# Patient Record
Sex: Male | Born: 1985 | Race: White | Hispanic: No | Marital: Married | Smoking: Never smoker
Health system: Southern US, Community
[De-identification: ages and names within clinical notes are randomized; demographics above are authoritative.]

## PROBLEM LIST (undated history)

## (undated) DIAGNOSIS — Z970 Presence of artificial eye: Secondary | ICD-10-CM

## (undated) DIAGNOSIS — F3181 Bipolar II disorder: Secondary | ICD-10-CM

## (undated) DIAGNOSIS — F32A Depression, unspecified: Secondary | ICD-10-CM

## (undated) DIAGNOSIS — R519 Headache, unspecified: Secondary | ICD-10-CM

## (undated) DIAGNOSIS — F419 Anxiety disorder, unspecified: Secondary | ICD-10-CM

## (undated) DIAGNOSIS — Z98811 Dental restoration status: Secondary | ICD-10-CM

## (undated) DIAGNOSIS — Z8711 Personal history of peptic ulcer disease: Secondary | ICD-10-CM

## (undated) DIAGNOSIS — Z8719 Personal history of other diseases of the digestive system: Secondary | ICD-10-CM

## (undated) DIAGNOSIS — T8859XA Other complications of anesthesia, initial encounter: Secondary | ICD-10-CM

## (undated) DIAGNOSIS — M654 Radial styloid tenosynovitis [de Quervain]: Secondary | ICD-10-CM

## (undated) DIAGNOSIS — F909 Attention-deficit hyperactivity disorder, unspecified type: Secondary | ICD-10-CM

## (undated) HISTORY — DX: Anxiety disorder, unspecified: F41.9

## (undated) HISTORY — PX: VASECTOMY: SHX75

## (undated) HISTORY — DX: Presence of artificial eye: Z97.0

## (undated) HISTORY — PX: BLEPHAROPLASTY: SUR158

## (undated) HISTORY — DX: Depression, unspecified: F32.A

---

## 2006-04-30 DIAGNOSIS — Z9889 Other specified postprocedural states: Secondary | ICD-10-CM | POA: Insufficient documentation

## 2006-04-30 HISTORY — PX: BUNIONECTOMY: SHX129

## 2013-10-23 DIAGNOSIS — F909 Attention-deficit hyperactivity disorder, unspecified type: Secondary | ICD-10-CM | POA: Insufficient documentation

## 2013-10-27 DIAGNOSIS — Z4420 Encounter for fitting and adjustment of artificial eye, unspecified: Secondary | ICD-10-CM | POA: Insufficient documentation

## 2015-08-02 DIAGNOSIS — Z0000 Encounter for general adult medical examination without abnormal findings: Secondary | ICD-10-CM | POA: Diagnosis not present

## 2015-08-02 DIAGNOSIS — Z13220 Encounter for screening for lipoid disorders: Secondary | ICD-10-CM | POA: Diagnosis not present

## 2015-08-02 DIAGNOSIS — M5412 Radiculopathy, cervical region: Secondary | ICD-10-CM | POA: Diagnosis not present

## 2015-08-02 DIAGNOSIS — K648 Other hemorrhoids: Secondary | ICD-10-CM | POA: Diagnosis not present

## 2015-08-02 DIAGNOSIS — N419 Inflammatory disease of prostate, unspecified: Secondary | ICD-10-CM | POA: Diagnosis not present

## 2015-08-11 DIAGNOSIS — D225 Melanocytic nevi of trunk: Secondary | ICD-10-CM | POA: Diagnosis not present

## 2015-08-19 DIAGNOSIS — M9901 Segmental and somatic dysfunction of cervical region: Secondary | ICD-10-CM | POA: Diagnosis not present

## 2015-08-19 DIAGNOSIS — M546 Pain in thoracic spine: Secondary | ICD-10-CM | POA: Diagnosis not present

## 2015-08-19 DIAGNOSIS — M542 Cervicalgia: Secondary | ICD-10-CM | POA: Diagnosis not present

## 2015-08-19 DIAGNOSIS — G44219 Episodic tension-type headache, not intractable: Secondary | ICD-10-CM | POA: Diagnosis not present

## 2015-08-20 DIAGNOSIS — G44219 Episodic tension-type headache, not intractable: Secondary | ICD-10-CM | POA: Diagnosis not present

## 2015-08-20 DIAGNOSIS — M9901 Segmental and somatic dysfunction of cervical region: Secondary | ICD-10-CM | POA: Diagnosis not present

## 2015-08-20 DIAGNOSIS — M542 Cervicalgia: Secondary | ICD-10-CM | POA: Diagnosis not present

## 2015-08-20 DIAGNOSIS — M546 Pain in thoracic spine: Secondary | ICD-10-CM | POA: Diagnosis not present

## 2015-08-22 DIAGNOSIS — M9901 Segmental and somatic dysfunction of cervical region: Secondary | ICD-10-CM | POA: Diagnosis not present

## 2015-08-22 DIAGNOSIS — M542 Cervicalgia: Secondary | ICD-10-CM | POA: Diagnosis not present

## 2015-08-22 DIAGNOSIS — M546 Pain in thoracic spine: Secondary | ICD-10-CM | POA: Diagnosis not present

## 2015-08-22 DIAGNOSIS — G44219 Episodic tension-type headache, not intractable: Secondary | ICD-10-CM | POA: Diagnosis not present

## 2015-08-23 DIAGNOSIS — G44219 Episodic tension-type headache, not intractable: Secondary | ICD-10-CM | POA: Diagnosis not present

## 2015-08-23 DIAGNOSIS — M546 Pain in thoracic spine: Secondary | ICD-10-CM | POA: Diagnosis not present

## 2015-08-23 DIAGNOSIS — M9901 Segmental and somatic dysfunction of cervical region: Secondary | ICD-10-CM | POA: Diagnosis not present

## 2015-08-23 DIAGNOSIS — M542 Cervicalgia: Secondary | ICD-10-CM | POA: Diagnosis not present

## 2015-08-29 DIAGNOSIS — M9901 Segmental and somatic dysfunction of cervical region: Secondary | ICD-10-CM | POA: Diagnosis not present

## 2015-08-29 DIAGNOSIS — M542 Cervicalgia: Secondary | ICD-10-CM | POA: Diagnosis not present

## 2015-08-29 DIAGNOSIS — M546 Pain in thoracic spine: Secondary | ICD-10-CM | POA: Diagnosis not present

## 2015-08-29 DIAGNOSIS — G44219 Episodic tension-type headache, not intractable: Secondary | ICD-10-CM | POA: Diagnosis not present

## 2015-08-30 DIAGNOSIS — M9901 Segmental and somatic dysfunction of cervical region: Secondary | ICD-10-CM | POA: Diagnosis not present

## 2015-08-30 DIAGNOSIS — M542 Cervicalgia: Secondary | ICD-10-CM | POA: Diagnosis not present

## 2015-08-30 DIAGNOSIS — M546 Pain in thoracic spine: Secondary | ICD-10-CM | POA: Diagnosis not present

## 2015-08-30 DIAGNOSIS — G44219 Episodic tension-type headache, not intractable: Secondary | ICD-10-CM | POA: Diagnosis not present

## 2015-08-31 DIAGNOSIS — G44219 Episodic tension-type headache, not intractable: Secondary | ICD-10-CM | POA: Diagnosis not present

## 2015-08-31 DIAGNOSIS — M542 Cervicalgia: Secondary | ICD-10-CM | POA: Diagnosis not present

## 2015-08-31 DIAGNOSIS — M546 Pain in thoracic spine: Secondary | ICD-10-CM | POA: Diagnosis not present

## 2015-08-31 DIAGNOSIS — M9901 Segmental and somatic dysfunction of cervical region: Secondary | ICD-10-CM | POA: Diagnosis not present

## 2015-09-07 DIAGNOSIS — M438X2 Other specified deforming dorsopathies, cervical region: Secondary | ICD-10-CM | POA: Diagnosis not present

## 2015-09-07 DIAGNOSIS — M9901 Segmental and somatic dysfunction of cervical region: Secondary | ICD-10-CM | POA: Diagnosis not present

## 2015-09-07 DIAGNOSIS — M542 Cervicalgia: Secondary | ICD-10-CM | POA: Diagnosis not present

## 2015-09-07 DIAGNOSIS — M530 Cervicocranial syndrome: Secondary | ICD-10-CM | POA: Diagnosis not present

## 2015-09-08 DIAGNOSIS — M438X2 Other specified deforming dorsopathies, cervical region: Secondary | ICD-10-CM | POA: Diagnosis not present

## 2015-09-08 DIAGNOSIS — M542 Cervicalgia: Secondary | ICD-10-CM | POA: Diagnosis not present

## 2015-09-08 DIAGNOSIS — M9901 Segmental and somatic dysfunction of cervical region: Secondary | ICD-10-CM | POA: Diagnosis not present

## 2015-09-08 DIAGNOSIS — M530 Cervicocranial syndrome: Secondary | ICD-10-CM | POA: Diagnosis not present

## 2016-06-28 DIAGNOSIS — R41840 Attention and concentration deficit: Secondary | ICD-10-CM | POA: Diagnosis not present

## 2016-06-28 DIAGNOSIS — F902 Attention-deficit hyperactivity disorder, combined type: Secondary | ICD-10-CM | POA: Diagnosis not present

## 2016-06-28 DIAGNOSIS — Z79899 Other long term (current) drug therapy: Secondary | ICD-10-CM | POA: Diagnosis not present

## 2016-06-28 DIAGNOSIS — F419 Anxiety disorder, unspecified: Secondary | ICD-10-CM | POA: Diagnosis not present

## 2016-08-23 DIAGNOSIS — Z79899 Other long term (current) drug therapy: Secondary | ICD-10-CM | POA: Diagnosis not present

## 2016-08-23 DIAGNOSIS — R41840 Attention and concentration deficit: Secondary | ICD-10-CM | POA: Diagnosis not present

## 2016-10-21 DIAGNOSIS — L259 Unspecified contact dermatitis, unspecified cause: Secondary | ICD-10-CM | POA: Diagnosis not present

## 2016-11-21 DIAGNOSIS — F902 Attention-deficit hyperactivity disorder, combined type: Secondary | ICD-10-CM | POA: Diagnosis not present

## 2016-11-21 DIAGNOSIS — Z79899 Other long term (current) drug therapy: Secondary | ICD-10-CM | POA: Diagnosis not present

## 2016-12-07 DIAGNOSIS — H02122 Mechanical ectropion of right lower eyelid: Secondary | ICD-10-CM | POA: Diagnosis not present

## 2016-12-17 DIAGNOSIS — F321 Major depressive disorder, single episode, moderate: Secondary | ICD-10-CM | POA: Diagnosis not present

## 2017-01-03 DIAGNOSIS — Z4421 Encounter for fitting and adjustment of artificial right eye: Secondary | ICD-10-CM | POA: Diagnosis not present

## 2017-01-18 DIAGNOSIS — H02132 Senile ectropion of right lower eyelid: Secondary | ICD-10-CM | POA: Diagnosis not present

## 2017-01-18 DIAGNOSIS — H02122 Mechanical ectropion of right lower eyelid: Secondary | ICD-10-CM | POA: Diagnosis not present

## 2017-02-07 ENCOUNTER — Encounter: Payer: Self-pay | Admitting: Physician Assistant

## 2017-02-07 ENCOUNTER — Ambulatory Visit (INDEPENDENT_AMBULATORY_CARE_PROVIDER_SITE_OTHER): Payer: BLUE CROSS/BLUE SHIELD | Admitting: Physician Assistant

## 2017-02-07 VITALS — BP 120/80 | HR 80 | Temp 98.3°F | Ht 68.0 in | Wt 198.2 lb

## 2017-02-07 DIAGNOSIS — R1032 Left lower quadrant pain: Secondary | ICD-10-CM | POA: Diagnosis not present

## 2017-02-07 DIAGNOSIS — Z114 Encounter for screening for human immunodeficiency virus [HIV]: Secondary | ICD-10-CM

## 2017-02-07 DIAGNOSIS — R1030 Lower abdominal pain, unspecified: Secondary | ICD-10-CM | POA: Diagnosis not present

## 2017-02-07 LAB — CBC WITH DIFFERENTIAL/PLATELET
BASOS ABS: 0 10*3/uL (ref 0.0–0.1)
BASOS PCT: 0.4 % (ref 0.0–3.0)
EOS ABS: 0.2 10*3/uL (ref 0.0–0.7)
Eosinophils Relative: 2.8 % (ref 0.0–5.0)
HCT: 45 % (ref 39.0–52.0)
Hemoglobin: 15.4 g/dL (ref 13.0–17.0)
Lymphocytes Relative: 36.5 % (ref 12.0–46.0)
Lymphs Abs: 2.2 10*3/uL (ref 0.7–4.0)
MCHC: 34.2 g/dL (ref 30.0–36.0)
MCV: 84.1 fl (ref 78.0–100.0)
Monocytes Absolute: 0.5 10*3/uL (ref 0.1–1.0)
Monocytes Relative: 7.8 % (ref 3.0–12.0)
NEUTROS ABS: 3.2 10*3/uL (ref 1.4–7.7)
NEUTROS PCT: 52.5 % (ref 43.0–77.0)
PLATELETS: 256 10*3/uL (ref 150.0–400.0)
RBC: 5.35 Mil/uL (ref 4.22–5.81)
RDW: 13.2 % (ref 11.5–15.5)
WBC: 6.1 10*3/uL (ref 4.0–10.5)

## 2017-02-07 LAB — URINALYSIS, ROUTINE W REFLEX MICROSCOPIC
BILIRUBIN URINE: NEGATIVE
Hgb urine dipstick: NEGATIVE
KETONES UR: NEGATIVE
LEUKOCYTES UA: NEGATIVE
Nitrite: NEGATIVE
PH: 7 (ref 5.0–8.0)
RBC / HPF: NONE SEEN (ref 0–?)
TOTAL PROTEIN, URINE-UPE24: NEGATIVE
UROBILINOGEN UA: 0.2 (ref 0.0–1.0)
Urine Glucose: NEGATIVE
WBC, UA: NONE SEEN (ref 0–?)

## 2017-02-07 LAB — COMPREHENSIVE METABOLIC PANEL
ALT: 30 U/L (ref 0–53)
AST: 22 U/L (ref 0–37)
Albumin: 4.9 g/dL (ref 3.5–5.2)
Alkaline Phosphatase: 46 U/L (ref 39–117)
BILIRUBIN TOTAL: 0.7 mg/dL (ref 0.2–1.2)
BUN: 12 mg/dL (ref 6–23)
CALCIUM: 10 mg/dL (ref 8.4–10.5)
CHLORIDE: 103 meq/L (ref 96–112)
CO2: 29 meq/L (ref 19–32)
CREATININE: 1.15 mg/dL (ref 0.40–1.50)
GFR: 77.7 mL/min (ref >60.00)
GLUCOSE: 98 mg/dL (ref 70–99)
Potassium: 4.2 mEq/L (ref 3.5–5.1)
Sodium: 138 mEq/L (ref 135–145)
Total Protein: 7.6 g/dL (ref 6.0–8.3)

## 2017-02-07 MED ORDER — CIPROFLOXACIN HCL 500 MG PO TABS: 500.0000 mg | ORAL_TABLET | Freq: Two times a day (BID) | ORAL | 0 refills | Status: AC

## 2017-02-07 MED ORDER — METRONIDAZOLE 500 MG PO TABS
500.0000 mg | ORAL_TABLET | Freq: Three times a day (TID) | ORAL | 0 refills | Status: AC
Start: 2017-02-07 — End: 2017-02-17
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
## 2017-02-07 NOTE — Patient Instructions (Addendum)
It was great to meet you!  We will call you with your lab results.  Start the antibiotics. Avoid alcohol while on these antibiotics.  If you change your mind and would like imaging (CT scan), please let us know.   Diverticulitis Diverticulitis is inflammation or infection of small pouches in your colon that form when you have a condition called diverticulosis. The pouches in your colon are called diverticula. Your colon, or large intestine, is where water is absorbed and stool is formed. Complications of diverticulitis can include:  Bleeding.  Severe infection.  Severe pain.  Perforation of your colon.  Obstruction of your colon.  What are the causes? Diverticulitis is caused by bacteria. Diverticulitis happens when stool becomes trapped in diverticula. This allows bacteria to grow in the diverticula, which can lead to inflammation and infection. What increases the risk? People with diverticulosis are at risk for diverticulitis. Eating a diet that does not include enough fiber from fruits and vegetables may make diverticulitis more likely to develop. What are the signs or symptoms? Symptoms of diverticulitis may include:  Abdominal pain and tenderness. The pain is normally located on the left side of the abdomen, but may occur in other areas.  Fever and chills.  Bloating.  Cramping.  Nausea.  Vomiting.  Constipation.  Diarrhea.  Blood in your stool.  How is this diagnosed? Your health care provider will ask you about your medical history and do a physical exam. You may need to have tests done because many medical conditions can cause the same symptoms as diverticulitis. Tests may include:  Blood tests.  Urine tests.  Imaging tests of the abdomen, including X-rays and CT scans.  When your condition is under control, your health care provider may recommend that you have a colonoscopy. A colonoscopy can show how severe your diverticula are and whether something  else is causing your symptoms. How is this treated? Most cases of diverticulitis are mild and can be treated at home. Treatment may include:  Taking over-the-counter pain medicines.  Following a clear liquid diet.  Taking antibiotic medicines by mouth for 7-10 days.  More severe cases may be treated at a hospital. Treatment may include:  Not eating or drinking.  Taking prescription pain medicine.  Receiving antibiotic medicines through an IV tube.  Receiving fluids and nutrition through an IV tube.  Surgery.  Follow these instructions at home:  Follow your health care provider's instructions carefully.  Follow a full liquid diet or other diet as directed by your health care provider. After your symptoms improve, your health care provider may tell you to change your diet. He or she may recommend you eat a high-fiber diet. Fruits and vegetables are good sources of fiber. Fiber makes it easier to pass stool.  Take fiber supplements or probiotics as directed by your health care provider.  Only take medicines as directed by your health care provider.  Keep all your follow-up appointments. Contact a health care provider if:  Your pain does not improve.  You have a hard time eating food.  Your bowel movements do not return to normal. Get help right away if:  Your pain becomes worse.  Your symptoms do not get better.  Your symptoms suddenly get worse.  You have a fever.  You have repeated vomiting.  You have bloody or black, tarry stools. This information is not intended to replace advice given to you by your health care provider. Make sure you discuss any questions you have with  your health care provider. Document Released: 01/24/2005 Document Revised: 09/22/2015 Document Reviewed: 03/11/2013 Elsevier Interactive Patient Education  2017 [employer].

## 2017-02-09 LAB — HIV ANTIBODY (ROUTINE TESTING W REFLEX): HIV 1&2 Ab, 4th Generation: NONREACTIVE

## 2017-02-11 ENCOUNTER — Telehealth: Payer: Self-pay | Admitting: *Deleted

## 2017-02-11 ENCOUNTER — Telehealth: Payer: Self-pay | Admitting: Physician Assistant

## 2017-02-11 NOTE — Telephone Encounter (Signed)
Patient called in reference to ciprofloxacin (CIPRO) 500 MG tablet and metroNIDAZOLE (FLAGYL) 500 MG tablet. Patient stated he is having side affects of not being able to sleep and also having diarrhea. Patient would like to know if he can take probiotics and sleeping meds with these as well or if they will interact. Please call patient and advise. Not ok to leave message on contact number.

## 2017-02-11 NOTE — Telephone Encounter (Signed)
Patient called and stated that Lelon Mast mention at his last appt about imaging. Patient states he is now interesting in imaging. He states he can be called when that appt is set up for radiology.  Patient is requesting a call back. His call back number is [phone]. Please advise. Thank you

## 2017-02-11 NOTE — Telephone Encounter (Signed)
Please see message and advise 

## 2017-02-11 NOTE — Telephone Encounter (Signed)
Please call patient and let him know that I have put in the CT scan order.  Also, please let him know I discussed with Dr. Earlene Plater and he can use immodium sparingly if needed. I do not recommend exceeding 16 mg/d. If any severe pain develops with use of immodium, please go to ER.  Jarold Motto PA-C 02/11/17

## 2017-02-11 NOTE — Telephone Encounter (Signed)
Probiotics are okay to take.  What sleeping meds is he wanting to take? (OTC sleep aid such as melatonin or benadryl is okay.)  Jarold Motto PA-C

## 2017-02-11 NOTE — Telephone Encounter (Signed)
Spoke to pt, told him Probiotics are okay but in regards to sleep aid OTC Melatonin and Benadryl is okay per [place]. Pt verbalized understanding and wanted to know if Unisom was okay and can he take Imodium? Told pt discussed with Lelon Mast and she said Unisom is okay but do not take Imodium she does not want you to get constipated need to push fluids. Pt verbalized understanding.

## 2017-02-11 NOTE — Telephone Encounter (Signed)
Left detailed message on personal voicemail, Mark Fritz ordered CT scan and someone will be contacting to schedule appt. Also, Mark Fritz discussed with Dr. Earlene Plater medication side effects and you can use immodium sparingly if needed. She does not recommend exceeding 16 mg/d. If any severe pain develops with use of immodium, please go to ER. Any questions please call the office.

## 2017-02-11 NOTE — Addendum Note (Signed)
Addended by: Haynes Bast on: 02/11/2017 11:45 AM   Modules accepted: Orders

## 2017-02-22 DIAGNOSIS — Z79899 Other long term (current) drug therapy: Secondary | ICD-10-CM | POA: Diagnosis not present

## 2017-02-22 DIAGNOSIS — F902 Attention-deficit hyperactivity disorder, combined type: Secondary | ICD-10-CM | POA: Diagnosis not present

## 2017-03-04 ENCOUNTER — Telehealth: Payer: Self-pay | Admitting: Physician Assistant

## 2017-03-04 NOTE — Telephone Encounter (Signed)
[facility] called in reference to authorization needed for the patient before their appointment on 03/06/17. I transferred the call to [facility]ic[facility] to advise. Mellody DanceKeith please call to give the PA.

## 2017-03-05 NOTE — Telephone Encounter (Signed)
Prior auth obtained and noted in referral. No further action needed.

## 2017-03-06 ENCOUNTER — Ambulatory Visit
Admission: RE | Admit: 2017-03-06 | Discharge: 2017-03-06 | Disposition: A | Discharge status: Home / Self Care | Payer: BLUE CROSS/BLUE SHIELD | Source: Ambulatory Visit | Attending: Physician Assistant | Admitting: Physician Assistant

## 2017-03-06 DIAGNOSIS — R590 Localized enlarged lymph nodes: Secondary | ICD-10-CM | POA: Diagnosis not present

## 2017-03-06 DIAGNOSIS — R1032 Left lower quadrant pain: Secondary | ICD-10-CM

## 2017-03-06 MED ORDER — IOPAMIDOL (ISOVUE-300) INJECTION 61%
100.0000 mL | Freq: Once | INTRAVENOUS | Status: AC | PRN
  Administered 2017-03-06: 100 mL via INTRAVENOUS

## 2017-03-07 ENCOUNTER — Encounter: Payer: Self-pay | Admitting: Physician Assistant

## 2017-03-07 DIAGNOSIS — I880 Nonspecific mesenteric lymphadenitis: Secondary | ICD-10-CM | POA: Insufficient documentation

## 2017-03-07 DIAGNOSIS — K5904 Chronic idiopathic constipation: Secondary | ICD-10-CM | POA: Insufficient documentation

## 2017-03-07 DIAGNOSIS — K5900 Constipation, unspecified: Secondary | ICD-10-CM | POA: Insufficient documentation

## 2017-03-19 ENCOUNTER — Encounter: Payer: Self-pay | Admitting: Sports Medicine

## 2017-03-19 ENCOUNTER — Ambulatory Visit (INDEPENDENT_AMBULATORY_CARE_PROVIDER_SITE_OTHER): Payer: BLUE CROSS/BLUE SHIELD | Admitting: Sports Medicine

## 2017-03-19 ENCOUNTER — Ambulatory Visit: Payer: Self-pay

## 2017-03-19 VITALS — BP 116/74 | HR 80 | Ht 68.0 in | Wt 198.4 lb

## 2017-03-19 DIAGNOSIS — M25531 Pain in right wrist: Secondary | ICD-10-CM

## 2017-03-19 DIAGNOSIS — M654 Radial styloid tenosynovitis [de Quervain]: Secondary | ICD-10-CM | POA: Insufficient documentation

## 2017-03-19 NOTE — Patient Instructions (Signed)

## 2017-03-19 NOTE — Assessment & Plan Note (Signed)
Injection today.  Continue with thumb spica splinting he did have a slight linear tear that likely contributed to this but no significant subluxation.  Avoid exacerbating activities and will plan for repeat ultrasound at follow-up to ensure clinical improvement.

## 2017-03-19 NOTE — Procedures (Signed)
PROCEDURE NOTE -  ULTRASOUND GUIDEDINJECTION: Right first dorsal compartment injection Images were obtained and interpreted by myself, Gaspar BiddingMichael Johncarlo Maalouf, DO  Images have been saved and stored to PACS system. Images obtained on: GE S7 Ultrasound machine  ULTRASOUND FINDINGS: Marked halo sign of the first dorsal compartment.  Tendon does have glide remaining however it is slightly dysfunctional.  DESCRIPTION OF PROCEDURE:  The patient's clinical condition is marked by substantial pain and/or significant functional disability. Other conservative therapy has not provided relief, is contraindicated, or not appropriate. There is a reasonable likelihood that injection will significantly improve the patient's pain and/or functional impairment. After discussing the risks, benefits and expected outcomes of the injection and all questions were reviewed and answered, the patient wished to undergo the above named procedure. Verbal consent was obtained. The ultrasound was used to identify the target structure and adjacent neurovascular structures. The skin was then prepped in sterile fashion and the target structure was injected under direct visualization using sterile technique as below: PREP: Alcohol, Ethel Chloride APPROACH: Direct inplane, single injection, 25g 1.5" needle INJECTATE: 0.5cc 1% lidocaine, 0.5cc 0.5% marcaine, 0.5cc 40mg  DepoMedrol ASPIRATE: N/A DRESSING: Band-Aid and the patient's own thumb spica brace  Post procedural instructions including recommending icing and warning signs for infection were reviewed. This procedure was well tolerated and there were no complications.   IMPRESSION: Succesful US Guided Injection

## 2017-03-19 NOTE — Progress Notes (Signed)
OFFICE VISIT NOTE Mark FellsMichael D. Delorise Shinerigby, DO  [facility] [place] [phone]  Mark Fritz - 31 y.o. male MRN [id number]  Date of birth: [birth year]  Visit Date: 03/19/2017  PCP: Mark MottoWorley, Samantha, PA   Referred by: Mark Fritz, [place]  Mark PierceMolly A Fritz PT, LAT, ATC acting as scribe for Dr. Berline Choughigby.  SUBJECTIVE:   Chief Complaint  Patient presents with  . New Patient (Initial Visit)    R wrist pain   HPI: As below and per problem based documentation when appropriate.  Mr. Mark Fritz is a new pt presenting today w/ c/o R wrist pain.  Pt states that his R wrist has been bothering him for a while, approximately one month.  He states that the pain began on the ulnar side of his wrist but now is bothering him more on the radial side.  Pt is currently wearing a wrist brace and he has been wearing that intermittently.  He notes that the wrist pain was exacerbated after [place]/ [place] a bunch of wood at his house.  Pt states that in addition to the brace he has tried ice and Advil which have also helped w/ the pain but not completely eliminated it.  Pt states that he currently has no pain in his R wrist but notes that, at it's worst, it is a sharp 8/10.  He also reports some popping and grinding in his R wrist.  Pt states that he is concerned about the possibility of deQuervain's tenosynovitis due to his dad having this and his symptoms sounding similar.      Review of Systems  Constitutional: Negative for chills and fever.  HENT: Negative.   Eyes: Negative.   Respiratory: Positive for cough. Negative for shortness of breath and wheezing.   Cardiovascular: Negative for chest pain and palpitations.  Gastrointestinal: Positive for heartburn. Negative for nausea.  Musculoskeletal: Positive for falls and joint pain.  Neurological: Negative for dizziness, tingling and headaches.  Endo/Heme/Allergies: Does not  bruise/bleed easily.  Psychiatric/Behavioral: Negative for depression. The patient is not nervous/anxious and does not have insomnia.     Otherwise per HPI.  HISTORY & PERTINENT PRIOR DATA:  No specialty comments available. He reports that  has never smoked. he has never used smokeless tobacco. No results for input(s): HGBA1C, LABURIC, CREATINE in the last 8760 hours.  Invalid input(s): CR Patient Active Problem List   Diagnosis Date Noted  . De Quervain's tenosynovitis, right 03/19/2017  . Mesenteric adenitis 03/07/2017  . Constipation 03/07/2017  . Fitting or adjustment of artificial eye 10/27/2013  . ADHD (attention deficit hyperactivity disorder) 10/23/2013   Past Medical History:  Diagnosis Date  . Frequent headaches   . GERD (gastroesophageal reflux disease)   . Hx of multiple concussions    x 2  . Prosthetic Left eye globe    Birth defect   Family History  Problem Relation Age of Onset  . Depression Mother   . Diabetes Mother   . Miscarriages / [place] Mother   . Hearing loss Father   . Hypertension Father   . Mental illness Maternal Grandmother   . Hypertension Paternal Grandfather   . Stroke Paternal Grandfather   . Colon cancer Neg Hx   . Prostate cancer Neg Hx    Past Surgical History:  Procedure Laterality Date  . BUNIONECTOMY Left 2008  . eyelid surugery Left 12/2016   Social History  Occupational History  . Not on file  Tobacco Use  . Smoking status: Never Smoker  . Smokeless tobacco: Never Used  Substance and Sexual Activity  . Alcohol use: No  . Drug use: No  . Sexual activity: Yes   No Known Allergies Outpatient Medications Prior to Visit  Medication Sig Dispense Refill  . EVEKEO 10 MG TABS Take 0.5-1 tablets by mouth daily.     Marland Kitchen ibuprofen (ADVIL,MOTRIN) 200 MG tablet Take 400 mg 3 (three) times daily by mouth.    Marland Kitchen acetaminophen (TYLENOL) 500 MG tablet Take 1,000 mg by mouth as needed.     No facility-administered medications prior  to visit.      OBJECTIVE:  VS:  HT:5\' 8"  (172.7 cm)   WT:198 lb 6.4 oz (90 kg)  BMI:30.17    BP:116/74  HR:80bpm  TEMP: ( )  RESP:96 % EXAM: Findings:  WDWN, NAD, Non-toxic appearing Alert & appropriately interactive Not depressed or anxious appearing No increased work of breathing. Pupils are equal. EOM intact without nystagmus No clubbing or cyanosis of the extremities appreciated No significant rashes/lesions/ulcerations overlying the examined area. Radial pulses 2+/4.  No significant generalized UE edema. Sensation intact to light touch in upper extremities.  Left wrist: Overall well aligned.  No significant deformity.  He does have pain over the first dorsal compartment.  Grip strength is intact.  Pain with [name] testing.  Overall good range of motion.  No significant anatomic snuffbox tenderness.    RADIOLOGY: 03/19/2017: First dorsal compartment injection, right wrist ASSESSMENT & PLAN:     ICD-10-CM   1. Right wrist pain M25.531 [place] LIMITED JOINT SPACE STRUCTURES UP RIGHT(NO LINKED CHARGES)  2. De Quervain's tenosynovitis, right M65.4    ================================================================= No problem-specific Assessment & Plan notes found for this encounter.  PROCEDURE NOTE -  ULTRASOUND GUIDEDINJECTION: Right first dorsal compartment injection Images were obtained and interpreted by myself, Gaspar Bidding, DO  Images have been saved and stored to PACS system. Images obtained on: GE S7 Ultrasound machine  ULTRASOUND FINDINGS: Marked halo sign of the first dorsal compartment.  Tendon does have glide remaining however it is slightly dysfunctional.  DESCRIPTION OF PROCEDURE:  The patient's clinical condition is marked by substantial pain and/or significant functional disability. Other conservative therapy has not provided relief, is contraindicated, or not appropriate. There is a reasonable likelihood that injection will significantly improve  the patient's pain and/or functional impairment. After discussing the risks, benefits and expected outcomes of the injection and all questions were reviewed and answered, the patient wished to undergo the above named procedure. Verbal consent was obtained. The ultrasound was used to identify the target structure and adjacent neurovascular structures. The skin was then prepped in sterile fashion and the target structure was injected under direct visualization using sterile technique as below: PREP: Alcohol, Ethel Chloride APPROACH: Direct inplane, single injection, 25g 1.5" needle INJECTATE: 0.5cc 1% lidocaine, 0.5cc 0.5% marcaine, 0.5cc 40mg  DepoMedrol ASPIRATE: N/A DRESSING: Band-Aid and the patient's own thumb spica brace  Post procedural instructions including recommending icing and warning signs for infection were reviewed. This procedure was well tolerated and there were no complications.   IMPRESSION: Succesful US Guided Injection   ================================================================= Patient Instructions  You had an injection today.  Things to be aware of after injection are listed below: . You may experience no significant improvement or even a slight worsening in your symptoms during the first 24 to 48 hours.  After that we expect your symptoms to improve  gradually over the next 2 weeks for the medicine to have its maximal effect.  You should continue to have improvement out to 6 weeks after your injection. . Dr. Berline Choughigby recommends icing the site of the injection for 20 minutes  1-2 times the day of your injection . You may shower but no swimming, tub bath or Jacuzzi for 24 hours. . If your bandage falls off this does not need to be replaced.  It is appropriate to remove the bandage after 4 hours. . You may resume light activities as tolerated unless otherwise directed per Dr. Berline Choughigby during your visit  POSSIBLE STEROID SIDE EFFECTS:  Side effects from injectable steroids  tend to be less than when taken orally however you may experience some of the symptoms listed below.  If experienced these should only last for a short period of time. Change in menstrual flow  Edema (swelling)  Increased appetite Skin flushing (redness)  Skin rash/acne  Thrush (oral) Yeast vaginitis    Increased sweating  Depression Increased blood glucose levels Cramping and leg/calf  Euphoria (feeling happy)  POSSIBLE PROCEDURE SIDE EFFECTS: The side effects of the injection are usually fairly minimal however if you may experience some of the following side effects that are usually self-limited and will is off on their own.  If you are concerned please feel free to call the office with questions:  Increased numbness or tingling  Nausea or vomiting  Swelling or bruising at the injection site   Please call our office if if you experience any of the following symptoms over the next week as these can be signs of infection:   Fever greater than 100.68F  Significant swelling at the injection site  Significant redness or drainage from the injection site  If after 2 weeks you are continuing to have worsening symptoms please call our office to discuss what the next appropriate actions should be including the potential for a return office visit or other diagnostic testing.     =================================================================  Follow-up: Return in about 6 weeks (around 04/30/2017).   CMA/ATC served as Neurosurgeonscribe during this visit. History, Physical, and Plan performed by medical provider. Documentation and orders reviewed and attested to.      Gaspar BiddingMichael Illa Enlow, DO    [facility]

## 2017-05-01 ENCOUNTER — Ambulatory Visit (INDEPENDENT_AMBULATORY_CARE_PROVIDER_SITE_OTHER): Payer: BLUE CROSS/BLUE SHIELD | Admitting: Sports Medicine

## 2017-05-01 ENCOUNTER — Ambulatory Visit: Payer: Self-pay

## 2017-05-01 ENCOUNTER — Encounter: Payer: Self-pay | Admitting: Sports Medicine

## 2017-05-01 VITALS — BP 120/80 | HR 78 | Ht 68.0 in | Wt 199.8 lb

## 2017-05-01 DIAGNOSIS — M654 Radial styloid tenosynovitis [de Quervain]: Secondary | ICD-10-CM

## 2017-05-01 MED ORDER — DICLOFENAC SODIUM 2 % TD SOLN: 1.0000 "application " | Freq: Two times a day (BID) | TRANSDERMAL | 2 refills | Status: DC

## 2017-05-01 MED ORDER — DICLOFENAC SODIUM 2 % TD SOLN: 1.0000 "application " | Freq: Two times a day (BID) | TRANSDERMAL | 0 refills | Status: AC

## 2017-05-01 NOTE — Progress Notes (Addendum)
Mark Fritz. [name] [phone]  [name] - 32 y.o. male MRN [id number]  Date of birth: 1985-05-05  Visit Date: 05/01/2017  PCP: Jarold Motto, PA   Referred by: Jarold Motto, [place]   Scribe for today's visit: Stevenson Clinch, CMA     SUBJECTIVE:  Mark Fritz is here for Follow-up [name] Quarvains tenosynovitis)  Compared to the last office visit, his previously described symptoms are improving, occasional pain or "twinge" with overuse, otherwise doing f=very well.  Current symptoms are mild & are nonradiating He has been icing, wearing wrist brace and taking Advil prn.    ROS Denies night time disturbances. Denies fevers, chills, or night sweats. Denies unexplained weight loss. Denies personal history of cancer. Denies changes in bowel or bladder habits. Denies recent unreported falls. Denies new or worsening dyspnea or wheezing. Denies headaches or dizziness.  Denies numbness, tingling or weakness  In the extremities.  Denies dizziness or presyncopal episodes Denies lower extremity edema     HISTORY & PERTINENT PRIOR DATA:  Prior History reviewed and updated per electronic medical record.  Significant history, findings, studies and interim changes include:  reports that  has never smoked. he has never used smokeless tobacco. No results for input(s): HGBA1C, LABURIC, CREATINE in the last 8760 hours. No specialty comments available. Problem  De Quervain's Tenosynovitis, Right    OBJECTIVE:  VS:  HT:5\' 8"  (172.7 cm)   WT:199 lb 12.8 oz (90.6 kg)  BMI:30.39    BP:120/80  HR:78bpm  TEMP: ( )  RESP:95 %   HISTORY & PERTINENT PRIOR DATA:  Prior History reviewed and updated per electronic medical record.  Significant history, findings, studies and interim changes include:  reports that  has never smoked. he has never used smokeless tobacco. No results for input(s): HGBA1C, LABURIC, CREATINE in  the last 8760 hours. No specialty comments available. Problem  De Quervain's Tenosynovitis, Right     OBJECTIVE:  VS:  HT:5\' 8"  (172.7 cm)   WT:199 lb 12.8 oz (90.6 kg)  BMI:30.39    BP:120/80  HR:78bpm  TEMP: ( )  RESP:95 %   PHYSICAL EXAM: Constitutional: WDWN, Non-toxic appearing. Psychiatric: Alert & appropriately interactive. Not depressed or anxious appearing. Respiratory: No increased work of breathing. Trachea Midline Cardiovascular:  Peripheral Pulses: peripheral pulses symmetrical No clubbing or cyanosis appreciated Capillary Refill is normal, less than 2 seconds No signficant generalized edema/anasarca Sensory Exam: intact to light touch   Bilateral upper extremities overall well aligned.  No significant pain with palpation of the first dorsal compartment.  Wrist and thumb range of motion is normal bilaterally.  No significant pain with [name] testing today.  Grip strength is normal.  No thenar atrophy.  No scaphoid pain  ASSESSMENT & PLAN:   1. De Quervain's tenosynovitis, right    PLAN:   De Quervain's tenosynovitis, right Significantly better.  Still having intermittent flareups and does notice that with heavy lifting and seems to significantly worsen but only for short periods of time.  Given small amount of persistent fluid and findings on the left side we will go ahead and start him on Pennsaid and see how he does with this over the next 2 weeks.  We will plan to follow-up with him only as needed at this point given how well he is doing but if any acute flareups or worsening could consider repeat injection.   ++++++++++++++++++++++++++++++++++++++++++++ Orders & Meds: Orders Placed This Encounter  Procedures  . US LIMITED JOINT SPACE STRUCTURES UP RIGHT(NO LINKED CHARGES)    Meds ordered this encounter  Medications  . Diclofenac Sodium (PENNSAID) 2 % SOLN    Sig: Place 1 application onto the skin 2 (two) times daily for 1 day.    Dispense:  8 g     Refill:  0  . Diclofenac Sodium (PENNSAID) 2 % SOLN    Sig: Place 1 application onto the skin 2 (two) times daily.    Dispense:  112 g    Refill:  2    Home Phone      [phone] Work Phone      [phone] Mobile          [phone]     ++++++++++++++++++++++++++++++++++++++++++++ Follow-up: Return if symptoms worsen or fail to improve.   Pertinent documentation may be included in additional procedure notes, imaging studies, problem based documentation and patient instructions. Please see these sections of the encounter for additional information regarding this visit. CMA/ATC served as Neurosurgeonscribe during this visit. History, Physical, and Plan performed by medical provider. Documentation and orders reviewed and attested to.      Andrena MewsMichael D Savannha Welle, DO    [facility]

## 2017-05-01 NOTE — Procedures (Signed)
LIMITED MSK ULTRASOUND OF bilateral wrists Images were obtained and interpreted by myself, Gaspar BiddingMichael Cniyah Sproull, DO  Images have been saved and stored to PACS system. Images obtained on: GE S7 Ultrasound machine  FINDINGS:   Right first dorsal compartment has a small amount of persistent swelling and edema with slight nodularity over the radial styloid  Left first dorsal compartment with a small halo sign that is minimal.  Somewhat limited glide on the left compared to the right which is overall significantly improved.  IMPRESSION:  1. Bilateral de Quervain's tenosynovitis, right greater than left

## 2017-05-01 NOTE — Assessment & Plan Note (Signed)
Significantly better.  Still having intermittent flareups and does notice that with heavy lifting and seems to significantly worsen but only for short periods of time.  Given small amount of persistent fluid and findings on the left side we will go ahead and start him on Pennsaid and see how he does with this over the next 2 weeks.  We will plan to follow-up with him only as needed at this point given how well he is doing but if any acute flareups or worsening could consider repeat injection.

## 2017-05-07 DIAGNOSIS — Z4421 Encounter for fitting and adjustment of artificial right eye: Secondary | ICD-10-CM | POA: Diagnosis not present

## 2017-07-02 DIAGNOSIS — F902 Attention-deficit hyperactivity disorder, combined type: Secondary | ICD-10-CM | POA: Diagnosis not present

## 2017-07-02 DIAGNOSIS — Z79899 Other long term (current) drug therapy: Secondary | ICD-10-CM | POA: Diagnosis not present

## 2017-07-10 ENCOUNTER — Ambulatory Visit (INDEPENDENT_AMBULATORY_CARE_PROVIDER_SITE_OTHER): Payer: BLUE CROSS/BLUE SHIELD

## 2017-07-10 ENCOUNTER — Ambulatory Visit (INDEPENDENT_AMBULATORY_CARE_PROVIDER_SITE_OTHER): Payer: BLUE CROSS/BLUE SHIELD | Admitting: Sports Medicine

## 2017-07-10 ENCOUNTER — Ambulatory Visit: Payer: Self-pay

## 2017-07-10 ENCOUNTER — Encounter: Payer: Self-pay | Admitting: Sports Medicine

## 2017-07-10 VITALS — BP 110/82 | HR 74 | Ht 68.0 in | Wt 195.2 lb

## 2017-07-10 DIAGNOSIS — M654 Radial styloid tenosynovitis [de Quervain]: Secondary | ICD-10-CM | POA: Diagnosis not present

## 2017-07-10 DIAGNOSIS — M1811 Unilateral primary osteoarthritis of first carpometacarpal joint, right hand: Secondary | ICD-10-CM

## 2017-07-10 DIAGNOSIS — M25531 Pain in right wrist: Secondary | ICD-10-CM

## 2017-07-10 LAB — COMPREHENSIVE METABOLIC PANEL
ALBUMIN: 4.8 g/dL (ref 3.5–5.2)
ALT: 30 U/L (ref 0–53)
AST: 21 U/L (ref 0–37)
Alkaline Phosphatase: 49 U/L (ref 39–117)
BILIRUBIN TOTAL: 0.5 mg/dL (ref 0.2–1.2)
BUN: 18 mg/dL (ref 6–23)
CO2: 30 mEq/L (ref 19–32)
CREATININE: 1.04 mg/dL (ref 0.40–1.50)
Calcium: 10 mg/dL (ref 8.4–10.5)
Chloride: 101 mEq/L (ref 96–112)
GFR: 87.04 mL/min (ref >60.00)
GLUCOSE: 107 mg/dL — AB (ref 70–99)
POTASSIUM: 4.2 meq/L (ref 3.5–5.1)
SODIUM: 138 meq/L (ref 135–145)
Total Protein: 7.5 g/dL (ref 6.0–8.3)

## 2017-07-10 LAB — CBC WITH DIFFERENTIAL/PLATELET
BASOS ABS: 0 10*3/uL (ref 0.0–0.1)
Basophils Relative: 0.6 % (ref 0.0–3.0)
EOS ABS: 0.1 10*3/uL (ref 0.0–0.7)
Eosinophils Relative: 1.9 % (ref 0.0–5.0)
HCT: 45 % (ref 39.0–52.0)
HEMOGLOBIN: 15.6 g/dL (ref 13.0–17.0)
LYMPHS ABS: 2.1 10*3/uL (ref 0.7–4.0)
Lymphocytes Relative: 38.5 % (ref 12.0–46.0)
MCHC: 34.7 g/dL (ref 30.0–36.0)
MCV: 83.5 fl (ref 78.0–100.0)
Monocytes Absolute: 0.4 10*3/uL (ref 0.1–1.0)
Monocytes Relative: 6.9 % (ref 3.0–12.0)
NEUTROS PCT: 52.1 % (ref 43.0–77.0)
Neutro Abs: 2.8 10*3/uL (ref 1.4–7.7)
Platelets: 250 10*3/uL (ref 150.0–400.0)
RBC: 5.39 Mil/uL (ref 4.22–5.81)
RDW: 13 % (ref 11.5–15.5)
WBC: 5.4 10*3/uL (ref 4.0–10.5)

## 2017-07-10 LAB — URIC ACID: URIC ACID, SERUM: 6.1 mg/dL (ref 4.0–7.8)

## 2017-07-10 NOTE — Progress Notes (Signed)
  OBJECTIVE:  VS:  HT:5\' 8"  (172.7 cm)   WT:195 lb 3.2 oz (88.5 kg)  BMI:29.69    BP:110/82  HR:74bpm  TEMP: ( )  RESP:96 %   PHYSICAL EXAM: WDWN, Non-toxic appearing. Psychiatric: Alert & appropriately interactive.  Not depressed or anxious appearing. Respiratory: No increased work of breathing.  Trachea Midline Eyes: Pupils are equal.  EOM intact without nystagmus.  No scleral icterus  NEUROVASCULAR exam: No clubbing or cyanosis appreciated No significant venous stasis changes normal, less than 2 seconds   Right thumb is overall well aligned.  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  No significant pain over the radial styloid.  Mild pain with Lourena SimmondsFinkelstein testing but this is not the localization of his current significant discomfort.  Radial pulses 2+/4.  No significant nail changes.  Grip strength is intact.  ASSESSMENT & PLAN:   1. De Quervain's tenosynovitis, right   2. Right wrist pain   3. Primary osteoarthritis of first carpometacarpal joint of right hand    Orders & Meds:  Orders Placed This Encounter  Procedures  . US MSK POCT ULTRASOUND  . XR Wrist Complete Right  . Comprehensive metabolic panel  . CBC with Differential/Platelet  . Uric acid   No orders of the defined types were placed in this encounter.   PLAN: Intra-articular injection today.  I believe he may have a small joint effusion with possible gouty contribution given the slight starry night appearance.  He did report at the end of the visit that he did have an episode several months ago where he was trying to bench press and felt an acute pain at the [facility] and symptoms have been seemingly worsening since that time.  I suspect a small capsular strain.  No significant evidence of ligamentous instability from side to side but he is hypermobile.  If any lack of improvement further diagnostic evaluation with MRI will be indicated but at this time  x-ray to ensure no significant bony abnormalities but suspect this will be normal.  Labs as above as well.  Follow-up as needed and we will plan on calling with results.  No problem-specific Assessment & Plan notes found for this encounter.   Follow-up: Return for as needed for ongoing issues.

## 2017-07-10 NOTE — Procedures (Signed)
[facility] Interpreted by myself [name](Fredda Clarida D Olney Monier, DO) during office visit.  Results were reviewed with the patient at the time of the visit.   3 VIEW X-RAY of: Right hand  FINDINGS:   Wrist is overall well aligned.  No significant bony abnormalities.  Scapholunate interval is normal.  Small amount of soft tissue edema at the [facility] joint but only mild spurring of the [facility] joint. IMPRESSION:  1. Normal x-ray of the right wrist with possible small osteophytic spurs of the right CMC joint.

## 2017-07-10 NOTE — Patient Instructions (Signed)

## 2017-07-10 NOTE — Procedures (Signed)
PROCEDURE NOTE:  Ultrasound Guided: Injection: 1st cmc joint inj Images were obtained and interpreted by myself, Gaspar BiddingMichael Wiatt Mahabir, DO  Images have been saved and stored to PACS system. Images obtained on: GE S7 Ultrasound machine  ULTRASOUND FINDINGS:  Joint effusion of the [facility] joint with a small amount of osteophytic spurring and possible starry night appearance although not classic.  Small amount of stenosis of the first dorsal compartment however improved compared in the past and overall good tendon glide appreciated.  DESCRIPTION OF PROCEDURE:  The patient's clinical condition is marked by substantial pain and/or significant functional disability. Other conservative therapy has not provided relief, is contraindicated, or not appropriate. There is a reasonable likelihood that injection will significantly improve the patient's pain and/or functional impairment.  After discussing the risks, benefits and expected outcomes of the injection and all questions were reviewed and answered, the patient wished to undergo the above named procedure. Verbal consent was obtained.  The ultrasound was used to identify the target structure and adjacent neurovascular structures. The skin was then prepped in sterile fashion and the target structure was injected under direct visualization using sterile technique as below:  PREP: Alcohol, Ethel Chloride  APPROACH: direct, single injection, 25g 1.5in.  INJECTATE: 0.5cc: 1% lidocaine, 0.5cc: 0.5% marcaine, 0.5cc: 40mg /mL DepoMedrol   ASPIRATE: None   DRESSING: Band-Aid   Post procedural instructions including recommending icing and warning signs for infection were reviewed.   This procedure was well tolerated and there were no complications.   IMPRESSION: Succesful Ultrasound Guided: Injection

## 2017-07-10 NOTE — Progress Notes (Signed)
  Veverly FellsMichael D. Delorise Shinerigby, DO  [facility] [place] [phone]  Mark Fritz - 32 y.o. male MRN [id number]  Date of birth: 1986-03-30  Visit Date: 07/10/2017  PCP: Mark Fritz, Samantha, PA   Referred by: Mark Fritz, Mark Fritz, [place]   Scribe for today's visit: Mark Fritz, CMA     SUBJECTIVE:  Mark BlightBryce Fritz is here for Follow-up (R wrist pain)  03/19/17: Mark Fritz is a new pt presenting today w/ c/o R wrist pain.  Pt states that his R wrist has been bothering him for a while, approximately one month.  He states that the pain began on the ulnar side of his wrist but now is bothering him more on the radial side.  Pt is currently wearing a wrist brace and he has been wearing that intermittently.  He notes that the wrist pain was exacerbated [place] of wood at his house.  Pt states that in addition to the brace he has tried ice and Advil which have also helped w/ the pain but not completely eliminated it.  Pt states that he currently has no pain in his R wrist but notes that, at it's worst, it is a sharp 8/10.  He also reports some popping and grinding in his R wrist.  Pt states that he is concerned about the possibility of deQuervain's tenosynovitis due to his dad having this and his symptoms sounding similar.  07/10/17: Compared to the last office visit, his previously described symptoms are worsening. Initially he did get some relief after injection in November but sx are returning. His pain is now mostly in the wrist, thumb, and more in the hand than the arm. He has noticed clicking and popping in the thumb.  Current symptoms are moderate & are nonradiating. He has noticed a feeling of warmth in his hand, this is a new sx.  He has been taking IBU prn for pain, icing prn, and wearing wrist brace at bedtime and sometimes with activity. The wrist hurts less when wearing the brace.     ROS Denies night time disturbances. Denies fevers, chills, or night sweats. Denies unexplained weight loss. Denies personal history of cancer. Denies changes in bowel or bladder habits. Denies recent unreported falls. Denies new or worsening dyspnea or wheezing. Denies headaches or dizziness.  Reports numbness, tingling or weakness  In the extremities.  Denies dizziness or presyncopal episodes Denies lower extremity edema    HISTORY & PERTINENT PRIOR DATA:  Prior History reviewed and updated per electronic medical record.  Significant history, findings, studies and interim changes include:  reports that  has never smoked. he has never used smokeless tobacco. Recent Labs    07/10/17 0859  LABURIC 6.1   No specialty comments available. No problems updated.     Please see additional documentation for Objective, Assessment and Plan sections. Pertinent additional documentation may be included in corresponding procedure notes, imaging studies, problem based documentation and patient instructions. Please see these sections of the encounter for additional information regarding this visit.  CMA/ATC served as Neurosurgeonscribe during this visit. History, Physical, and Plan performed by medical provider. Documentation and orders reviewed and attested to.      Mark MewsMichael D Rigby, DO    [facility] Physician

## 2017-07-17 ENCOUNTER — Encounter: Payer: Self-pay | Admitting: Sports Medicine

## 2017-07-25 ENCOUNTER — Other Ambulatory Visit: Payer: Self-pay

## 2017-07-25 DIAGNOSIS — M25531 Pain in right wrist: Secondary | ICD-10-CM

## 2017-07-25 NOTE — Progress Notes (Unsigned)
mr

## 2017-07-26 DIAGNOSIS — H02101 Unspecified ectropion of right upper eyelid: Secondary | ICD-10-CM | POA: Diagnosis not present

## 2017-07-26 DIAGNOSIS — H02102 Unspecified ectropion of right lower eyelid: Secondary | ICD-10-CM | POA: Diagnosis not present

## 2017-08-21 ENCOUNTER — Telehealth: Payer: Self-pay

## 2017-08-21 NOTE — Telephone Encounter (Signed)
Called and spoke to pt regarding brace for his R wrist/thumb and informed pt that he will need to make an appt in order to get his Exos splint made.  Pt said that he will be out of town all next week so he will keep his originally scheduled appt on 09/02/17 to f/u w/ Dr. Berline Choughigby and get his brace/splint made at that point.

## 2017-08-21 NOTE — Telephone Encounter (Signed)
Called and spoke to pt and informed him that his MRI has been authorized.  [facility] so he can call therm directly to schedule his MRI since they have not called him yet.  Suggested that it would make the most sense for him to combine his MRI results review appt w/ making the brace if possible but that the scheduling of both of these things w/i the same appt would depend on when he can get scheduled for his MRI.

## 2017-08-21 NOTE — Telephone Encounter (Signed)
Copied from CRM [phone]. Topic: Appointment Scheduling - Scheduling Inquiry for Clinic >> Aug 21, 2017 11:06 AM Landry MellowFoltz, Melissa J wrote: Reason for CRM: pt says that he needs to have a brace made for his arthritis and is unsure if he needs to have a md appt, or if someone else can make the brace. Please call pt back if he can be seen sooner than 05/06.  The number is [phone]  Pt is out of town week of April 29.

## 2017-08-21 NOTE — Telephone Encounter (Signed)
Copied from CRM #[id number]. Topic: Referral - Status >> Aug 21, 2017 11:16 AM Landry MellowFoltz, Melissa J wrote: Reason for CRM: pt is calling to see about the status of his mri that was ordered. He says he never got a call  Cb is [phone]

## 2017-09-02 ENCOUNTER — Ambulatory Visit: Payer: BLUE CROSS/BLUE SHIELD | Admitting: Sports Medicine

## 2017-09-02 ENCOUNTER — Other Ambulatory Visit: Payer: Self-pay | Admitting: Sports Medicine

## 2017-09-04 ENCOUNTER — Ambulatory Visit
Admission: RE | Admit: 2017-09-04 | Discharge: 2017-09-04 | Disposition: A | Discharge status: Home / Self Care | Payer: BLUE CROSS/BLUE SHIELD | Source: Ambulatory Visit | Attending: Sports Medicine | Admitting: Sports Medicine

## 2017-09-04 DIAGNOSIS — M25531 Pain in right wrist: Secondary | ICD-10-CM

## 2017-09-04 DIAGNOSIS — M19031 Primary osteoarthritis, right wrist: Secondary | ICD-10-CM | POA: Diagnosis not present

## 2017-09-04 MED ORDER — IOPAMIDOL (ISOVUE-M 200) INJECTION 41%
2.0000 mL | Freq: Once | INTRAMUSCULAR | Status: AC
  Administered 2017-09-04: 2 mL via INTRA_ARTICULAR

## 2017-09-04 MED ORDER — GADOBENATE DIMEGLUMINE 529 MG/ML IV SOLN
1.0000 mL | Freq: Once | INTRAVENOUS | Status: AC | PRN
  Administered 2017-09-04: 1 mL via INTRAVENOUS

## 2017-09-16 ENCOUNTER — Ambulatory Visit: Payer: BLUE CROSS/BLUE SHIELD | Admitting: Sports Medicine

## 2017-09-18 NOTE — Progress Notes (Signed)
Mark Fritz. [facility] at [facility] [phone]  Mark Fritz - 32 y.o. male MRN [id number]  Date of birth: 06-22-1985  Visit Date: 09/19/2017  PCP: Mark Motto, PA   Referred by: Mark Fritz, [place]  Scribe for today's visit: Stevenson Clinch, CMA     SUBJECTIVE:  Mark Fritz is here for Follow-up (R wrist pain)  03/19/17: Mark Fritz is a new pt presenting today w/ c/o R wrist pain.  Pt states that his R wrist has been bothering him for a while, approximately one month.  He states that the pain began on the ulnar side of his wrist but now is bothering him more on the radial side.  Pt is currently wearing a wrist brace and he has been wearing that intermittently.  He notes that the wrist pain was exacerbated [place]/ [place] of wood at his house.  Pt states that in addition to the brace he has tried ice and Advil which have also helped w/ the pain but not completely eliminated it.  Pt states that he currently has no pain in his R wrist but notes that, at it's worst, it is a sharp 8/10.  He also reports some popping and grinding in his R wrist.  Pt states that he is concerned about the possibility of deQuervain's tenosynovitis due to his dad having this and his symptoms sounding similar.  07/10/17: Compared to the last office visit, his previously described symptoms are worsening. Initially he did get some relief after injection in November but sx are returning. His pain is now mostly in the wrist, thumb, and more in the hand than the arm. He has noticed clicking and popping in the thumb.  Current symptoms are moderate & are nonradiating. He has noticed a feeling of warmth in his hand, this is a new sx.  He has been taking IBU prn for pain, icing prn, and wearing wrist brace at bedtime and sometimes with activity. The wrist hurts less when wearing the brace.    09/18/2017: Compared to the last office visit, his previously described symptoms are worsening. He has numbness on the dorsal aspect of the hand and he ha started noticing pain in his knuckles over the past few weeks.  Current symptoms are moderate-severe & are nonradiating He has been taking IBU and icing the hand prn. He has been wearing bracing at night but it seems to make the pain worse.   ROS Denies night time disturbances. Denies fevers, chills, or night sweats. Denies unexplained weight loss. Denies personal history of cancer. Denies changes in bowel or bladder habits. Denies recent unreported falls. Denies new or worsening dyspnea or wheezing. Denies headaches or dizziness.  Reports numbness, tingling or weakness  In the extremities.  Denies dizziness or presyncopal episodes Denies lower extremity edema    HISTORY & PERTINENT PRIOR DATA:  Prior History reviewed and updated per electronic medical record.  Significant/pertinent history, findings, studies include:  reports that he has never smoked. He has never used smokeless tobacco. Recent Labs    07/10/17 0859  LABURIC 6.1   No specialty comments available. Problem  De Quervain's Tenosynovitis, Right   MR Arthrogram Right Wrist - 09/05/17 - Mild degenerative changes of the TFCC.  1st dorsal compartment tenosynovitis is appreciated but was not commented on in the radiology read     OBJECTIVE:  VS:  HT:5\' 8"  (172.7  cm)   WT:203 lb 12.8 oz (92.4 kg)  BMI:30.99    BP:110/80  HR:86bpm  TEMP: ( )  RESP:96 %   PHYSICAL EXAM: Constitutional: WDWN, Non-toxic appearing. Psychiatric: Alert & appropriately interactive.  Not depressed or anxious appearing. Respiratory: No increased work of breathing.  Trachea Midline Eyes: Pupils are equal.  EOM intact without nystagmus.  No scleral icterus  Vascular Exam: warm to touch no edema  upper extremity neuro exam: unremarkable normal strength normal sensation  MSK  Exam: He is moderately tender directly over the radial styloid.  He has tightness and restrictions within the interosseous membrane of the left forearm.  Wrist flexion and extension strength is intact.  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  Minimal pain with [name] testing.   ASSESSMENT & PLAN:   1. De Quervain's tenosynovitis, right     PLAN: Reviewed the MRI in person today with him that did show some first dorsal compartment tenosynovitis but otherwise unremarkable over the radial aspect of his wrist.  Thumb spica XO's splint provided today improved comfort.  We will plan to follow-up with him in 4 weeks to ensure clinical improvement.  Topical OTC Aspercreme recommended to be used 4 times per day.  Avoid exacerbating activities.  Follow-up: Return in about 1 month (around 10/17/2017).      Please see additional documentation for Objective, Assessment and Plan sections. Pertinent additional documentation may be included in corresponding procedure notes, imaging studies, problem based documentation and patient instructions. Please see these sections of the encounter for additional information regarding this visit.  CMA/ATC served as Neurosurgeon during this visit. History, Physical, and Plan performed by medical provider. Documentation and orders reviewed and attested to.      [name], DO    [facility] Physician

## 2017-09-19 ENCOUNTER — Ambulatory Visit (INDEPENDENT_AMBULATORY_CARE_PROVIDER_SITE_OTHER): Payer: BLUE CROSS/BLUE SHIELD | Admitting: Sports Medicine

## 2017-09-19 ENCOUNTER — Encounter: Payer: Self-pay | Admitting: Sports Medicine

## 2017-09-19 DIAGNOSIS — M654 Radial styloid tenosynovitis [de Quervain]: Secondary | ICD-10-CM | POA: Diagnosis not present

## 2017-09-19 NOTE — Assessment & Plan Note (Signed)
Thumb spika EXOS cmc brace

## 2017-09-19 NOTE — Patient Instructions (Signed)
Try getting over the counter Aspercream

## 2017-09-20 ENCOUNTER — Encounter: Payer: Self-pay | Admitting: Sports Medicine

## 2017-09-24 ENCOUNTER — Encounter: Payer: Self-pay | Admitting: Sports Medicine

## 2017-09-24 ENCOUNTER — Ambulatory Visit: Payer: Self-pay

## 2017-09-24 ENCOUNTER — Ambulatory Visit (INDEPENDENT_AMBULATORY_CARE_PROVIDER_SITE_OTHER): Payer: BLUE CROSS/BLUE SHIELD | Admitting: Sports Medicine

## 2017-09-24 VITALS — BP 110/84 | HR 91 | Ht 68.0 in | Wt 203.6 lb

## 2017-09-24 DIAGNOSIS — M654 Radial styloid tenosynovitis [de Quervain]: Secondary | ICD-10-CM

## 2017-09-24 DIAGNOSIS — M25531 Pain in right wrist: Secondary | ICD-10-CM

## 2017-09-24 DIAGNOSIS — K219 Gastro-esophageal reflux disease without esophagitis: Secondary | ICD-10-CM | POA: Diagnosis not present

## 2017-09-24 MED ORDER — PANTOPRAZOLE SODIUM 40 MG PO TBEC: 40.0000 mg | DELAYED_RELEASE_TABLET | Freq: Every day | ORAL | 1 refills | Status: DC

## 2017-09-24 NOTE — Progress Notes (Signed)
Mark Fritz. Mark Fritz Sports Medicine [facility] at [facility] [phone]  Mark Fritz - 32 y.o. male MRN [id number]  Date of birth: Jan 26, 1986  Visit Date: 09/24/2017  PCP: Jarold Motto, PA   Referred by: Jarold Motto, [place]  Scribe for today's visit: Stevenson Clinch, CMA     SUBJECTIVE:  Mark Fritz is here for Follow-up Mark Fritz's tenosynovitis)  03/19/17: Mark Fritz is a new pt presenting today w/ c/o R wrist pain.  Pt states that his R wrist has been bothering him for a while, approximately one month.  He states that the pain began on the ulnar side of his wrist but now is bothering him more on the radial side.  Pt is currently wearing a wrist brace and he has been wearing that intermittently.  He notes that the wrist pain was exacerbated [place]/ [place] of wood at his house.  Pt states that in addition to the brace he has tried ice and Advil which have also helped w/ the pain but not completely eliminated it.  Pt states that he currently has no pain in his R wrist but notes that, at it's worst, it is a sharp 8/10.  He also reports some popping and grinding in his R wrist.  Pt states that he is concerned about the possibility of deQuervain's tenosynovitis due to his dad having this and his symptoms sounding similar.  07/10/17: Compared to the last office visit, his previously described symptoms are worsening. Initially he did get some relief after injection in November but sx are returning. His pain is now mostly in the wrist, thumb, and more in the hand than the arm. He has noticed clicking and popping in the thumb.  Current symptoms are moderate & are nonradiating. He has noticed a feeling of warmth in his hand, this is a new sx.  He has been taking IBU prn for pain, icing prn, and wearing wrist brace at bedtime and sometimes with activity. The wrist hurts less when wearing the brace.     09/18/2017: Compared to the last office visit, his previously described symptoms are worsening. He has numbness on the dorsal aspect of the hand and he ha started noticing pain in his knuckles over the past few weeks.  Current symptoms are moderate-severe & are nonradiating He has been taking IBU and icing the hand prn. He has been wearing bracing at night but it seems to make the pain worse.   09/24/2017: Compared to the last office visit, his previously described symptoms are improving (wrist pain).  Current symptoms are moderate & are nonradiating He has been wearing full wrist brace at night. He feels like he is fighting the braces when wearing them. He was taking IBU put it was causing GI upset/GERD to he hasn't taken it in the past 3 days. He has been wearing the EXOS when he is not typing or sleeping. He does find it to be beneficial while exercising.   ROS Denies night time disturbances. Denies fevers, chills, or night sweats. Denies unexplained weight loss. Denies personal history of cancer. Denies changes in bowel or bladder habits. Denies recent unreported falls. Denies new or worsening dyspnea or wheezing. Denies headaches or dizziness.  Reports numbness, tingling or weakness  In the extremities.  Denies dizziness or presyncopal episodes Denies lower extremity edema    HISTORY & PERTINENT PRIOR DATA:  Prior History reviewed and  updated per electronic medical record.  Significant/pertinent history, findings, studies include:  reports that he has never smoked. He has never used smokeless tobacco. Recent Labs    07/10/17 0859  LABURIC 6.1   No specialty comments available. No problems updated.  OBJECTIVE:  VS:  HT:5\' 8"  (172.7 cm)   WT:203 lb 9.6 oz (92.4 kg)  BMI:30.96    BP:110/84  HR:91bpm  TEMP: ( )  RESP:98 %   PHYSICAL EXAM: Constitutional: WDWN, Non-toxic appearing. Psychiatric: Alert & appropriately interactive.  Not depressed or anxious  appearing. Respiratory: No increased work of breathing.  Trachea Midline Eyes: Pupils are equal.  EOM intact without nystagmus.  No scleral icterus  Vascular Exam: warm to touch no edema  upper extremity neuro exam: unremarkable normal strength normal sensation  MSK Exam: TTP over distal radial styloid Marked pain with Finkl[place]in testing   ASSESSMENT & PLAN:   1. De Fritz's tenosynovitis, right   2. Right wrist pain   3. Gastroesophageal reflux disease, esophagitis presence not specified     PLAN:  Repeat injection today. Continue bracing.    NSAID induced gastritis rx for Protonix X 2 weeks instruction then PRN  Follow-up: Return for previously scheduled appointment..       Please see additional documentation for Objective, Assessment and Plan sections. Pertinent additional documentation may be included in corresponding procedure notes, imaging studies, problem based documentation and patient instructions. Please see these sections of the encounter for additional information regarding this visit.  CMA/ATC served as Neurosurgeon during this visit. History, Physical, and Plan performed by medical provider. Documentation and orders reviewed and attested to.      [name], DO    [facility] Physician

## 2017-09-24 NOTE — Patient Instructions (Signed)

## 2017-09-24 NOTE — Procedures (Signed)
PROCEDURE NOTE:  Ultrasound Guided: Injection: Right wrist, 1st dorsal compartment Images were obtained and interpreted by myself, Gaspar Bidding, DO  Images have been saved and stored to PACS system. Images obtained on: GE S7 Ultrasound machine    ULTRASOUND FINDINGS:  Significant tenosynovitis of the first dorsal compartment with positive halo sign.  DESCRIPTION OF PROCEDURE:  The patient's clinical condition is marked by substantial pain and/or significant functional disability. Other conservative therapy has not provided relief, is contraindicated, or not appropriate. There is a reasonable likelihood that injection will significantly improve the patient's pain and/or functional impairment.   After discussing the risks, benefits and expected outcomes of the injection and all questions were reviewed and answered, the patient wished to undergo the above named procedure.  Verbal consent was obtained.  The ultrasound was used to identify the target structure and adjacent neurovascular structures. The skin was then prepped in sterile fashion and the target structure was injected under direct visualization using sterile technique as below:  PREP: Alcohol, Ethel Chloride and 1 cc 1% lidocaine on Insulin Needle APPROACH: direct, single injection, 25g 1.5 in. INJECTATE: 0.5 cc 1% lidocaine, 0.5 cc 0.5% Marcaine and 0.5 cc /mL DepoMedrol ASPIRATE: None DRESSING: Band-Aid  Post procedural instructions including recommending icing and warning signs for infection were reviewed.    This procedure was well tolerated and there were no complications.   IMPRESSION: Succesful Ultrasound Guided: Injection

## 2017-10-04 DIAGNOSIS — F902 Attention-deficit hyperactivity disorder, combined type: Secondary | ICD-10-CM | POA: Diagnosis not present

## 2017-10-04 DIAGNOSIS — Z79899 Other long term (current) drug therapy: Secondary | ICD-10-CM | POA: Diagnosis not present

## 2017-10-07 ENCOUNTER — Ambulatory Visit (INDEPENDENT_AMBULATORY_CARE_PROVIDER_SITE_OTHER): Payer: BLUE CROSS/BLUE SHIELD | Admitting: Sports Medicine

## 2017-10-07 DIAGNOSIS — M654 Radial styloid tenosynovitis [de Quervain]: Secondary | ICD-10-CM | POA: Diagnosis not present

## 2017-10-07 DIAGNOSIS — M25531 Pain in right wrist: Secondary | ICD-10-CM

## 2017-10-07 NOTE — Progress Notes (Signed)
Mark FellsMichael D. Delorise Shinerigby, DO  [facility] [phone]  Mark BlightBryce Fritz - 32 y.o. male MRN [id number]  Date of birth: 1985-09-28  Visit Date: 10/07/2017  PCP: Jarold MottoWorley, Samantha, PA   Referred by: Jarold MottoWorley, Samantha, [place]  Scribe for today's visit: Stevenson ClinchBrandy Coleman, CMA     SUBJECTIVE:  Mark BlightBryce Fritz is here for Follow-up (R wrist pain)  03/19/17: Mark Fritz is a new pt presenting today w/ c/o R wrist pain.  Pt states that his R wrist has been bothering him for a while, approximately one month.  He states that the pain began on the ulnar side of his wrist but now is bothering him more on the radial side.  Pt is currently wearing a wrist brace and he has been wearing that intermittently.  He notes that the wrist pain was exacerbated [place] of wood at his [place].  Pt states that in addition to the brace he has tried ice and Advil which have also helped w/ the pain but not completely eliminated it.  Pt states that he currently has no pain in his R wrist but notes that, at it's worst, it is a sharp 8/10.  He also reports some popping and grinding in his R wrist.  Pt states that he is concerned about the possibility of deQuervain's tenosynovitis due to his dad having this and his symptoms sounding similar.  07/10/17: Compared to the last office visit, his previously described symptoms are worsening. Initially he did get some relief after injection in November but sx are returning. His pain is now mostly in the wrist, thumb, and more in the hand than the arm. He has noticed clicking and popping in the thumb.  Current symptoms are moderate & are nonradiating. He has noticed a feeling of warmth in his hand, this is a new sx.  He has been taking IBU prn for pain, icing prn, and wearing wrist brace at bedtime and sometimes with activity. The wrist hurts less when wearing the brace.    09/18/2017: Compared to the last office visit, his previously described symptoms are worsening. He has numbness on the dorsal aspect of the hand and he ha started noticing pain in his knuckles over the past few weeks.  Current symptoms are moderate-severe & are nonradiating He has been taking IBU and icing the hand prn. He has been wearing bracing at night but it seems to make the pain worse.   09/24/2017: Compared to the last office visit, his previously described symptoms are improving (wrist pain).  Current symptoms are moderate & are nonradiating He has been wearing full wrist brace at night. He feels like he is fighting the braces when wearing them. He was taking IBU put it was causing GI upset/GERD to he hasn't taken it in the past 3 days. He has been wearing the EXOS when he is not typing or sleeping. He does find it to be beneficial while exercising.   10/07/2017: Compared to the last office visit, his previously described symptoms are worsening, he has started to notice more clicking and popping on the dorsal aspect of the hand near the thumb. He has noticed some chaffing around his thumb from the brace rubbing.  Current symptoms are moderate & are nonradiating He has been wearing full wrist brace at night and EXOS during the day.   ROS Denies night time disturbances. Denies fevers, chills,  or night sweats. Denies unexplained weight loss. Denies personal history of cancer. Denies changes in bowel or bladder habits. Denies recent unreported falls. Denies new or worsening dyspnea or wheezing. Denies headaches or dizziness.  Reports numbness, tingling or weakness  In the extremities.  Denies dizziness or presyncopal episodes Denies lower extremity edema    HISTORY & PERTINENT PRIOR DATA:  Prior History reviewed and updated per electronic medical record.  Significant/pertinent history, findings, studies include:  reports that he has never smoked. He has never used smokeless  tobacco. Recent Labs    07/10/17 0859  LABURIC 6.1   No specialty comments available. No problems updated.  OBJECTIVE:  VS:  HT:    WT:   BMI:     BP:   HR: bpm  TEMP: ( )  RESP:    PHYSICAL EXAM: Constitutional: WDWN, Non-toxic appearing. Psychiatric: Alert & appropriately interactive.  Not depressed or anxious appearing. Respiratory: No increased work of breathing.  Trachea Midline Eyes: Pupils are equal.  EOM intact without nystagmus.  No scleral icterus  Vascular Exam: warm to touch no edema  upper extremity neuro exam: unremarkable normal strength normal sensation  MSK Exam: Right wrist - TTP over radial styloid. Slight triggering/snapping with [name] testing -    ASSESSMENT & PLAN:   1. De Quervain's tenosynovitis, right   2. Right wrist pain     PLAN: Ultimately his wrist is actually somewhat improved.  With improved tendon glide but this is actually triggering a believe that he is experiencing.  If any lack of improvement over the next 3 weeks surgical release will be indicated.  Follow-up: Return in about 3 weeks (around 10/28/2017).      Please see additional documentation for Objective, Assessment and Plan sections. Pertinent additional documentation may be included in corresponding procedure notes, imaging studies, problem based documentation and patient instructions. Please see these sections of the encounter for additional information regarding this visit.  CMA/ATC served as Neurosurgeon during this visit. History, Physical, and Plan performed by medical provider. Documentation and orders reviewed and attested to.      [name], DO    [place] [facility] Physician

## 2017-10-08 ENCOUNTER — Encounter: Payer: Self-pay | Admitting: Sports Medicine

## 2017-10-11 DIAGNOSIS — M9902 Segmental and somatic dysfunction of thoracic region: Secondary | ICD-10-CM | POA: Diagnosis not present

## 2017-10-11 DIAGNOSIS — M542 Cervicalgia: Secondary | ICD-10-CM | POA: Diagnosis not present

## 2017-10-11 DIAGNOSIS — M9901 Segmental and somatic dysfunction of cervical region: Secondary | ICD-10-CM | POA: Diagnosis not present

## 2017-10-11 DIAGNOSIS — M546 Pain in thoracic spine: Secondary | ICD-10-CM | POA: Diagnosis not present

## 2017-10-13 ENCOUNTER — Encounter: Payer: Self-pay | Admitting: Sports Medicine

## 2017-10-15 DIAGNOSIS — M9902 Segmental and somatic dysfunction of thoracic region: Secondary | ICD-10-CM | POA: Diagnosis not present

## 2017-10-15 DIAGNOSIS — M546 Pain in thoracic spine: Secondary | ICD-10-CM | POA: Diagnosis not present

## 2017-10-15 DIAGNOSIS — M9901 Segmental and somatic dysfunction of cervical region: Secondary | ICD-10-CM | POA: Diagnosis not present

## 2017-10-15 DIAGNOSIS — M542 Cervicalgia: Secondary | ICD-10-CM | POA: Diagnosis not present

## 2017-10-17 ENCOUNTER — Ambulatory Visit: Payer: BLUE CROSS/BLUE SHIELD | Admitting: Sports Medicine

## 2017-10-18 DIAGNOSIS — M542 Cervicalgia: Secondary | ICD-10-CM | POA: Diagnosis not present

## 2017-10-18 DIAGNOSIS — M546 Pain in thoracic spine: Secondary | ICD-10-CM | POA: Diagnosis not present

## 2017-10-18 DIAGNOSIS — M9902 Segmental and somatic dysfunction of thoracic region: Secondary | ICD-10-CM | POA: Diagnosis not present

## 2017-10-18 DIAGNOSIS — M9901 Segmental and somatic dysfunction of cervical region: Secondary | ICD-10-CM | POA: Diagnosis not present

## 2017-10-21 DIAGNOSIS — M542 Cervicalgia: Secondary | ICD-10-CM | POA: Diagnosis not present

## 2017-10-21 DIAGNOSIS — M546 Pain in thoracic spine: Secondary | ICD-10-CM | POA: Diagnosis not present

## 2017-10-21 DIAGNOSIS — M9902 Segmental and somatic dysfunction of thoracic region: Secondary | ICD-10-CM | POA: Diagnosis not present

## 2017-10-21 DIAGNOSIS — M9901 Segmental and somatic dysfunction of cervical region: Secondary | ICD-10-CM | POA: Diagnosis not present

## 2017-10-22 ENCOUNTER — Encounter: Payer: Self-pay | Admitting: Sports Medicine

## 2017-10-23 DIAGNOSIS — M546 Pain in thoracic spine: Secondary | ICD-10-CM | POA: Diagnosis not present

## 2017-10-23 DIAGNOSIS — M9901 Segmental and somatic dysfunction of cervical region: Secondary | ICD-10-CM | POA: Diagnosis not present

## 2017-10-23 DIAGNOSIS — M542 Cervicalgia: Secondary | ICD-10-CM | POA: Diagnosis not present

## 2017-10-23 DIAGNOSIS — M9902 Segmental and somatic dysfunction of thoracic region: Secondary | ICD-10-CM | POA: Diagnosis not present

## 2017-10-25 ENCOUNTER — Ambulatory Visit: Payer: Self-pay

## 2017-10-25 ENCOUNTER — Encounter: Payer: Self-pay | Admitting: Sports Medicine

## 2017-10-25 ENCOUNTER — Ambulatory Visit (INDEPENDENT_AMBULATORY_CARE_PROVIDER_SITE_OTHER): Payer: BLUE CROSS/BLUE SHIELD | Admitting: Sports Medicine

## 2017-10-25 VITALS — BP 110/78 | HR 94 | Ht 68.0 in | Wt 198.4 lb

## 2017-10-25 DIAGNOSIS — M9902 Segmental and somatic dysfunction of thoracic region: Secondary | ICD-10-CM | POA: Diagnosis not present

## 2017-10-25 DIAGNOSIS — M546 Pain in thoracic spine: Secondary | ICD-10-CM | POA: Diagnosis not present

## 2017-10-25 DIAGNOSIS — M25531 Pain in right wrist: Secondary | ICD-10-CM | POA: Diagnosis not present

## 2017-10-25 DIAGNOSIS — M654 Radial styloid tenosynovitis [de Quervain]: Secondary | ICD-10-CM | POA: Diagnosis not present

## 2017-10-25 DIAGNOSIS — M542 Cervicalgia: Secondary | ICD-10-CM | POA: Diagnosis not present

## 2017-10-25 DIAGNOSIS — M9901 Segmental and somatic dysfunction of cervical region: Secondary | ICD-10-CM | POA: Diagnosis not present

## 2017-10-25 NOTE — Progress Notes (Signed)
Mark Fritz D. Mark Shinerigby, DO  [facility] [place] [phone]  Mark Fritz - 32 y.o. male MRN [id number]  Date of birth: [birth year]  Visit Date: 10/25/2017  PCP: Mark Fritz, Samantha, PA   Referred by: Mark Fritz, Mark, GeorgiaPA  Scribe(s) for today's visit: Christoper FabianMolly Weber, LAT, ATC  SUBJECTIVE:  Mark Fritz is here for Follow-up (R wrist pain) .    03/19/17: Mr. Mark Fritz is a new pt presenting today w/ c/o R wrist pain.  Pt states that his R wrist has been bothering him for a while, approximately one month.  He states that the pain began on the ulnar side of his wrist but now is bothering him more on the radial side.  Pt is currently wearing a wrist brace and he has been wearing that intermittently.  He notes that the wrist pain was exacerbated [place] of wood at his house.  Pt states that in addition to the brace he has tried ice and Advil which have also helped w/ the pain but not completely eliminated it.  Pt states that he currently has no pain in his R wrist but notes that, at it's worst, it is a sharp 8/10.  He also reports some popping and grinding in his R wrist.  Pt states that he is concerned about the possibility of deQuervain's tenosynovitis due to his dad having this and his symptoms sounding similar.  07/10/17: Compared to the last office visit, his previously described symptoms are worsening. Initially he did get some relief after injection in November but sx are returning. His pain is now mostly in the wrist, thumb, and more in the hand than the arm. He has noticed clicking and popping in the thumb.  Current symptoms are moderate & are nonradiating. He has noticed a feeling of warmth in his hand, this is a new sx.  He has been taking IBU prn for pain, icing prn, and wearing wrist brace at bedtime and sometimes with activity. The wrist hurts less when wearing the brace.    09/18/2017: Compared to the last office visit, his previously described symptoms are worsening. He has numbness on the dorsal aspect of the hand and he ha started noticing pain in his knuckles over the past few weeks.  Current symptoms are moderate-severe & are nonradiating He has been taking IBU and icing the hand prn. He has been wearing bracing at night but it seems to make the pain worse.   09/24/2017: Compared to the last office visit, his previously described symptoms are improving (wrist pain).  Current symptoms are moderate & are nonradiating He has been wearing full wrist brace at night. He feels like he is fighting the braces when wearing them. He was taking IBU put it was causing GI upset/GERD to he hasn't taken it in the past 3 days. He has been wearing the EXOS when he is not typing or sleeping. He does find it to be beneficial while exercising.   10/07/2017: Compared to the last office visit, his previously described symptoms are worsening, he has started to notice more clicking and popping on the dorsal aspect of the hand near the thumb. He has noticed some chaffing around his thumb from the brace rubbing.  Current symptoms are moderate & are nonradiating He has been wearing full wrist brace at night and EXOS during the day.   10/25/17: Compared to the last office visit  on 10/07/17, his previously described R wrist pain symptoms show slight improvement.  He does notice increased triggering at the base of thumb but his pain is less.  He states that sometimes he has less pain when he doesn't wear the brace. Current symptoms are mild & are nonradiating. He has been wearing his full wrist brace at night and his EXOS splint during the day.   REVIEW OF SYSTEMS: Denies night time disturbances. Denies fevers, chills, or night sweats. Denies unexplained weight loss. Denies personal history of cancer. Denies changes in bowel or bladder habits. Denies recent unreported falls. Denies  new or worsening dyspnea or wheezing. Denies headaches or dizziness.  Reports numbness, tingling or weakness  In the extremities - in the R hand Denies dizziness or presyncopal episodes Denies lower extremity edema    HISTORY & PERTINENT PRIOR DATA:  Prior History reviewed and updated per electronic medical record.  Significant/pertinent history, findings, studies include:  reports that he has never smoked. He has never used smokeless tobacco. Recent Labs    07/10/17 0859  LABURIC 6.1   No specialty comments available. No problems updated.  OBJECTIVE:  VS:  HT:5\' 8"  (172.7 cm)   WT:198 lb 6.4 oz (90 kg)  BMI:30.17    BP:110/78  HR:94bpm  TEMP: ( )  RESP:96 %   PHYSICAL EXAM: Constitutional: WDWN, Non-toxic appearing. Psychiatric: Alert & appropriately interactive.  Not depressed or anxious appearing. Respiratory: No increased work of breathing.  Trachea Midline Eyes: Pupils are equal.  EOM intact without nystagmus.  No scleral icterus  Vascular Exam: warm to touch no edema  upper extremity neuro exam: normal strength normal sensation  MSK Exam: Right wrist is overall well aligned ligamentously stable.  He has good flexion extension of the thumb with good opposition and AB/adduction.  Negative Finkelstein testing.  Grip strength is intact.   ASSESSMENT & PLAN:   1. De Quervain's tenosynovitis, right   2. Right wrist pain     PLAN: Overall he is better.  The tendon does show improved tendon glide techniques.  He would like to continue with home therapeutic exercises that he found online that are working on range of motion as well as strengthening.  If any lack of improvement can consider referral to hand PT versus hand surgery for first dorsal compartment release.  No further injections planned or recommended at this time.  Follow-up: Return if symptoms worsen or fail to improve.      Please see additional documentation for Objective, Assessment and Plan  sections. Pertinent additional documentation may be included in corresponding procedure notes, imaging studies, problem based documentation and patient instructions. Please see these sections of the encounter for additional information regarding this visit.  CMA/ATC served as Neurosurgeon during this visit. History, Physical, and Plan performed by medical provider. Documentation and orders reviewed and attested to.      [name], DO    [facility]

## 2017-10-25 NOTE — Procedures (Signed)
LIMITED MSK ULTRASOUND OF Right 1st dorsal compartment Images were obtained and interpreted by myself, Gaspar BiddingMichael Rigby, DO  Images have been saved and stored to PACS system. Images obtained on: GE S7 Ultrasound machine  FINDINGS:   Thickening of the extensor retinaculum at the distal radial styloid but overall significantly improved tendon glide through the first dorsal compartment.  IMPRESSION:  1. Improving de Quervain's tenosynovitis with improved tendon glide.

## 2017-10-28 DIAGNOSIS — M9902 Segmental and somatic dysfunction of thoracic region: Secondary | ICD-10-CM | POA: Diagnosis not present

## 2017-10-28 DIAGNOSIS — M542 Cervicalgia: Secondary | ICD-10-CM | POA: Diagnosis not present

## 2017-10-28 DIAGNOSIS — M546 Pain in thoracic spine: Secondary | ICD-10-CM | POA: Diagnosis not present

## 2017-10-28 DIAGNOSIS — M9901 Segmental and somatic dysfunction of cervical region: Secondary | ICD-10-CM | POA: Diagnosis not present

## 2017-10-29 DIAGNOSIS — M546 Pain in thoracic spine: Secondary | ICD-10-CM | POA: Diagnosis not present

## 2017-10-29 DIAGNOSIS — M9902 Segmental and somatic dysfunction of thoracic region: Secondary | ICD-10-CM | POA: Diagnosis not present

## 2017-10-29 DIAGNOSIS — M9901 Segmental and somatic dysfunction of cervical region: Secondary | ICD-10-CM | POA: Diagnosis not present

## 2017-10-29 DIAGNOSIS — M542 Cervicalgia: Secondary | ICD-10-CM | POA: Diagnosis not present

## 2017-10-30 DIAGNOSIS — M9902 Segmental and somatic dysfunction of thoracic region: Secondary | ICD-10-CM | POA: Diagnosis not present

## 2017-10-30 DIAGNOSIS — M9901 Segmental and somatic dysfunction of cervical region: Secondary | ICD-10-CM | POA: Diagnosis not present

## 2017-10-30 DIAGNOSIS — M546 Pain in thoracic spine: Secondary | ICD-10-CM | POA: Diagnosis not present

## 2017-10-30 DIAGNOSIS — M542 Cervicalgia: Secondary | ICD-10-CM | POA: Diagnosis not present

## 2017-11-04 DIAGNOSIS — M9902 Segmental and somatic dysfunction of thoracic region: Secondary | ICD-10-CM | POA: Diagnosis not present

## 2017-11-04 DIAGNOSIS — M542 Cervicalgia: Secondary | ICD-10-CM | POA: Diagnosis not present

## 2017-11-04 DIAGNOSIS — M546 Pain in thoracic spine: Secondary | ICD-10-CM | POA: Diagnosis not present

## 2017-11-04 DIAGNOSIS — M9901 Segmental and somatic dysfunction of cervical region: Secondary | ICD-10-CM | POA: Diagnosis not present

## 2017-11-05 DIAGNOSIS — M542 Cervicalgia: Secondary | ICD-10-CM | POA: Diagnosis not present

## 2017-11-05 DIAGNOSIS — M546 Pain in thoracic spine: Secondary | ICD-10-CM | POA: Diagnosis not present

## 2017-11-05 DIAGNOSIS — M9901 Segmental and somatic dysfunction of cervical region: Secondary | ICD-10-CM | POA: Diagnosis not present

## 2017-11-05 DIAGNOSIS — M9902 Segmental and somatic dysfunction of thoracic region: Secondary | ICD-10-CM | POA: Diagnosis not present

## 2017-11-06 DIAGNOSIS — M9901 Segmental and somatic dysfunction of cervical region: Secondary | ICD-10-CM | POA: Diagnosis not present

## 2017-11-06 DIAGNOSIS — M9902 Segmental and somatic dysfunction of thoracic region: Secondary | ICD-10-CM | POA: Diagnosis not present

## 2017-11-06 DIAGNOSIS — M542 Cervicalgia: Secondary | ICD-10-CM | POA: Diagnosis not present

## 2017-11-06 DIAGNOSIS — M546 Pain in thoracic spine: Secondary | ICD-10-CM | POA: Diagnosis not present

## 2017-11-11 DIAGNOSIS — M546 Pain in thoracic spine: Secondary | ICD-10-CM | POA: Diagnosis not present

## 2017-11-11 DIAGNOSIS — M9901 Segmental and somatic dysfunction of cervical region: Secondary | ICD-10-CM | POA: Diagnosis not present

## 2017-11-11 DIAGNOSIS — M542 Cervicalgia: Secondary | ICD-10-CM | POA: Diagnosis not present

## 2017-11-11 DIAGNOSIS — M9902 Segmental and somatic dysfunction of thoracic region: Secondary | ICD-10-CM | POA: Diagnosis not present

## 2017-11-13 DIAGNOSIS — M542 Cervicalgia: Secondary | ICD-10-CM | POA: Diagnosis not present

## 2017-11-13 DIAGNOSIS — M9902 Segmental and somatic dysfunction of thoracic region: Secondary | ICD-10-CM | POA: Diagnosis not present

## 2017-11-13 DIAGNOSIS — M546 Pain in thoracic spine: Secondary | ICD-10-CM | POA: Diagnosis not present

## 2017-11-13 DIAGNOSIS — M9901 Segmental and somatic dysfunction of cervical region: Secondary | ICD-10-CM | POA: Diagnosis not present

## 2017-11-18 DIAGNOSIS — M546 Pain in thoracic spine: Secondary | ICD-10-CM | POA: Diagnosis not present

## 2017-11-18 DIAGNOSIS — M9902 Segmental and somatic dysfunction of thoracic region: Secondary | ICD-10-CM | POA: Diagnosis not present

## 2017-11-18 DIAGNOSIS — M542 Cervicalgia: Secondary | ICD-10-CM | POA: Diagnosis not present

## 2017-11-18 DIAGNOSIS — M9901 Segmental and somatic dysfunction of cervical region: Secondary | ICD-10-CM | POA: Diagnosis not present

## 2017-11-20 DIAGNOSIS — M542 Cervicalgia: Secondary | ICD-10-CM | POA: Diagnosis not present

## 2017-11-20 DIAGNOSIS — M9901 Segmental and somatic dysfunction of cervical region: Secondary | ICD-10-CM | POA: Diagnosis not present

## 2017-11-20 DIAGNOSIS — M9902 Segmental and somatic dysfunction of thoracic region: Secondary | ICD-10-CM | POA: Diagnosis not present

## 2017-11-20 DIAGNOSIS — M546 Pain in thoracic spine: Secondary | ICD-10-CM | POA: Diagnosis not present

## 2017-11-29 DIAGNOSIS — M9902 Segmental and somatic dysfunction of thoracic region: Secondary | ICD-10-CM | POA: Diagnosis not present

## 2017-11-29 DIAGNOSIS — M542 Cervicalgia: Secondary | ICD-10-CM | POA: Diagnosis not present

## 2017-11-29 DIAGNOSIS — M546 Pain in thoracic spine: Secondary | ICD-10-CM | POA: Diagnosis not present

## 2017-11-29 DIAGNOSIS — M9901 Segmental and somatic dysfunction of cervical region: Secondary | ICD-10-CM | POA: Diagnosis not present

## 2017-12-01 ENCOUNTER — Other Ambulatory Visit: Payer: Self-pay | Admitting: Sports Medicine

## 2017-12-02 DIAGNOSIS — M9902 Segmental and somatic dysfunction of thoracic region: Secondary | ICD-10-CM | POA: Diagnosis not present

## 2017-12-02 DIAGNOSIS — M546 Pain in thoracic spine: Secondary | ICD-10-CM | POA: Diagnosis not present

## 2017-12-02 DIAGNOSIS — M542 Cervicalgia: Secondary | ICD-10-CM | POA: Diagnosis not present

## 2017-12-02 DIAGNOSIS — M9901 Segmental and somatic dysfunction of cervical region: Secondary | ICD-10-CM | POA: Diagnosis not present

## 2017-12-03 DIAGNOSIS — M546 Pain in thoracic spine: Secondary | ICD-10-CM | POA: Diagnosis not present

## 2017-12-03 DIAGNOSIS — M9902 Segmental and somatic dysfunction of thoracic region: Secondary | ICD-10-CM | POA: Diagnosis not present

## 2017-12-03 DIAGNOSIS — M542 Cervicalgia: Secondary | ICD-10-CM | POA: Diagnosis not present

## 2017-12-03 DIAGNOSIS — M9901 Segmental and somatic dysfunction of cervical region: Secondary | ICD-10-CM | POA: Diagnosis not present

## 2017-12-03 DIAGNOSIS — F332 Major depressive disorder, recurrent severe without psychotic features: Secondary | ICD-10-CM | POA: Diagnosis not present

## 2017-12-04 DIAGNOSIS — M9901 Segmental and somatic dysfunction of cervical region: Secondary | ICD-10-CM | POA: Diagnosis not present

## 2017-12-04 DIAGNOSIS — M546 Pain in thoracic spine: Secondary | ICD-10-CM | POA: Diagnosis not present

## 2017-12-04 DIAGNOSIS — M542 Cervicalgia: Secondary | ICD-10-CM | POA: Diagnosis not present

## 2017-12-04 DIAGNOSIS — M9902 Segmental and somatic dysfunction of thoracic region: Secondary | ICD-10-CM | POA: Diagnosis not present

## 2017-12-13 ENCOUNTER — Encounter: Payer: Self-pay | Admitting: Sports Medicine

## 2017-12-17 ENCOUNTER — Encounter: Payer: Self-pay | Admitting: Sports Medicine

## 2017-12-17 DIAGNOSIS — M542 Cervicalgia: Secondary | ICD-10-CM | POA: Diagnosis not present

## 2017-12-17 DIAGNOSIS — M546 Pain in thoracic spine: Secondary | ICD-10-CM | POA: Diagnosis not present

## 2017-12-17 DIAGNOSIS — M9902 Segmental and somatic dysfunction of thoracic region: Secondary | ICD-10-CM | POA: Diagnosis not present

## 2017-12-17 DIAGNOSIS — M9901 Segmental and somatic dysfunction of cervical region: Secondary | ICD-10-CM | POA: Diagnosis not present

## 2017-12-17 DIAGNOSIS — Z3009 Encounter for other general counseling and advice on contraception: Secondary | ICD-10-CM | POA: Diagnosis not present

## 2017-12-18 DIAGNOSIS — F332 Major depressive disorder, recurrent severe without psychotic features: Secondary | ICD-10-CM | POA: Diagnosis not present

## 2017-12-18 DIAGNOSIS — M9901 Segmental and somatic dysfunction of cervical region: Secondary | ICD-10-CM | POA: Diagnosis not present

## 2017-12-18 DIAGNOSIS — M542 Cervicalgia: Secondary | ICD-10-CM | POA: Diagnosis not present

## 2017-12-18 DIAGNOSIS — M9902 Segmental and somatic dysfunction of thoracic region: Secondary | ICD-10-CM | POA: Diagnosis not present

## 2017-12-18 DIAGNOSIS — M546 Pain in thoracic spine: Secondary | ICD-10-CM | POA: Diagnosis not present

## 2017-12-23 DIAGNOSIS — M654 Radial styloid tenosynovitis [de Quervain]: Secondary | ICD-10-CM | POA: Diagnosis not present

## 2017-12-23 DIAGNOSIS — M9901 Segmental and somatic dysfunction of cervical region: Secondary | ICD-10-CM | POA: Diagnosis not present

## 2017-12-23 DIAGNOSIS — M546 Pain in thoracic spine: Secondary | ICD-10-CM | POA: Diagnosis not present

## 2017-12-23 DIAGNOSIS — M9902 Segmental and somatic dysfunction of thoracic region: Secondary | ICD-10-CM | POA: Diagnosis not present

## 2017-12-23 DIAGNOSIS — M542 Cervicalgia: Secondary | ICD-10-CM | POA: Diagnosis not present

## 2017-12-23 DIAGNOSIS — R200 Anesthesia of skin: Secondary | ICD-10-CM | POA: Insufficient documentation

## 2017-12-27 DIAGNOSIS — M546 Pain in thoracic spine: Secondary | ICD-10-CM | POA: Diagnosis not present

## 2017-12-27 DIAGNOSIS — M9901 Segmental and somatic dysfunction of cervical region: Secondary | ICD-10-CM | POA: Diagnosis not present

## 2017-12-27 DIAGNOSIS — M542 Cervicalgia: Secondary | ICD-10-CM | POA: Diagnosis not present

## 2017-12-27 DIAGNOSIS — M9902 Segmental and somatic dysfunction of thoracic region: Secondary | ICD-10-CM | POA: Diagnosis not present

## 2017-12-29 DIAGNOSIS — M654 Radial styloid tenosynovitis [de Quervain]: Secondary | ICD-10-CM

## 2017-12-29 HISTORY — DX: Radial styloid tenosynovitis (de quervain): M65.4

## 2017-12-31 ENCOUNTER — Other Ambulatory Visit: Payer: Self-pay | Admitting: Orthopedic Surgery

## 2017-12-31 DIAGNOSIS — M546 Pain in thoracic spine: Secondary | ICD-10-CM | POA: Diagnosis not present

## 2017-12-31 DIAGNOSIS — M9902 Segmental and somatic dysfunction of thoracic region: Secondary | ICD-10-CM | POA: Diagnosis not present

## 2017-12-31 DIAGNOSIS — M9901 Segmental and somatic dysfunction of cervical region: Secondary | ICD-10-CM | POA: Diagnosis not present

## 2017-12-31 DIAGNOSIS — M542 Cervicalgia: Secondary | ICD-10-CM | POA: Diagnosis not present

## 2018-01-03 DIAGNOSIS — M9901 Segmental and somatic dysfunction of cervical region: Secondary | ICD-10-CM | POA: Diagnosis not present

## 2018-01-03 DIAGNOSIS — M542 Cervicalgia: Secondary | ICD-10-CM | POA: Diagnosis not present

## 2018-01-03 DIAGNOSIS — M9902 Segmental and somatic dysfunction of thoracic region: Secondary | ICD-10-CM | POA: Diagnosis not present

## 2018-01-03 DIAGNOSIS — M546 Pain in thoracic spine: Secondary | ICD-10-CM | POA: Diagnosis not present

## 2018-01-06 ENCOUNTER — Encounter (HOSPITAL_BASED_OUTPATIENT_CLINIC_OR_DEPARTMENT_OTHER): Payer: Self-pay | Admitting: *Deleted

## 2018-01-06 ENCOUNTER — Other Ambulatory Visit: Payer: Self-pay

## 2018-01-06 DIAGNOSIS — M546 Pain in thoracic spine: Secondary | ICD-10-CM | POA: Diagnosis not present

## 2018-01-06 DIAGNOSIS — M9902 Segmental and somatic dysfunction of thoracic region: Secondary | ICD-10-CM | POA: Diagnosis not present

## 2018-01-06 DIAGNOSIS — M9901 Segmental and somatic dysfunction of cervical region: Secondary | ICD-10-CM | POA: Diagnosis not present

## 2018-01-06 DIAGNOSIS — M542 Cervicalgia: Secondary | ICD-10-CM | POA: Diagnosis not present

## 2018-01-08 DIAGNOSIS — M546 Pain in thoracic spine: Secondary | ICD-10-CM | POA: Diagnosis not present

## 2018-01-08 DIAGNOSIS — M9901 Segmental and somatic dysfunction of cervical region: Secondary | ICD-10-CM | POA: Diagnosis not present

## 2018-01-08 DIAGNOSIS — M9902 Segmental and somatic dysfunction of thoracic region: Secondary | ICD-10-CM | POA: Diagnosis not present

## 2018-01-08 DIAGNOSIS — M542 Cervicalgia: Secondary | ICD-10-CM | POA: Diagnosis not present

## 2018-01-10 DIAGNOSIS — F332 Major depressive disorder, recurrent severe without psychotic features: Secondary | ICD-10-CM | POA: Diagnosis not present

## 2018-01-13 DIAGNOSIS — M9901 Segmental and somatic dysfunction of cervical region: Secondary | ICD-10-CM | POA: Diagnosis not present

## 2018-01-13 DIAGNOSIS — M9902 Segmental and somatic dysfunction of thoracic region: Secondary | ICD-10-CM | POA: Diagnosis not present

## 2018-01-13 DIAGNOSIS — M546 Pain in thoracic spine: Secondary | ICD-10-CM | POA: Diagnosis not present

## 2018-01-13 DIAGNOSIS — M542 Cervicalgia: Secondary | ICD-10-CM | POA: Diagnosis not present

## 2018-01-14 ENCOUNTER — Ambulatory Visit (HOSPITAL_BASED_OUTPATIENT_CLINIC_OR_DEPARTMENT_OTHER)
Admission: RE | Admit: 2018-01-14 | Discharge: 2018-01-14 | Disposition: A | Discharge status: Home / Self Care | Payer: BLUE CROSS/BLUE SHIELD | Source: Ambulatory Visit | Attending: Orthopedic Surgery | Admitting: Orthopedic Surgery

## 2018-01-14 ENCOUNTER — Encounter (HOSPITAL_BASED_OUTPATIENT_CLINIC_OR_DEPARTMENT_OTHER)
Admission: RE | Disposition: A | Discharge status: Home / Self Care | Payer: Self-pay | Source: Ambulatory Visit | Attending: Orthopedic Surgery

## 2018-01-14 ENCOUNTER — Ambulatory Visit (HOSPITAL_BASED_OUTPATIENT_CLINIC_OR_DEPARTMENT_OTHER): Payer: BLUE CROSS/BLUE SHIELD | Admitting: Anesthesiology

## 2018-01-14 ENCOUNTER — Other Ambulatory Visit: Payer: Self-pay

## 2018-01-14 ENCOUNTER — Encounter (HOSPITAL_BASED_OUTPATIENT_CLINIC_OR_DEPARTMENT_OTHER): Payer: Self-pay | Admitting: Anesthesiology

## 2018-01-14 DIAGNOSIS — Z970 Presence of artificial eye: Secondary | ICD-10-CM | POA: Diagnosis not present

## 2018-01-14 DIAGNOSIS — M25531 Pain in right wrist: Secondary | ICD-10-CM | POA: Diagnosis not present

## 2018-01-14 DIAGNOSIS — Z6830 Body mass index (BMI) 30.0-30.9, adult: Secondary | ICD-10-CM | POA: Diagnosis not present

## 2018-01-14 DIAGNOSIS — M654 Radial styloid tenosynovitis [de Quervain]: Secondary | ICD-10-CM | POA: Diagnosis not present

## 2018-01-14 DIAGNOSIS — E669 Obesity, unspecified: Secondary | ICD-10-CM | POA: Diagnosis not present

## 2018-01-14 HISTORY — DX: Dental restoration status: Z98.811

## 2018-01-14 HISTORY — PX: DORSAL COMPARTMENT RELEASE: SHX5039

## 2018-01-14 HISTORY — DX: Personal history of other diseases of the digestive system: Z87.19

## 2018-01-14 HISTORY — DX: Radial styloid tenosynovitis (de quervain): M65.4

## 2018-01-14 HISTORY — DX: Personal history of peptic ulcer disease: Z87.11

## 2018-01-14 HISTORY — DX: Attention-deficit hyperactivity disorder, unspecified type: F90.9

## 2018-01-14 SURGERY — RELEASE, FIRST DORSAL COMPARTMENT, HAND
Anesthesia: Monitor Anesthesia Care | Site: Wrist | Laterality: Right

## 2018-01-14 MED ORDER — PROPOFOL 500 MG/50ML IV EMUL
INTRAVENOUS | Status: DC | PRN
  Administered 2018-01-14: 75 ug/kg/min via INTRAVENOUS

## 2018-01-14 MED ORDER — HYDROCODONE-ACETAMINOPHEN 5-325 MG PO TABS: 1.0000 | ORAL_TABLET | Freq: Four times a day (QID) | ORAL | 0 refills | Status: DC | PRN

## 2018-01-14 MED ORDER — CEFAZOLIN SODIUM-DEXTROSE 2-4 GM/100ML-% IV SOLN
INTRAVENOUS | Status: AC
  Filled 2018-01-14: qty 100

## 2018-01-14 MED ORDER — FENTANYL CITRATE (PF) 100 MCG/2ML IJ SOLN: 25.0000 ug | INTRAMUSCULAR | Status: DC | PRN

## 2018-01-14 MED ORDER — ONDANSETRON HCL 4 MG/2ML IJ SOLN
INTRAMUSCULAR | Status: DC | PRN
  Administered 2018-01-14: 4 mg via INTRAVENOUS

## 2018-01-14 MED ORDER — ONDANSETRON HCL 4 MG/2ML IJ SOLN
INTRAMUSCULAR | Status: AC
  Filled 2018-01-14: qty 2

## 2018-01-14 MED ORDER — ONDANSETRON HCL 4 MG/2ML IJ SOLN: 4.0000 mg | Freq: Once | INTRAMUSCULAR | Status: DC | PRN

## 2018-01-14 MED ORDER — BUPIVACAINE HCL (PF) 0.25 % IJ SOLN
INTRAMUSCULAR | Status: DC | PRN
  Administered 2018-01-14: 6 mL

## 2018-01-14 MED ORDER — MIDAZOLAM HCL 2 MG/2ML IJ SOLN
INTRAMUSCULAR | Status: AC
  Filled 2018-01-14: qty 2

## 2018-01-14 MED ORDER — FENTANYL CITRATE (PF) 100 MCG/2ML IJ SOLN
INTRAMUSCULAR | Status: AC
  Filled 2018-01-14: qty 2

## 2018-01-14 MED ORDER — LIDOCAINE HCL (PF) 0.5 % IJ SOLN
INTRAMUSCULAR | Status: DC | PRN
  Administered 2018-01-14: 50 mL via INTRAVENOUS

## 2018-01-14 MED ORDER — LIDOCAINE HCL (CARDIAC) PF 100 MG/5ML IV SOSY
PREFILLED_SYRINGE | INTRAVENOUS | Status: DC | PRN
  Administered 2018-01-14: 50 mg via INTRAVENOUS

## 2018-01-14 MED ORDER — CHLORHEXIDINE GLUCONATE 4 % EX LIQD: 60.0000 mL | Freq: Once | CUTANEOUS | Status: DC

## 2018-01-14 MED ORDER — HYDROCODONE-ACETAMINOPHEN 7.5-325 MG PO TABS: 1.0000 | ORAL_TABLET | Freq: Once | ORAL | Status: DC | PRN

## 2018-01-14 MED ORDER — MEPERIDINE HCL 25 MG/ML IJ SOLN: 6.2500 mg | INTRAMUSCULAR | Status: DC | PRN

## 2018-01-14 MED ORDER — BUPIVACAINE HCL (PF) 0.25 % IJ SOLN
INTRAMUSCULAR | Status: AC
  Filled 2018-01-14: qty 60

## 2018-01-14 MED ORDER — LACTATED RINGERS IV SOLN: INTRAVENOUS | Status: DC

## 2018-01-14 MED ORDER — MIDAZOLAM HCL 2 MG/2ML IJ SOLN
1.0000 mg | INTRAMUSCULAR | Status: DC | PRN
  Administered 2018-01-14 (×2): 1 mg via INTRAVENOUS

## 2018-01-14 MED ORDER — SCOPOLAMINE 1 MG/3DAYS TD PT72: 1.0000 | MEDICATED_PATCH | Freq: Once | TRANSDERMAL | Status: DC | PRN

## 2018-01-14 MED ORDER — CEFAZOLIN SODIUM-DEXTROSE 2-4 GM/100ML-% IV SOLN
2.0000 g | INTRAVENOUS | Status: AC
  Administered 2018-01-14: 2 g via INTRAVENOUS

## 2018-01-14 MED ORDER — FENTANYL CITRATE (PF) 100 MCG/2ML IJ SOLN
50.0000 ug | INTRAMUSCULAR | Status: DC | PRN
  Administered 2018-01-14 (×2): 50 ug via INTRAVENOUS

## 2018-01-14 SURGICAL SUPPLY — 41 items
BENZOIN TINCTURE PRP APPL 2/3 (GAUZE/BANDAGES/DRESSINGS) ×2 IMPLANT
BLADE SURG 15 STRL LF DISP TIS (BLADE) ×1 IMPLANT
BLADE SURG 15 STRL SS (BLADE) ×1
BNDG COHESIVE 3X5 TAN STRL LF (GAUZE/BANDAGES/DRESSINGS) ×2 IMPLANT
BNDG ESMARK 4X9 LF (GAUZE/BANDAGES/DRESSINGS) IMPLANT
BNDG GAUZE ELAST 4 BULKY (GAUZE/BANDAGES/DRESSINGS) ×2 IMPLANT
CHLORAPREP W/TINT 26ML (MISCELLANEOUS) ×2 IMPLANT
CORD BIPOLAR FORCEPS 12FT (ELECTRODE) ×2 IMPLANT
COVER BACK TABLE 60X90IN (DRAPES) ×2 IMPLANT
COVER MAYO STAND STRL (DRAPES) ×2 IMPLANT
CUFF TOURNIQUET SINGLE 18IN (TOURNIQUET CUFF) ×2 IMPLANT
DECANTER SPIKE VIAL GLASS SM (MISCELLANEOUS) IMPLANT
DRAPE EXTREMITY T 121X128X90 (DRAPE) ×2 IMPLANT
DRAPE SURG 17X23 STRL (DRAPES) ×2 IMPLANT
GAUZE SPONGE 4X4 12PLY STRL (GAUZE/BANDAGES/DRESSINGS) ×2 IMPLANT
GAUZE XEROFORM 1X8 LF (GAUZE/BANDAGES/DRESSINGS) ×2 IMPLANT
GLOVE BIOGEL PI IND STRL 8.5 (GLOVE) ×1 IMPLANT
GLOVE BIOGEL PI INDICATOR 8.5 (GLOVE) ×1
GLOVE SURG ORTHO 8.0 STRL STRW (GLOVE) ×2 IMPLANT
GOWN STRL REUS W/ TWL LRG LVL3 (GOWN DISPOSABLE) ×1 IMPLANT
GOWN STRL REUS W/TWL LRG LVL3 (GOWN DISPOSABLE) ×1
GOWN STRL REUS W/TWL XL LVL3 (GOWN DISPOSABLE) ×2 IMPLANT
NEEDLE PRECISIONGLIDE 27X1.5 (NEEDLE) ×2 IMPLANT
NS IRRIG 1000ML POUR BTL (IV SOLUTION) ×2 IMPLANT
PACK BASIN DAY SURGERY FS (CUSTOM PROCEDURE TRAY) ×2 IMPLANT
PAD CAST 3X4 CTTN HI CHSV (CAST SUPPLIES) ×1 IMPLANT
PADDING CAST ABS 4INX4YD NS (CAST SUPPLIES) ×1
PADDING CAST ABS COTTON 4X4 ST (CAST SUPPLIES) ×1 IMPLANT
PADDING CAST COTTON 3X4 STRL (CAST SUPPLIES) ×1
SPLINT PLASTER CAST XFAST 3X15 (CAST SUPPLIES) IMPLANT
SPLINT PLASTER XTRA FASTSET 3X (CAST SUPPLIES)
STOCKINETTE 4X48 STRL (DRAPES) ×2 IMPLANT
STRIP CLOSURE SKIN 1/2X4 (GAUZE/BANDAGES/DRESSINGS) ×2 IMPLANT
SUT ETHILON 4 0 PS 2 18 (SUTURE) ×2 IMPLANT
SUT MNCRL AB 4-0 PS2 18 (SUTURE) ×2 IMPLANT
SUT VIC AB 4-0 P2 18 (SUTURE) IMPLANT
SUT VICRYL 4-0 PS2 18IN ABS (SUTURE) IMPLANT
SYR BULB 3OZ (MISCELLANEOUS) ×2 IMPLANT
SYR CONTROL 10ML LL (SYRINGE) ×2 IMPLANT
TOWEL GREEN STERILE FF (TOWEL DISPOSABLE) ×4 IMPLANT
UNDERPAD 30X30 (UNDERPADS AND DIAPERS) ×2 IMPLANT

## 2018-01-14 NOTE — Op Note (Signed)
NAME: Mark Fritz MEDICAL RECORD NO: [id number] DATE OF BIRTH: 1985/08/02 [facility] PHYSICIAN: Nicki ReaperGARY R. Mayanna Garlitz, MD   OPERATIVE REPORT   DATE OF PROCEDURE: 01/14/18    PREOPERATIVE DIAGNOSIS:   Tommi Rumpse Quervain's tendinitis right wrist   POSTOPERATIVE DIAGNOSIS:   Same   PROCEDURE:   Release first dorsal extensor compartment right wrist   SURGEON: Cindee SaltGary Manly Nestle, M.D.   ASSISTANT: none   ANESTHESIA:  Bier block with sedation and Local   INTRAVENOUS FLUIDS:  Per anesthesia flow sheet.   ESTIMATED BLOOD LOSS:  Minimal.   COMPLICATIONS:  None.   SPECIMENS:  none   TOURNIQUET TIME:    Total Tourniquet Time Documented: Upper Arm (Right) - 27 minutes Total: Upper Arm (Right) - 27 minutes    DISPOSITION:  Stable to PACU.   INDICATIONS: Patient is a 32 year old male with a history of radial sided wrist pain right wrist.  This is not responded to conservative treatment is indicative of de Quervain's tendinitis.  He has elected undergo surgical release of the first dorsal extensor compartment.  Pre-peri-and postoperative course been discussed along with risks and complications.  He is aware that there is no guarantee to the surgery the possibility of infection recurrence injury to arteries nerves tendons incomplete relief of symptoms and dystrophy.  Possibility of radial nerve irritation has been discussed with him.  In the preoperative area the patient is seen extremity marked by both patient and surgeon antibiotic given  OPERATIVE COURSE: The patient is brought to the operating room where a upper arm IV regional anesthetic was carried out without difficulty under the direction the anesthesia department after being placed in the supine position in the operating table.  He was prepped using ChloraPrep in the supine position with the right arm free.  A three-minute dry time was allowed timeout taken to confirm patient procedure.  An oblique incision  was made over the radial aspect of the wrist in line with the first dorsal extensor compartment tendons distally.  This carried down through subcutaneous tissue.  Bleeders were electrocauterized with bipolar.  The vein present along with radial nerve branches were protected with retractors and gently pulled from the first dorsal extensor compartment.  This was then released on its most superior aspect.  A large EPB tendon was immediately encountered.  The abductor pollicis longus was in a separate compartment.  The septum was incised this allowed the abductor to be released.  Without significant tenosynovitis was present and tenosynovectomy performed proximally.  Moderate amount of fluid was present.  The septum was then removed.  The wound was copiously irrigated with saline.  The aponeurosis was then placed over the 2 tendons.  The skin was then cut closed with a subcuticular 4-0 Monocryl suture after irrigation.  Steri-Strips were applied along with bend benzoin.  A sterile compressive dressing thumb spica splint was applied.  Deflation of the tourniquet all fingers immediately pink.  He was taken to the recovery room for observation in satisfactory condition.  Prior to application of the dressing and splint a local infiltration with quarter percent bupivacaine without epinephrine was given approximately 8 cc was used.  The patient tolerated the procedure well.  He will be [facility] Norco for breakthrough advised to take Tylenol ibuprofen for pain.   Nicki ReaperKUZMA,Koron Godeaux R, MD Electronically signed, 01/14/18

## 2018-01-14 NOTE — Anesthesia Preprocedure Evaluation (Addendum)
Anesthesia Evaluation  Patient identified by MRN, date of birth, ID band Patient awake    Reviewed: Allergy & Precautions, NPO status , Patient's Chart, lab work & pertinent test results  Airway Mallampati: II  TM Distance: >3 FB Neck ROM: Full    Dental no notable dental hx. (+) Teeth Intact   Pulmonary neg pulmonary ROS,    Pulmonary exam normal breath sounds clear to auscultation       Cardiovascular negative cardio ROS Normal cardiovascular exam Rhythm:Regular Rate:Normal     Neuro/Psych PSYCHIATRIC DISORDERS ADHDnegative neurological ROS     GI/Hepatic Neg liver ROS, PUD,   Endo/Other  Obesity  Renal/GU negative Renal ROS  negative genitourinary   Musculoskeletal DeQuervain's contracture right wrist   Abdominal (+) + obese,   Peds  Hematology negative hematology ROS (+)   Anesthesia Other Findings   Reproductive/Obstetrics                            Anesthesia Physical Anesthesia Plan  ASA: II  Anesthesia Plan: Bier Block and MAC and Bier Block-LIDOCAINE ONLY   Post-op Pain Management:    Induction: Intravenous  PONV Risk Score and Plan: 3 and Midazolam, Ondansetron and Treatment may vary due to age or medical condition  Airway Management Planned: Natural Airway and Simple Face Mask  Additional Equipment:   Intra-op Plan:   Post-operative Plan:   Informed Consent: I have reviewed the patients History and Physical, chart, labs and discussed the procedure including the risks, benefits and alternatives for the proposed anesthesia with the patient or authorized representative who has indicated his/her understanding and acceptance.     Plan Discussed with: CRNA and Surgeon  Anesthesia Plan Comments:         Anesthesia Quick Evaluation

## 2018-01-14 NOTE — Brief Op Note (Signed)
01/14/2018  12:10 PM  PATIENT:  Mark Fritz  32 y.o. male  PRE-OPERATIVE DIAGNOSIS:  DEQUERVAINS RIGHT WRIST  POST-OPERATIVE DIAGNOSIS:  DEQUERVAINS RIGHT WRIST  PROCEDURE:  Procedure(s): RIGHT WRIST RELEASE DORSAL COMPARTMENT (DEQUERVAIN) (Right)  SURGEON:  Surgeon(s) and Role:    * Cindee SaltKuzma, Alilah Mcmeans, MD - Primary  PHYSICIAN ASSISTANT:   ASSISTANTS: none   ANESTHESIA:   local, regional and IV sedation  EBL:  1ml BLOOD ADMINISTERED:none  DRAINS: none   LOCAL MEDICATIONS USED:  BUPIVICAINE   SPECIMEN:  No Specimen  DISPOSITION OF SPECIMEN:  N/A  COUNTS:  YES  TOURNIQUET:   Total Tourniquet Time Documented: Upper Arm (Right) - 27 minutes Total: Upper Arm (Right) - 27 minutes   DICTATION: .Reubin Milanragon Dictation  PLAN OF CARE: Discharge to home after PACU  PATIENT DISPOSITION:  PACU - hemodynamically stable.

## 2018-01-14 NOTE — Discharge Instructions (Addendum)

## 2018-01-14 NOTE — Transfer of Care (Signed)
Immediate Anesthesia Transfer of Care Note  Patient: Mark CharlestonBryce R Fritz  Procedure(s) Performed: RIGHT WRIST RELEASE DORSAL COMPARTMENT (DEQUERVAIN) (Right Wrist)  Patient Location: PACU  Anesthesia Type:Bier block  Level of Consciousness: awake, alert  and oriented  Airway & Oxygen Therapy: Patient Spontanous Breathing and Patient connected to face mask oxygen  Post-op Assessment: Report given to RN and Post -op Vital signs reviewed and stable  Post vital signs: Reviewed and stable  Last Vitals:  Vitals Value Taken Time  BP    Temp    Pulse 86 01/14/2018 12:08 PM  Resp 9 01/14/2018 12:08 PM  SpO2 97 % 01/14/2018 12:08 PM  Vitals shown include unvalidated device data.  Last Pain:  Vitals:   01/14/18 1048  TempSrc: Oral  PainSc: 3          Complications: No apparent anesthesia complications

## 2018-01-14 NOTE — H&P (Signed)
Mark Fritz is an 32 y.o. male.   Chief Complaint: right wrist pain HPI:  Samuella Cotarice is a 32 year old right-hand-dominant male comes in at the request of Gaspar BiddingMichael Rigby for consultation regarding pain in his right wrist de Quervain's tendinitis which is been going on for 1 year. He is used to braces one including his wrist without his thumb the second including his thumb without his wrist. He has had 3 injections which have helped. He has had an MRI and ultrasound. The ultrasounds reveal a reveals a resolving de Quervain's tendinitis the MRI reveals some degeneration of the TFCC otherwise negative. He has a VAS score of 8/10. He is complaining of some numbness in the radial nerve distribution which has occurred more recently. Does have a pain in his neck and sees a Landchiropractor. He has no history of diabetes thyroid problems arthritis gout. Family history is positive arthritis negative for diabetes thyroid problems and gout. He states that it is getting intermittently better and worse. Sections have helped. One in the injections was into the joint which was not as effective.  Past Medical History:  Diagnosis Date  . ADHD (attention deficit hyperactivity disorder)   . De Quervain's tenosynovitis, right 12/2017  . Dental crowns present    and 1 implant  . History of gastric ulcer   . 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 BLEPHAROPLASTY Right   . BUNIONECTOMY Left 2008    Family History  Problem Relation Age of Onset  . Depression Mother   . Diabetes Mother   . Miscarriages / [place] Mother   . Hearing loss Father   . Hypertension Father   . Mental illness Maternal Grandmother   . Hypertension Paternal Grandfather   . Stroke Paternal Grandfather    Social History:  reports that he has never smoked. He has never used smokeless tobacco. He reports that he does not drink alcohol or use drugs.  Allergies: No Known  Allergies  No medications prior to admission.    No results found for this or any previous visit (from the past 48 hour(s)).  No results found.   Pertinent items are noted in HPI.  Height 5\' 7"  (1.702 m), weight 86.2 kg.  General appearance: alert, cooperative and appears stated age Head: Normocephalic, without obvious abnormality Neck: no JVD Resp: clear to auscultation bilaterally Cardio: regular rate and rhythm, S1, S2 normal, no murmur, click, rub or gallop GI: soft, non-tender; bowel sounds normal; no masses,  no organomegaly Extremities: right wrist pain Pulses: 2+ and symmetric Skin: no lesions Neurologic: Grossly normal Incision/Wound: na  Assessment/Plan Assessment:  1. Numbness   2. De Quervain's syndrome (tenosynovitis)    Plan: Advised that there is a potential stone that this can be coming from his neck. He does appear to have a de Quervain's tendinitis. We have discussed possibility of surgical release of the first dorsal extensor compartment. Pre-peri-and postoperative course are discussed along with risk applications. He is aware that there is no guarantee to the surgery the possibility of infection recurrence injury to arteries nerves tendons and exacerbation of any radial nerve symptoms with the surgery if there is a component coming from his neck. Feels that he would like to proceed but would like to obtain a second opinion. We have encouraged him to do so. Information is taking so that he can call back to schedule if he prefers. Advised this can  be done as an outpatient. His fingers will be free. He cannot get it wet for 2 weeks.   Vasiliy Mccarry R 01/14/2018, 8:18 AM

## 2018-01-14 NOTE — Anesthesia Postprocedure Evaluation (Signed)
Anesthesia Post Note  Patient: Mark Fritz  Procedure(s) Performed: RIGHT WRIST RELEASE DORSAL COMPARTMENT (DEQUERVAIN) (Right Wrist)     Patient location during evaluation: PACU Anesthesia Type: MAC and Regional Level of consciousness: awake and alert and oriented Pain management: pain level controlled Vital Signs Assessment: post-procedure vital signs reviewed and stable Respiratory status: spontaneous breathing, nonlabored ventilation and respiratory function stable Cardiovascular status: blood pressure returned to baseline and stable Postop Assessment: no apparent nausea or vomiting Anesthetic complications: no    Last Vitals:  Vitals:   01/14/18 1230 01/14/18 1245  BP: 124/83 126/84  Pulse: 70 68  Resp: 14 16  Temp:  36.7 C  SpO2: 96% 98%    Last Pain:  Vitals:   01/14/18 1245  TempSrc:   PainSc: 0-No pain                 Evana Runnels A.

## 2018-01-15 ENCOUNTER — Encounter (HOSPITAL_BASED_OUTPATIENT_CLINIC_OR_DEPARTMENT_OTHER): Payer: Self-pay | Admitting: Orthopedic Surgery

## 2018-01-31 ENCOUNTER — Telehealth: Payer: Self-pay | Admitting: Psychiatry

## 2018-01-31 ENCOUNTER — Other Ambulatory Visit: Payer: Self-pay

## 2018-01-31 MED ORDER — SERTRALINE HCL 50 MG PO TABS: ORAL_TABLET | ORAL | 1 refills | Status: DC

## 2018-01-31 NOTE — Telephone Encounter (Signed)
I checked the registry he can have the eveko. Its in system right. I already did the zoloft. Jessica's patient.  Thank you

## 2018-01-31 NOTE — Telephone Encounter (Signed)
HE REPORTS THAT HE LOST HIS WRITTEN RX FOR EVEKEO AND ZOLOFT.  PLEASE CALL TO [facility]  [phone] HAS APPT 02/28/18

## 2018-02-03 MED ORDER — AMPHETAMINE SULFATE 10 MG PO TABS: 10.0000 mg | ORAL_TABLET | Freq: Two times a day (BID) | ORAL | 0 refills | Status: DC

## 2018-02-03 NOTE — Telephone Encounter (Signed)
Pt reports that he lost scripts for Evekeo and Zoloft. Controlled susbstance database refilled. No recent Evekeo fills (last fill 12/02/17). Will send in [facility] script.

## 2018-02-03 NOTE — Telephone Encounter (Signed)
Called pt about refills and he's aware to pick up.

## 2018-02-19 ENCOUNTER — Encounter: Payer: Self-pay | Admitting: Emergency Medicine

## 2018-02-19 DIAGNOSIS — F331 Major depressive disorder, recurrent, moderate: Secondary | ICD-10-CM | POA: Insufficient documentation

## 2018-02-25 ENCOUNTER — Other Ambulatory Visit: Payer: Self-pay

## 2018-02-25 MED ORDER — SERTRALINE HCL 50 MG PO TABS: ORAL_TABLET | ORAL | 0 refills | Status: DC

## 2018-02-28 ENCOUNTER — Ambulatory Visit: Payer: Self-pay | Admitting: Psychiatry

## 2018-05-01 ENCOUNTER — Telehealth: Payer: Self-pay | Admitting: Psychiatry

## 2018-05-01 ENCOUNTER — Other Ambulatory Visit: Payer: Self-pay | Admitting: Psychiatry

## 2018-05-01 DIAGNOSIS — F902 Attention-deficit hyperactivity disorder, combined type: Secondary | ICD-10-CM

## 2018-05-01 MED ORDER — AMPHETAMINE SULFATE 10 MG PO TABS: 10.0000 mg | ORAL_TABLET | Freq: Two times a day (BID) | ORAL | 0 refills | Status: DC

## 2018-05-01 NOTE — Telephone Encounter (Signed)
Pt. Called and said that he needs a refill on his eveko 10 mg 1/2 tab 2 x daily. His next appt is 05/19/2018. [facility] [address]

## 2018-05-01 NOTE — Telephone Encounter (Signed)
Script sent  

## 2018-05-01 NOTE — Telephone Encounter (Signed)
Pharmacy called about the [facility] prescription. It has 2 different directions and # of pills being doesn't match.  They need clarification on directions.  They are going to delete the one sent in.  Please send a new RX with clear directions.

## 2018-05-01 NOTE — Telephone Encounter (Signed)
Please escribe new rx   Thanks

## 2018-05-06 MED ORDER — AMPHETAMINE SULFATE 10 MG PO TABS: 10.0000 mg | ORAL_TABLET | Freq: Two times a day (BID) | ORAL | 0 refills | Status: DC

## 2018-05-06 NOTE — Telephone Encounter (Signed)
Pharmacy reported [facility] script not received. Script sent again

## 2018-05-19 ENCOUNTER — Ambulatory Visit (INDEPENDENT_AMBULATORY_CARE_PROVIDER_SITE_OTHER): Payer: BLUE CROSS/BLUE SHIELD | Admitting: Psychiatry

## 2018-05-19 ENCOUNTER — Encounter: Payer: Self-pay | Admitting: Psychiatry

## 2018-05-19 VITALS — BP 106/64 | HR 76

## 2018-05-19 DIAGNOSIS — F900 Attention-deficit hyperactivity disorder, predominantly inattentive type: Secondary | ICD-10-CM | POA: Diagnosis not present

## 2018-05-19 DIAGNOSIS — F3342 Major depressive disorder, recurrent, in full remission: Secondary | ICD-10-CM | POA: Diagnosis not present

## 2018-05-19 DIAGNOSIS — F902 Attention-deficit hyperactivity disorder, combined type: Secondary | ICD-10-CM | POA: Diagnosis not present

## 2018-05-19 MED ORDER — AMPHETAMINE SULFATE 10 MG PO TABS: 10.0000 mg | ORAL_TABLET | Freq: Two times a day (BID) | ORAL | 0 refills | Status: DC

## 2018-05-19 NOTE — Progress Notes (Signed)
Mark Fritz [id number] 16-May-1985 33 y.o.  Subjective:   Patient ID:  Mark Fritz is a 33 y.o. (DOB [birth year]) male.  Chief Complaint:  Chief Complaint  Patient presents with  . ADD  . Follow-up    h/o Depression    HPI Mark Fritz presents to the office today for follow-up of ADHD, depression, and anxiety. Reports that he did not take Evekeo over the holidays and noticed that he was not as patient, ie. Noticed he was less patient when others were   He reports that he decided to decrease caffeine in the [place] and has decided to eliminate it altogether. Reports that without caffeine he occasionally needs Evekeo 15 mg daily and most days 10 mg of Evekeo is adequate. He reports that anxiety has improved with eliminating caffeine. Reports that anxiety is well controlled and is only episodic, mild, and in response to significant stressor. Reports that he no longer has immobilizing anxiety.   He reports that he stopped taking Sertraline after about a week. Reports from late November to early January he had a "depressive periods." Reports that his mood improved. Reports minimal irritability. Denies any depression over the last few weeks. He reports that this occurs about 1-2 times a year. He reports that sleep has improved since eliminating caffeine. Reports that he typically sleeps 6-7 hours a night. He reports that he typically awakens feeling rested and before his alarm. Appetite has been stable and denies appetite suppression with Evekeo. He reports energy and motivation has been good. He reports concentration has been adequate and able to complete tasks that he would normally find tedious and disinteresting. Reports that he tends to have more difficulty with inattention compared to hyper-activity. Denies SI.   Past Psychiatric Medication Trials: Sertraline-adverse effects Adderall- decreased appetite, increased irritability Evekeo- effective for attention deficit with out  significant tolerability issues  Review of Systems:  Review of Systems  Cardiovascular: Negative for palpitations.  Musculoskeletal: Negative for gait problem.       Had wrist surgery in October and reports that this has helped increase function and had a positive impact on his mood.   Neurological: Negative for tremors.  Psychiatric/Behavioral:       Please refer to HPI    Medications: I have reviewed the patient's current medications.  Current Outpatient Medications  Medication Sig Dispense Refill  . [START ON 06/03/2018] Amphetamine Sulfate (EVEKEO) 10 MG TABS Take 10 mg by mouth 2 (two) times daily for 30 days. Take 1/2-1 tab po BID 45 tablet 0  . doxylamine, Sleep, (UNISOM) 25 MG tablet Take 25 mg by mouth at bedtime as needed.    . Famotidine (PEPCID PO) Take by mouth daily as needed.    . Melatonin 3 MG TABS Take by mouth at bedtime as needed.    Melene Muller ON 07/01/2018] Amphetamine Sulfate (EVEKEO) 10 MG TABS Take 10 mg by mouth 2 (two) times daily for 30 days. Take 1/2-1 tab po BID 45 tablet 0  . [START ON 07/29/2018] Amphetamine Sulfate (EVEKEO) 10 MG TABS Take 10 mg by mouth 2 (two) times daily for 30 days. Take 1/2-1 tab po BID 45 tablet 0   No current facility-administered medications for this visit.     Medication Side Effects: None  Allergies: No Known Allergies  Past Medical History:  Diagnosis Date  . ADHD (attention deficit hyperactivity disorder)   . De Quervain's tenosynovitis, right 12/2017  . Dental crowns present    and  1 implant  . History of gastric ulcer   . 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 Depression Mother   . Diabetes Mother   . Miscarriages / [place]   . Hearing loss Father   . Hypertension Father   . Mental illness Maternal Grandmother   . Hypertension Paternal Grandfather   . Stroke Paternal Grandfather   . Depression Sister     Social History   Socioeconomic  History  . Marital status: Married    Spouse name: Not on file  . Number of children: Not on file  . Years of education: Not on file  . Highest education level: Not on file  Occupational History  . Not on file  Social Needs  . Financial resource strain: Not on file  . Food insecurity:    Worry: Not on file    Inability: Not on file  . Transportation needs:    Medical: Not on file    Non-medical: Not on file  Tobacco Use  . Smoking status: Never Smoker  . Smokeless tobacco: Never Used  Substance and Sexual Activity  . Alcohol use: No  . Drug use: No  . Sexual activity: Yes  Lifestyle  . Physical activity:    Days per week: Not on file    Minutes per session: Not on file  . Stress: Not on file  Relationships  . Social connections:    Talks on phone: Not on file    Gets together: Not on file    Attends religious service: Not on file    Active member of club or organization: Not on file    Attends meetings of clubs or organizations: Not on file    Relationship status: Not on file  . Intimate partner violence:    Fear of current or ex partner: Not on file    Emotionally abused: Not on file    Physically abused: Not on file    Forced sexual activity: Not on file  Other Topics Concern  . Not on file  Social History Narrative  . Not on file    Past Medical History, Surgical history, Social history, and Family history were reviewed and updated as appropriate.   Please see review of systems for further details on the patient's review from today.   Objective:   Physical Exam:  BP 106/64   Pulse 76   Physical Exam Constitutional:      General: He is not in acute distress.    Appearance: He is well-developed.  Musculoskeletal:        General: No deformity.  Neurological:     Mental Status: He is alert and oriented to person, place, and time.     Coordination: Coordination normal.  Psychiatric:        Mood and Affect: Mood is not anxious or depressed. Affect is  not labile, blunt, angry or inappropriate.        Speech: Speech normal.        Behavior: Behavior normal.        Thought Content: Thought content normal. Thought content does not include homicidal or suicidal ideation. Thought content does not include homicidal or suicidal plan.        Judgment: Judgment normal.     Comments: Insight intact. No auditory or visual hallucinations. No delusions.      Lab Review:     Component Value Date/Time  NA 138 07/10/2017 0859   K 4.2 07/10/2017 0859   CL 101 07/10/2017 0859   CO2 30 07/10/2017 0859   GLUCOSE 107 (H) 07/10/2017 0859   BUN 18 07/10/2017 0859   CREATININE 1.04 07/10/2017 0859   CALCIUM 10.0 07/10/2017 0859   PROT 7.5 07/10/2017 0859   ALBUMIN 4.8 07/10/2017 0859   AST 21 07/10/2017 0859   ALT 30 07/10/2017 0859   ALKPHOS 49 07/10/2017 0859   BILITOT 0.5 07/10/2017 0859       Component Value Date/Time   WBC 5.4 07/10/2017 0859   RBC 5.39 07/10/2017 0859   HGB 15.6 07/10/2017 0859   HCT 45.0 07/10/2017 0859   PLT 250.0 07/10/2017 0859   MCV 83.5 07/10/2017 0859   MCHC 34.7 07/10/2017 0859   RDW 13.0 07/10/2017 0859   LYMPHSABS 2.1 07/10/2017 0859   MONOABS 0.4 07/10/2017 0859   EOSABS 0.1 07/10/2017 0859   BASOSABS 0.0 07/10/2017 0859    No results found for: POCLITH, LITHIUM   No results found for: PHENYTOIN, PHENOBARB, VALPROATE, CBMZ   .res Assessment: Plan:   Will continue Evekeo 10 mg 1/2-1 tab p.o. twice daily for attention deficit since patient reports that of Evekeo has been helpful for concentration and distractibility. Discussed possible alternatives to medications to help with episodes of mild depression to include recommending seeing PCP for yearly wellness exam and having labs drawn to rule out any possible medical contributing factors, such as low vitamin D, thyroid disorder, or anemia.  Discussed that vitamin D supplementation can be helpful for depression, particularly for seasonal affective  depression and when vitamin D levels are low or on the lower end of normal range.  Briefly discussed potential benefits and risk of [place] and encouraged patient to call and request samples if he were to experience some recurrence of depression in the future. Attention deficit hyperactivity disorder (ADHD), predominantly inattentive type  Recurrent major depressive disorder, in full remission (HCC)  Attention deficit hyperactivity disorder (ADHD), combined type - Plan: Amphetamine Sulfate (EVEKEO) 10 MG TABS, Amphetamine Sulfate (EVEKEO) 10 MG TABS, Amphetamine Sulfate (EVEKEO) 10 MG TABS  Please see After Visit Summary for patient specific instructions.  Future Appointments  Date Time Provider Department Center  09/17/2018  8:00 AM Corie Chiquitoarter, Sopheap Boehle, PMHNP [facility] None    No orders of the defined types were placed in this encounter.     -------------------------------

## 2018-06-06 DIAGNOSIS — Z302 Encounter for sterilization: Secondary | ICD-10-CM | POA: Diagnosis not present

## 2018-06-30 DIAGNOSIS — M9902 Segmental and somatic dysfunction of thoracic region: Secondary | ICD-10-CM | POA: Diagnosis not present

## 2018-06-30 DIAGNOSIS — M542 Cervicalgia: Secondary | ICD-10-CM | POA: Diagnosis not present

## 2018-06-30 DIAGNOSIS — M9901 Segmental and somatic dysfunction of cervical region: Secondary | ICD-10-CM | POA: Diagnosis not present

## 2018-06-30 DIAGNOSIS — M546 Pain in thoracic spine: Secondary | ICD-10-CM | POA: Diagnosis not present

## 2018-07-02 DIAGNOSIS — M546 Pain in thoracic spine: Secondary | ICD-10-CM | POA: Diagnosis not present

## 2018-07-02 DIAGNOSIS — M9901 Segmental and somatic dysfunction of cervical region: Secondary | ICD-10-CM | POA: Diagnosis not present

## 2018-07-02 DIAGNOSIS — M542 Cervicalgia: Secondary | ICD-10-CM | POA: Diagnosis not present

## 2018-07-02 DIAGNOSIS — M9902 Segmental and somatic dysfunction of thoracic region: Secondary | ICD-10-CM | POA: Diagnosis not present

## 2018-07-03 DIAGNOSIS — M9902 Segmental and somatic dysfunction of thoracic region: Secondary | ICD-10-CM | POA: Diagnosis not present

## 2018-07-03 DIAGNOSIS — M546 Pain in thoracic spine: Secondary | ICD-10-CM | POA: Diagnosis not present

## 2018-07-03 DIAGNOSIS — M9901 Segmental and somatic dysfunction of cervical region: Secondary | ICD-10-CM | POA: Diagnosis not present

## 2018-07-03 DIAGNOSIS — M542 Cervicalgia: Secondary | ICD-10-CM | POA: Diagnosis not present

## 2018-07-07 DIAGNOSIS — M9901 Segmental and somatic dysfunction of cervical region: Secondary | ICD-10-CM | POA: Diagnosis not present

## 2018-07-07 DIAGNOSIS — M9902 Segmental and somatic dysfunction of thoracic region: Secondary | ICD-10-CM | POA: Diagnosis not present

## 2018-07-07 DIAGNOSIS — M542 Cervicalgia: Secondary | ICD-10-CM | POA: Diagnosis not present

## 2018-07-07 DIAGNOSIS — M546 Pain in thoracic spine: Secondary | ICD-10-CM | POA: Diagnosis not present

## 2018-07-08 DIAGNOSIS — M546 Pain in thoracic spine: Secondary | ICD-10-CM | POA: Diagnosis not present

## 2018-07-08 DIAGNOSIS — M9902 Segmental and somatic dysfunction of thoracic region: Secondary | ICD-10-CM | POA: Diagnosis not present

## 2018-07-08 DIAGNOSIS — M9901 Segmental and somatic dysfunction of cervical region: Secondary | ICD-10-CM | POA: Diagnosis not present

## 2018-07-08 DIAGNOSIS — M542 Cervicalgia: Secondary | ICD-10-CM | POA: Diagnosis not present

## 2018-07-09 DIAGNOSIS — M542 Cervicalgia: Secondary | ICD-10-CM | POA: Diagnosis not present

## 2018-07-09 DIAGNOSIS — M546 Pain in thoracic spine: Secondary | ICD-10-CM | POA: Diagnosis not present

## 2018-07-09 DIAGNOSIS — M9901 Segmental and somatic dysfunction of cervical region: Secondary | ICD-10-CM | POA: Diagnosis not present

## 2018-07-09 DIAGNOSIS — M9902 Segmental and somatic dysfunction of thoracic region: Secondary | ICD-10-CM | POA: Diagnosis not present

## 2018-07-14 DIAGNOSIS — M542 Cervicalgia: Secondary | ICD-10-CM | POA: Diagnosis not present

## 2018-07-14 DIAGNOSIS — M9901 Segmental and somatic dysfunction of cervical region: Secondary | ICD-10-CM | POA: Diagnosis not present

## 2018-07-14 DIAGNOSIS — M546 Pain in thoracic spine: Secondary | ICD-10-CM | POA: Diagnosis not present

## 2018-07-14 DIAGNOSIS — M9902 Segmental and somatic dysfunction of thoracic region: Secondary | ICD-10-CM | POA: Diagnosis not present

## 2018-07-15 DIAGNOSIS — M546 Pain in thoracic spine: Secondary | ICD-10-CM | POA: Diagnosis not present

## 2018-07-15 DIAGNOSIS — M9902 Segmental and somatic dysfunction of thoracic region: Secondary | ICD-10-CM | POA: Diagnosis not present

## 2018-07-15 DIAGNOSIS — M542 Cervicalgia: Secondary | ICD-10-CM | POA: Diagnosis not present

## 2018-07-15 DIAGNOSIS — M9901 Segmental and somatic dysfunction of cervical region: Secondary | ICD-10-CM | POA: Diagnosis not present

## 2018-07-17 DIAGNOSIS — M9901 Segmental and somatic dysfunction of cervical region: Secondary | ICD-10-CM | POA: Diagnosis not present

## 2018-07-17 DIAGNOSIS — M542 Cervicalgia: Secondary | ICD-10-CM | POA: Diagnosis not present

## 2018-07-17 DIAGNOSIS — M546 Pain in thoracic spine: Secondary | ICD-10-CM | POA: Diagnosis not present

## 2018-07-17 DIAGNOSIS — M9902 Segmental and somatic dysfunction of thoracic region: Secondary | ICD-10-CM | POA: Diagnosis not present

## 2018-07-21 DIAGNOSIS — M546 Pain in thoracic spine: Secondary | ICD-10-CM | POA: Diagnosis not present

## 2018-07-21 DIAGNOSIS — M9902 Segmental and somatic dysfunction of thoracic region: Secondary | ICD-10-CM | POA: Diagnosis not present

## 2018-07-21 DIAGNOSIS — M9901 Segmental and somatic dysfunction of cervical region: Secondary | ICD-10-CM | POA: Diagnosis not present

## 2018-07-21 DIAGNOSIS — M542 Cervicalgia: Secondary | ICD-10-CM | POA: Diagnosis not present

## 2018-07-22 DIAGNOSIS — M546 Pain in thoracic spine: Secondary | ICD-10-CM | POA: Diagnosis not present

## 2018-07-22 DIAGNOSIS — M9902 Segmental and somatic dysfunction of thoracic region: Secondary | ICD-10-CM | POA: Diagnosis not present

## 2018-07-22 DIAGNOSIS — M542 Cervicalgia: Secondary | ICD-10-CM | POA: Diagnosis not present

## 2018-07-22 DIAGNOSIS — M9901 Segmental and somatic dysfunction of cervical region: Secondary | ICD-10-CM | POA: Diagnosis not present

## 2018-07-24 DIAGNOSIS — M546 Pain in thoracic spine: Secondary | ICD-10-CM | POA: Diagnosis not present

## 2018-07-24 DIAGNOSIS — M9901 Segmental and somatic dysfunction of cervical region: Secondary | ICD-10-CM | POA: Diagnosis not present

## 2018-07-24 DIAGNOSIS — M542 Cervicalgia: Secondary | ICD-10-CM | POA: Diagnosis not present

## 2018-07-24 DIAGNOSIS — M9902 Segmental and somatic dysfunction of thoracic region: Secondary | ICD-10-CM | POA: Diagnosis not present

## 2018-07-28 DIAGNOSIS — M9901 Segmental and somatic dysfunction of cervical region: Secondary | ICD-10-CM | POA: Diagnosis not present

## 2018-07-28 DIAGNOSIS — M542 Cervicalgia: Secondary | ICD-10-CM | POA: Diagnosis not present

## 2018-07-28 DIAGNOSIS — M9902 Segmental and somatic dysfunction of thoracic region: Secondary | ICD-10-CM | POA: Diagnosis not present

## 2018-07-28 DIAGNOSIS — M546 Pain in thoracic spine: Secondary | ICD-10-CM | POA: Diagnosis not present

## 2018-08-18 DIAGNOSIS — M546 Pain in thoracic spine: Secondary | ICD-10-CM | POA: Diagnosis not present

## 2018-08-18 DIAGNOSIS — M542 Cervicalgia: Secondary | ICD-10-CM | POA: Diagnosis not present

## 2018-08-18 DIAGNOSIS — M9901 Segmental and somatic dysfunction of cervical region: Secondary | ICD-10-CM | POA: Diagnosis not present

## 2018-08-18 DIAGNOSIS — M9902 Segmental and somatic dysfunction of thoracic region: Secondary | ICD-10-CM | POA: Diagnosis not present

## 2018-09-15 DIAGNOSIS — M9901 Segmental and somatic dysfunction of cervical region: Secondary | ICD-10-CM | POA: Diagnosis not present

## 2018-09-15 DIAGNOSIS — M546 Pain in thoracic spine: Secondary | ICD-10-CM | POA: Diagnosis not present

## 2018-09-15 DIAGNOSIS — M9902 Segmental and somatic dysfunction of thoracic region: Secondary | ICD-10-CM | POA: Diagnosis not present

## 2018-09-15 DIAGNOSIS — M542 Cervicalgia: Secondary | ICD-10-CM | POA: Diagnosis not present

## 2018-09-17 ENCOUNTER — Ambulatory Visit (INDEPENDENT_AMBULATORY_CARE_PROVIDER_SITE_OTHER): Payer: BC Managed Care – PPO | Admitting: Psychiatry

## 2018-09-17 ENCOUNTER — Encounter: Payer: Self-pay | Admitting: Psychiatry

## 2018-09-17 ENCOUNTER — Other Ambulatory Visit: Payer: Self-pay

## 2018-09-17 DIAGNOSIS — F411 Generalized anxiety disorder: Secondary | ICD-10-CM

## 2018-09-17 DIAGNOSIS — F902 Attention-deficit hyperactivity disorder, combined type: Secondary | ICD-10-CM

## 2018-09-17 DIAGNOSIS — F3341 Major depressive disorder, recurrent, in partial remission: Secondary | ICD-10-CM | POA: Diagnosis not present

## 2018-09-17 DIAGNOSIS — F900 Attention-deficit hyperactivity disorder, predominantly inattentive type: Secondary | ICD-10-CM | POA: Diagnosis not present

## 2018-09-17 DIAGNOSIS — F330 Major depressive disorder, recurrent, mild: Secondary | ICD-10-CM

## 2018-09-17 MED ORDER — AMPHETAMINE SULFATE 10 MG PO TABS: 10.0000 mg | ORAL_TABLET | Freq: Two times a day (BID) | ORAL | 0 refills | Status: DC

## 2018-09-17 NOTE — Progress Notes (Signed)
[name] [id number] 30-Oct-1985 33 y.o.  Virtual Visit via Video Note  I connected with@ on 09/17/18 at  8:00 AM EDT by a video enabled telemedicine application and verified that I am speaking with the correct person using two identifiers.   I discussed the limitations of evaluation and management by telemedicine and the availability of in person appointments. The patient expressed understanding and agreed to proceed.  I discussed the assessment and treatment plan with the patient. The patient was provided an opportunity to ask questions and all were answered. The patient agreed with the plan and demonstrated an understanding of the instructions.   The patient was advised to call back or seek an in-person evaluation if the symptoms worsen or if the condition fails to improve as anticipated.  I provided 30 minutes of non-face-to-face time during this encounter.  The patient was located at home.  The provider was located at home.   Corie Chiquito, PMHNP   Subjective:   Patient ID:  Mark Fritz is a 33 y.o. (DOB [birth year]) male.  Chief Complaint:  Chief Complaint  Patient presents with  . ADD  . Follow-up    h/o depression    HPI Mark Fritz presents to the office today for follow-up of ADD. He reports that he has had to take more one tab in the morning and a 1/2 tab mid-day. Reports taking 1/2 tab BID on weekends. He reports some mild depressive s/s for the last several weeks. He reports that he has felt some pressure to continue to make productivity goals at work and children having some difficulty adjusting adjusting to quarantine. He reports that low mood "is no longer distressing me" and is implementing coping strategies and "riding it out." He reports that anxiety "is what starts it all" and worry about meeting expectations. He reports that his sleep has been adequate and taking medication consistently has caused less sleep disturbance. He reports that exercise has been  helpful for his sleep. He reports that his energy has been adequate. Motivation has been ok and at times is lower when working from home. Appetite has been stable. Denies SI.   Has been using alarms on his Fit Bit to take second dose of Evekeo. Rarely taking OTC sleep aids.   Has worked remotely since March 13th.   Past Psychiatric Medication Trials: Sertraline-adverse effects Adderall- decreased appetite, increased irritability Evekeo- effective for attention deficit with out significant tolerability issues  Review of Systems:  Review of Systems  Cardiovascular: Negative for palpitations.  Musculoskeletal: Negative for gait problem.  Allergic/Immunologic: Positive for environmental allergies.  Neurological: Negative for tremors.  Psychiatric/Behavioral:       Please refer to HPI    Medications: I have reviewed the patient's current medications.  Current Outpatient Medications  Medication Sig Dispense Refill  . cetirizine (ZYRTEC) 10 MG tablet Take 10 mg by mouth daily.    Marland Kitchen doxylamine, Sleep, (UNISOM) 25 MG tablet Take 25 mg by mouth at bedtime as needed.    . Famotidine (PEPCID PO) Take by mouth daily as needed.    . Melatonin 3 MG TABS Take by mouth at bedtime as needed.    Melene Muller ON 12/10/2018] Amphetamine Sulfate (EVEKEO) 10 MG TABS Take 10 mg by mouth 2 (two) times daily for 30 days. Take 1/2-1 tab po BID 45 tablet 0  . [START ON 11/12/2018] Amphetamine Sulfate (EVEKEO) 10 MG TABS Take 10 mg by mouth 2 (two) times daily for 30 days. Take  1/2-1 tab po BID 45 tablet 0  . [START ON 10/15/2018] Amphetamine Sulfate (EVEKEO) 10 MG TABS Take 10 mg by mouth 2 (two) times daily for 30 days. Take 1/2-1 tab po BID 45 tablet 0   No current facility-administered medications for this visit.     Medication Side Effects: None  Allergies: No Known Allergies  Past Medical History:  Diagnosis Date  . ADHD (attention deficit hyperactivity disorder)   . De Quervain's tenosynovitis, right  12/2017  . Dental crowns present    and 1 implant  . History of gastric ulcer   . 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 Depression Mother   . Diabetes Mother   . Miscarriages / [place]   . Hearing loss Father   . Hypertension Father   . Mental illness Maternal Grandmother   . Hypertension Paternal Grandfather   . Stroke Paternal Grandfather   . Depression Sister     Social History   Socioeconomic History  . Marital status: Married    Spouse name: Not on file  . Number of children: Not on file  . Years of education: Not on file  . Highest education level: Not on file  Occupational History  . Not on file  Social Needs  . Financial resource strain: Not on file  . Food insecurity:    Worry: Not on file    Inability: Not on file  . Transportation needs:    Medical: Not on file    Non-medical: Not on file  Tobacco Use  . Smoking status: Never Smoker  . Smokeless tobacco: Never Used  Substance and Sexual Activity  . Alcohol use: No  . Drug use: No  . Sexual activity: Yes  Lifestyle  . Physical activity:    Days per week: Not on file    Minutes per session: Not on file  . Stress: Not on file  Relationships  . Social connections:    Talks on phone: Not on file    Gets together: Not on file    Attends religious service: Not on file    Active member of club or organization: Not on file    Attends meetings of clubs or organizations: Not on file    Relationship status: Not on file  . Intimate partner violence:    Fear of current or ex partner: Not on file    Emotionally abused: Not on file    Physically abused: Not on file    Forced sexual activity: Not on file  Other Topics Concern  . Not on file  Social History Narrative  . Not on file    Past Medical History, Surgical history, Social history, and Family history were reviewed and updated as appropriate.   Please see review of systems for  further details on the patient's review from today.   Objective:   Physical Exam:  There were no vitals taken for this visit.  Physical Exam Neurological:     Mental Status: He is alert and oriented to person, place, and time.     Cranial Nerves: No dysarthria.  Psychiatric:        Attention and Perception: Attention normal.        Speech: Speech normal.        Behavior: Behavior is cooperative.        Thought Content: Thought content normal. Thought content is not paranoid or delusional.  Thought content does not include homicidal or suicidal ideation. Thought content does not include homicidal or suicidal plan.        Cognition and Memory: Cognition and memory normal.        Judgment: Judgment normal.     Comments: Mood is appropriate to content.  Affect is congruent.     Lab Review:     Component Value Date/Time   NA 138 07/10/2017 0859   K 4.2 07/10/2017 0859   CL 101 07/10/2017 0859   CO2 30 07/10/2017 0859   GLUCOSE 107 (H) 07/10/2017 0859   BUN 18 07/10/2017 0859   CREATININE 1.04 07/10/2017 0859   CALCIUM 10.0 07/10/2017 0859   PROT 7.5 07/10/2017 0859   ALBUMIN 4.8 07/10/2017 0859   AST 21 07/10/2017 0859   ALT 30 07/10/2017 0859   ALKPHOS 49 07/10/2017 0859   BILITOT 0.5 07/10/2017 0859       Component Value Date/Time   WBC 5.4 07/10/2017 0859   RBC 5.39 07/10/2017 0859   HGB 15.6 07/10/2017 0859   HCT 45.0 07/10/2017 0859   PLT 250.0 07/10/2017 0859   MCV 83.5 07/10/2017 0859   MCHC 34.7 07/10/2017 0859   RDW 13.0 07/10/2017 0859   LYMPHSABS 2.1 07/10/2017 0859   MONOABS 0.4 07/10/2017 0859   EOSABS 0.1 07/10/2017 0859   BASOSABS 0.0 07/10/2017 0859    No results found for: POCLITH, LITHIUM   No results found for: PHENYTOIN, PHENOBARB, VALPROATE, CBMZ   .res Assessment: Plan:   Will continue of Evekeo 10 mg p.o. every morning and 1/2-1 tab in the afternoon as needed for attention deficit. Patient reports that mood signs and symptoms are mild  and manageable at this time.  Discussed potential benefits of vitamin D supplementation to improve mood and energy, as well as other health benefits associated with vitamin D.  Discussed that he may wish to consider adding vitamin D 1000 IU qd to determine if this is helpful for his mood and to protect against mild depressive episodes. Patient to follow-up in 4 months or sooner if clinically indicated. Patient advised to contact office with any questions, adverse effects, or acute worsening in signs and symptoms.  Recurrent major depressive disorder, in partial remission (HCC)  Attention deficit hyperactivity disorder (ADHD), combined type - Plan: Amphetamine Sulfate (EVEKEO) 10 MG TABS, Amphetamine Sulfate (EVEKEO) 10 MG TABS, Amphetamine Sulfate (EVEKEO) 10 MG TABS  Please see After Visit Summary for patient specific instructions.  No future appointments.  No orders of the defined types were placed in this encounter.     -------------------------------

## 2018-09-17 NOTE — Progress Notes (Signed)
Psychotherapy Progress Note [facility] Marliss CzarAndy Shayanna Thatch, PhD LP  Patient ID: Mark BourbonBryce R Cortner     MRN: [id number]     Therapy format: Individual psychotherapy Date: 09/17/2018     Start: 9:04a Stop: 9:54a Time Spent: 50 min  Telehealth visit I connected with patient by a video enabled telemedicine/telehealth application or telephone, with his informed consent, and verified patient privacy and that I am speaking with the correct person using two identifiers.  I was located at my home and patient at his home.  We discussed the limitations, risks, and security and privacy concerns associated with telehealth services and the availability of in-person appointments, including awareness that he may be responsible for charges related to the service, and he expressed understanding and agreed to proceed.  I discussed treatment planning with him, with opportunity to ask and answer all questions. Agreed with the plan, demonstrated an understanding of the instructions, and made him aware to call our office if symptoms worsen or he feels he is in a crisis state and needs immediate contact.  Session narrative (presenting needs, interim history, self-report of stressors and symptoms, applications of prior therapy, status changes, and interventions made in session) 1st psychotherapy visit under EHR, denies need for informed consent about change of format for record-keeping.  Went through another depressive downturn for several weeks after last seen earlier last year.  No SI, worked through it by continuing to remind himself the feelings are temporary and not rooted in urgent realities.  Stress picture continues with multiple family, occupational, and medical situations to manage.  Chart shows compliant maintenance medication Stann Mainland(Evekeo) followup with Ms. Montez Moritaarter throughout the time for ADHD.  Recently experienced another depressive downturn and surge of concern for his standing among peers at work and his  effect on family as he irritability and sense of threat have risen.  Attributes to COVID quarantine conditions working from home, wife [name]'s OCD (largely expressed in phobias), 33yo Mark Fritz's emerging OCD (e.g., phobias, like bug in his shirt, thinking pieces of apple falling on him from the air vent), eldest child's mild autism, and feeling personally like he has been running out of emotional energy.  Feels he is getting pushed into a dilemma between caring about family and caring about work as he receives direct and indirect demands on his time from work and worries frequently that he is doing emotional harm to his kids with smaller expressions of irritability.  Acknowledges compassion fatigue, when framed as so many things to care about and feel responsible for.  Worries that he will kind of go cold and not care enough, and how people will feel, whether they will jump to the conclusion that he doesn't care or that he means to be brusque or dismissive with them.  Assured that anyone who knows him (family) would not think it of him, but children, especially those with more delicate personalities, can take messages we don't intend as fathers and that we do have more power than we always realize to set tone with them.    Re. business interactions under time pressure, suggested he represent the need to hurry as explicitly and succinctly as possible, emphasizing 3rd person ("The rush is on right now") over 1st ("I need you to hurry") if he is worried about diplomacy.  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  An older  male did not seem as responsive but may have more of a problem with work ethic anyway, and PT has limited control over outside counsel anyway. With more Type colleagues from the [place], resolved that it may be more OK than he feels to range into brusqueness, that it could be more  culturally communicative with them, worth noting if he gets different response.  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  Affirmed his right to check definitions and negotiate protection from "mission creep".  Had a moment to check perceptions with boss recently about expectations, sees confused lines of authority.  Has successfully argued for certain things not to be his assignments, and he has gone without goals for a few months, enough to wonder if his job was at risk, and he asked about it.  Also sees a corporate culture dominated by urgency, sees risk of it collapsing.  Does have some acknowledgment that his job is secure, after asking if certain signs meant he is on the way out.  Discussed the calculated risk raising the subject, ultimately affirming frankness over assumptions, especially when depressive thinking can get involved and magnify.  Altogether, advocated frankness, constructive questions, short work of complaints/excuses, and standing ready to speak for the need to take time out if emotionally provocative,   With kids, who can be anxious, he feels risk of overreacting, but in large part finds himself over-guiding when kids get or feel hurt.  Feels risk of resenting the burden of comforting and managing their emotional responses.  In recounting how he addresses them, reveals a tendency to rush to explain that they will be OK.  Discussed content vs. process and chance of his delivery and choice to reassure as maybe paradoxically unsettling them, albeit in a small way, which actually may prolong what it takes to comfort.  Advocated OK to say if he has limited time but mainly try to just let them express something, offer  touch without pressing verbal reassurance, and let the quiet he can bring to it communicate non-urgency unless its clear they want dialogue.  Lead with empathy, work in how the distress is temporary if need be, an if it makes momentary sense, may use paradox (wife seems to be good at therapeutic mockery with one of the kids).  Wife had facial paralysis from shingles in her ear ([place]), two kids had cyst surgeries, adding to concerns and stresses.  Therapeutic modalities: Cognitive Behavioral Therapy, Assertiveness/Communication and Solution-Oriented/Positive Psychology  Mental Status/Observations:  Appearance:   Casual     Behavior:  Appropriate  Motor:  Normal  Speech/Language:   Clear and Coherent  Affect:  Appropriate  Mood:  anxious and responsive to humor, support, interaction  Thought process:  normal  Thought content:    WNL  Sensory/Perceptual disturbances:    WNL  Orientation:  grossly intact  Attention:  Good  Concentration:  Good  Memory:  WNL  Insight:    Good  Judgment:   Good  Impulse Control:  Good   Risk Assessment: Danger to Self:  No Self-injurious Behavior: No Danger to Others: No Duty to Warn:no Physical Aggression / Violence:No  Access to Firearms a concern: No   Diagnosis:  ICD-10-CM   1. Mild episode of recurrent major depressive disorder (HCC) F33.0   2. Attention deficit hyperactivity disorder (ADHD), predominantly inattentive type F90.0   3. Generalized anxiety disorder F41.1    Assessment of progress:  stable  Plan:  . With family distress, try to offer touch and let more touch, less talk/instruction suffice -- give feelings their time, don't try to rationalize much . With outside coworkers, trust more that they'll deal with minor abrupt moments . Where needed, let tension prompt him to represent the moment (more than the person -- "it" over "I/you") in speaking for inconveniences like being in a hurry; better to speak for "it" than let "it"  speak for you . Be ready to let some things be "lip service" issues when it comes to authorities' criticism; Pt is the expert on what can be perfect or close to it and what not in the time and energy he has, and some percentage of complaints are going to be situational/irrational rather than truly important or enduring, so identify minor corners that area actually OK to cut . OK to check perceptions with superiors -- questions are better than assumptions and reactions, ultimately . And where he finds it truly more of a "[place]" culture, it will be more OK to let a little brusqueness stand . Other recommendations/advice as noted above . Continue to utilize previously learned skills ad lib . Maintain medication as prescribed and work faithfully with relevant prescriber(s) if any changes are desired or seem indicated . Call the clinic on-call service, present to ER, or call 911 if any life-threatening psychiatric crisis Return in 27 days (on 10/14/2018) for option to do telehealth.   Robley Fries, PhD [place] Licensed Psychologist

## 2018-10-01 ENCOUNTER — Ambulatory Visit (INDEPENDENT_AMBULATORY_CARE_PROVIDER_SITE_OTHER): Payer: BC Managed Care – PPO | Admitting: Physician Assistant

## 2018-10-01 DIAGNOSIS — Z23 Encounter for immunization: Secondary | ICD-10-CM | POA: Diagnosis not present

## 2018-10-01 NOTE — Progress Notes (Signed)
Tdap given IM, left deltoid, pt tolerated well.   I agree with above plan and assessment.  Jarold Motto PA-C

## 2018-10-02 ENCOUNTER — Encounter: Payer: Self-pay | Admitting: Physician Assistant

## 2018-10-14 ENCOUNTER — Other Ambulatory Visit: Payer: Self-pay

## 2018-10-14 ENCOUNTER — Ambulatory Visit: Payer: BLUE CROSS/BLUE SHIELD | Admitting: Psychiatry

## 2018-10-22 ENCOUNTER — Ambulatory Visit (INDEPENDENT_AMBULATORY_CARE_PROVIDER_SITE_OTHER): Payer: BC Managed Care – PPO | Admitting: Psychiatry

## 2018-10-22 ENCOUNTER — Other Ambulatory Visit: Payer: Self-pay

## 2018-10-22 DIAGNOSIS — F900 Attention-deficit hyperactivity disorder, predominantly inattentive type: Secondary | ICD-10-CM

## 2018-10-22 DIAGNOSIS — F411 Generalized anxiety disorder: Secondary | ICD-10-CM | POA: Diagnosis not present

## 2018-10-22 DIAGNOSIS — Z636 Dependent relative needing care at home: Secondary | ICD-10-CM

## 2018-10-22 DIAGNOSIS — F330 Major depressive disorder, recurrent, mild: Secondary | ICD-10-CM

## 2018-10-22 NOTE — Progress Notes (Signed)
Psychotherapy Progress Note [facility] Luan Moore, PhD LP  Patient ID: Mark Fritz     MRN: [id number]     Therapy format: Individual psychotherapy Date: 10/22/2018     Start: 5:03p Stop: 5:53p Time Spent: 50 min Location: in-person   Session narrative (presenting needs, interim history, self-report of stressors and symptoms, applications of prior therapy, status changes, and interventions made in session) RS from last session, where Mark Fritz ran too late to make fair use of the occasion.    Insurance ([place]) has notified coverage for telehealth through end of December.  Would prefer to work by telehealth for time, comfort, and exposure for a while.  Approved.  Was very helpful to attend to the pace of how he addresses the kids, not rushing to "fix" their troubles or conflicts.  Paying off, shared it with wife.  Some improvements in dealing with Mark Fritz's OCD, Mark Fritz is stable.  Coping better with the stress of special-needs parenting.    Notwithstanding, did go through another depressive downturn seeing years of frustration.  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  Also notes aloneness at home for feeling the only one available to handle too many tasks.  That, plus Mark Fritz physical health and OCD (contagion, responsibility, and scrupulosity) have become taxing, as well as wife's hypersensitivity to news, not to mention his own ADHD exacerbated by multiple distractions in work-at-home conditions.  Validated/supported, and discussed possible solutions and options to assert further.  Hx 5-8yo living in [place].  Learned there to manage much in the way of environmental changes and limitations on communicating with the locals.  Therapeutic modalities: Cognitive Behavioral  Therapy and Solution-Oriented/Positive Psychology  Mental Status/Observations:  Appearance:   Casual     Behavior:  Appropriate  Motor:  Normal  Speech/Language:   Clear and Coherent  Affect:  Appropriate  Mood:  wearied, but essentially positive  Thought process:  normal  Thought content:    WNL  Sensory/Perceptual disturbances:    WNL  Orientation:  grossly intact  Attention:  Good  Concentration:  Good  Memory:  WNL  Insight:    Good  Judgment:   Good  Impulse Control:  Good   Risk Assessment: Danger to Self:  No Self-injurious Behavior: No Danger to Others: No Duty to Warn:no Physical Aggression / Violence:No  Access to Firearms a concern: No   Diagnosis:   ICD-10-CM   1. Generalized anxiety disorder  F41.1   2. Mild episode of recurrent major depressive disorder (HCC)  F33.0   3. Caregiver stress  Z63.6   4. Attention deficit hyperactivity disorder (ADHD), predominantly inattentive type  F90.0    Assessment of progress:  progressing  Plan:  . Continue to use pacing and limit "fixing" approach to kids' behavior, opting wherever possible for thought questions and recruiting juvenile problem-solving skills . Consider communication work and/or further coaching in handling wife's anxiety disorder . Endorsed setting limits, "office hours", possible signage for work-from-home to minimize distractions . Other recommendations/advice as noted above . Continue to utilize previously learned skills ad lib . Maintain medication as prescribed and work faithfully with relevant prescriber(s) if any changes are desired or seem indicated . Call the clinic on-call service, present to ER, or call 911 if any life-threatening psychiatric crisis Return for will call, set up as  teletherapy session.   Mark Friesobert Etter Royall, PhD Marliss CzarAndy Rhaelyn Giron, PhD LP Clinical Psychologist, [facility] [address], [address] [place], [place] [address] [phone](o)  [phone]

## 2018-11-19 ENCOUNTER — Telehealth: Payer: Self-pay | Admitting: Psychiatry

## 2018-11-19 DIAGNOSIS — F330 Major depressive disorder, recurrent, mild: Secondary | ICD-10-CM

## 2018-11-19 DIAGNOSIS — F411 Generalized anxiety disorder: Secondary | ICD-10-CM

## 2018-11-19 NOTE — Telephone Encounter (Signed)
Left voicemail to call back to discuss message. According to visit in January 2020, pt stopped Sertraline after taking for one week.  Appears he's requesting to get back on this.

## 2018-11-19 NOTE — Telephone Encounter (Signed)
Pt called to get info about taking Sertraline with Evekeo. Has Rx of Sertraline from [facility] and never started it. Need instructions on how to take with Evekoe. Please advise [phone]

## 2018-11-19 NOTE — Telephone Encounter (Signed)
He complained of side effects when starting sertraline 50 mg tablets one half daily for 7 days then 1 daily.  Therefore please prescribe sertraline 25 mg tablets one half daily for 5 days, 1 daily for 5 days, 1-1/2 daily for 7 days, then 2 tablets daily.  Call if he has any side effect issues

## 2018-11-21 NOTE — Telephone Encounter (Signed)
Left voice mail to call back 

## 2018-11-24 MED ORDER — SERTRALINE HCL 25 MG PO TABS: ORAL_TABLET | ORAL | 1 refills | Status: DC

## 2018-11-24 NOTE — Addendum Note (Signed)
Addended by: Sharyl Nimrod on: 11/24/2018 03:51 PM   Modules accepted: Orders

## 2018-11-24 NOTE — Telephone Encounter (Signed)
Left pt. A VM to return my call.

## 2018-11-25 NOTE — Telephone Encounter (Signed)
Tried calling pt. Again today. No answer so I left him another VM to return my call.

## 2018-11-27 NOTE — Telephone Encounter (Signed)
Pt. Has not returned my call. I left him another VM. Should I try again or just close out the message?

## 2019-01-21 DIAGNOSIS — Z20828 Contact with and (suspected) exposure to other viral communicable diseases: Secondary | ICD-10-CM | POA: Diagnosis not present

## 2019-02-03 ENCOUNTER — Other Ambulatory Visit: Payer: Self-pay

## 2019-02-03 ENCOUNTER — Ambulatory Visit (INDEPENDENT_AMBULATORY_CARE_PROVIDER_SITE_OTHER): Payer: BC Managed Care – PPO

## 2019-02-03 ENCOUNTER — Encounter: Payer: Self-pay | Admitting: Physician Assistant

## 2019-02-03 DIAGNOSIS — Z23 Encounter for immunization: Secondary | ICD-10-CM

## 2019-02-23 ENCOUNTER — Telehealth: Payer: Self-pay | Admitting: Psychiatry

## 2019-02-23 ENCOUNTER — Other Ambulatory Visit: Payer: Self-pay

## 2019-02-23 DIAGNOSIS — F902 Attention-deficit hyperactivity disorder, combined type: Secondary | ICD-10-CM

## 2019-02-23 DIAGNOSIS — Z20828 Contact with and (suspected) exposure to other viral communicable diseases: Secondary | ICD-10-CM | POA: Diagnosis not present

## 2019-02-23 MED ORDER — AMPHETAMINE SULFATE 10 MG PO TABS: ORAL_TABLET | ORAL | 0 refills | Status: DC

## 2019-02-23 NOTE — Telephone Encounter (Signed)
Last refill 01/14/2019

## 2019-02-23 NOTE — Telephone Encounter (Signed)
Patient need refill on Evekeo to be sent to [facility] on [address] patient has appt., 11/18 @ 10:30

## 2019-03-09 DIAGNOSIS — H5212 Myopia, left eye: Secondary | ICD-10-CM | POA: Diagnosis not present

## 2019-03-09 DIAGNOSIS — H02102 Unspecified ectropion of right lower eyelid: Secondary | ICD-10-CM | POA: Diagnosis not present

## 2019-03-09 DIAGNOSIS — H02101 Unspecified ectropion of right upper eyelid: Secondary | ICD-10-CM | POA: Diagnosis not present

## 2019-03-09 DIAGNOSIS — Q111 Other anophthalmos: Secondary | ICD-10-CM | POA: Diagnosis not present

## 2019-03-18 ENCOUNTER — Ambulatory Visit (INDEPENDENT_AMBULATORY_CARE_PROVIDER_SITE_OTHER): Payer: BC Managed Care – PPO | Admitting: Psychiatry

## 2019-03-18 ENCOUNTER — Other Ambulatory Visit: Payer: Self-pay

## 2019-03-18 ENCOUNTER — Encounter: Payer: Self-pay | Admitting: Psychiatry

## 2019-03-18 DIAGNOSIS — F902 Attention-deficit hyperactivity disorder, combined type: Secondary | ICD-10-CM | POA: Diagnosis not present

## 2019-03-18 MED ORDER — AMPHETAMINE SULFATE 10 MG PO TABS: ORAL_TABLET | ORAL | 0 refills | Status: DC

## 2019-03-18 NOTE — Progress Notes (Signed)
Mark Fritz [id number] [birth year] 33 y.o.  Subjective:   Patient ID:  Mark Fritz is a 33 y.o. (DOB 04/18/1986) male.  Chief Complaint:  Chief Complaint  Patient presents with  . Follow-up    ADD, Depression    HPI Mark Fritz presents to the office today for follow-up of ADD and Depression. He reports that he has found office space outside of the house and this was helpful for his concentration and stress. 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 He reports that he is frequently bored with current job and this makes concentration more challenging. He has noticed some depressive s/s over the last few days. Reports that he has been more cognizant of self-care and exercise regularly. Has been able to get up an exercise in the morning without difficulty. Notices some decrease in energy and motivation at work. Describes feeling "emotional energy" is lower, particularly with certain conversations and social interactions. He reports that he has been sleeping ok. Takes Unisom a couple of times a week to help with insomnia. Denies any significant anxiety. He reports that he has had some fluctuations in appetite. Denies SI.   Reports that Evekeo 10 mg po q am and 5 mg later in the day seems to be most effective for him without side effects. Reports that he has to take second dose of Evekeo by 12 noon to avoid insomnia.   Children are 10, 7, and 5 yo. Oldest child has asperger's and is taking classes remotely. Younger children are back in school.   Friend from church committed suicide last week. Remains well connected with church and supports there.  Past Psychiatric Medication Trials: Sertraline-adverse effects Adderall- decreased appetite, increased irritability Evekeo- effective for attention deficit with out significant tolerability issues  Review of Systems:  Review of Systems  Cardiovascular: Negative for palpitations.  Musculoskeletal:  Negative for gait problem.  Neurological: Negative for tremors.  Psychiatric/Behavioral:       Please refer to HPI    Medications: I have reviewed the patient's current medications.  Current Outpatient Medications  Medication Sig Dispense Refill  . [START ON 05/18/2019] Amphetamine Sulfate (EVEKEO) 10 MG TABS Take 10 mg by mouth every morning AND 5 mg daily in the afternoon. 45 tablet 0  . cetirizine (ZYRTEC) 10 MG tablet Take 10 mg by mouth daily.    Marland Kitchen doxylamine, Sleep, (UNISOM) 25 MG tablet Take 25 mg by mouth at bedtime as needed.    . Melatonin 3 MG TABS Take by mouth at bedtime as needed.    Melene Muller ON 04/20/2019] Amphetamine Sulfate (EVEKEO) 10 MG TABS Take 10 mg by mouth every morning AND 5 mg daily after lunch. Take 1/2-1 tab po BID. 45 tablet 0  . [START ON 03/23/2019] Amphetamine Sulfate (EVEKEO) 10 MG TABS Take 10 mg by mouth every morning AND 5 mg daily at 12 noon. Take 1/2-1 tab po BID. 30 tablet 0  . Famotidine (PEPCID PO) Take by mouth daily as needed.     No current facility-administered medications for this visit.     Medication Side Effects: None  Allergies: No Known Allergies  Past Medical History:  Diagnosis Date  . ADHD (attention deficit hyperactivity disorder)   . De Quervain's tenosynovitis, right 12/2017  . Dental crowns present    and 1 implant  . History of gastric ulcer   . 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 Depression Mother   . Diabetes Mother   . Miscarriages / [place] Mother   . Hearing loss Father   . Hypertension Father   . Mental illness Maternal Grandmother   . Hypertension Paternal Grandfather   . Stroke Paternal Grandfather   . Depression Sister     Social History   Socioeconomic History  . Marital status: Married    Spouse name: Not on file  . Number of children: Not on file  . Years of education: Not on file  . Highest education level: Not on file   Occupational History  . Not on file  Social Needs  . Financial resource strain: Not on file  . Food insecurity    Worry: Not on file    Inability: Not on file  . Transportation needs    Medical: Not on file    Non-medical: Not on file  Tobacco Use  . Smoking status: Never Smoker  . Smokeless tobacco: Never Used  Substance and Sexual Activity  . Alcohol use: No  . Drug use: No  . Sexual activity: Yes  Lifestyle  . Physical activity    Days per week: Not on file    Minutes per session: Not on file  . Stress: Not on file  Relationships  . Social Musicianconnections    Talks on phone: Not on file    Gets together: Not on file    Attends religious service: Not on file    Active member of club or organization: Not on file    Attends meetings of clubs or organizations: Not on file    Relationship status: Not on file  . Intimate partner violence    Fear of current or ex partner: Not on file    Emotionally abused: Not on file    Physically abused: Not on file    Forced sexual activity: Not on file  Other Topics Concern  . Not on file  Social History Narrative  . Not on file    Past Medical History, Surgical history, Social history, and Family history were reviewed and updated as appropriate.   Please see review of systems for further details on the patient's review from today.   Objective:   Physical Exam:  BP 120/80   Pulse 86   Physical Exam Constitutional:      General: He is not in acute distress.    Appearance: He is well-developed.  Musculoskeletal:        General: No deformity.  Neurological:     Mental Status: He is alert and oriented to person, place, and time.     Coordination: Coordination normal.  Psychiatric:        Attention and Perception: Attention and perception normal. He does not perceive auditory or visual hallucinations.        Mood and Affect: Mood is not anxious. Affect is not labile, blunt, angry or inappropriate.        Speech: Speech normal.         Behavior: Behavior normal.        Thought Content: Thought content normal. Thought content does not include homicidal or suicidal ideation. Thought content does not include homicidal or suicidal plan.        Cognition and Memory: Cognition and memory normal.        Judgment: Judgment normal.     Comments: Mood is mildly depressed Insight intact. No delusions.  Lab Review:     Component Value Date/Time   NA 138 07/10/2017 0859   K 4.2 07/10/2017 0859   CL 101 07/10/2017 0859   CO2 30 07/10/2017 0859   GLUCOSE 107 (H) 07/10/2017 0859   BUN 18 07/10/2017 0859   CREATININE 1.04 07/10/2017 0859   CALCIUM 10.0 07/10/2017 0859   PROT 7.5 07/10/2017 0859   ALBUMIN 4.8 07/10/2017 0859   AST 21 07/10/2017 0859   ALT 30 07/10/2017 0859   ALKPHOS 49 07/10/2017 0859   BILITOT 0.5 07/10/2017 0859       Component Value Date/Time   WBC 5.4 07/10/2017 0859   RBC 5.39 07/10/2017 0859   HGB 15.6 07/10/2017 0859   HCT 45.0 07/10/2017 0859   PLT 250.0 07/10/2017 0859   MCV 83.5 07/10/2017 0859   MCHC 34.7 07/10/2017 0859   RDW 13.0 07/10/2017 0859   LYMPHSABS 2.1 07/10/2017 0859   MONOABS 0.4 07/10/2017 0859   EOSABS 0.1 07/10/2017 0859   BASOSABS 0.0 07/10/2017 0859    No results found for: POCLITH, LITHIUM   No results found for: PHENYTOIN, PHENOBARB, VALPROATE, CBMZ   .res Assessment: Plan:   Will continue Evekeo 10 mg po q am and 5 mg po qd for ADHD.  Pt reports that his mood is mildly depressed and manageable at this point and would prefer not to start tx at this time for depression. Discussed adding Vit D to possibly improve mood. Pt to f/u in 3 months or sooner if clinically indicated. Patient advised to contact office with any questions, adverse effects, or acute worsening in signs and symptoms.  Mark Fritz was seen today for follow-up.  Diagnoses and all orders for this visit:  Attention deficit hyperactivity disorder (ADHD), combined type -     Amphetamine  Sulfate (EVEKEO) 10 MG TABS; Take 10 mg by mouth every morning AND 5 mg daily in the afternoon. -     Amphetamine Sulfate (EVEKEO) 10 MG TABS; Take 10 mg by mouth every morning AND 5 mg daily after lunch. Take 1/2-1 tab po BID. -     Amphetamine Sulfate (EVEKEO) 10 MG TABS; Take 10 mg by mouth every morning AND 5 mg daily at 12 noon. Take 1/2-1 tab po BID.     Please see After Visit Summary for patient specific instructions.  Future Appointments  Date Time Provider [place]  06/18/2019  8:30 AM Thayer Headings, PMHNP [facility] None    No orders of the defined types were placed in this encounter.   -------------------------------

## 2019-04-08 ENCOUNTER — Other Ambulatory Visit: Payer: Self-pay

## 2019-04-08 ENCOUNTER — Ambulatory Visit (INDEPENDENT_AMBULATORY_CARE_PROVIDER_SITE_OTHER): Payer: BC Managed Care – PPO | Admitting: Psychiatry

## 2019-04-08 DIAGNOSIS — F3341 Major depressive disorder, recurrent, in partial remission: Secondary | ICD-10-CM | POA: Diagnosis not present

## 2019-04-08 DIAGNOSIS — Z636 Dependent relative needing care at home: Secondary | ICD-10-CM

## 2019-04-08 DIAGNOSIS — F411 Generalized anxiety disorder: Secondary | ICD-10-CM

## 2019-04-08 NOTE — Progress Notes (Signed)
Psychotherapy Progress Note [facility] [name], PhD LP  Patient ID: Mark Fritz     MRN: [id number]     Therapy format: Individual psychotherapy Date: 04/08/2019      Start: 9:14a      Stop: 10:01a     Time Spent: 47 min Location: In-person   Session narrative (presenting needs, interim history, self-report of stressors and symptoms, applications of prior therapy, status changes, and interventions made in session) Continues to struggle with depressive periods.  Mostly out of this one, but realizes he is typically not particularly happy.  Sees his job as beneath him, pointless, and though well-paid requires him to do what he's not good at (having a lot of people be angry at him and triaging who gets attention), at the expense of doing what he is good at (persuasive and informative writing).  In a grind of putting out clear, informative emails, executives not doing what it takes, getting mad at him for them not doing the right things with it, and being told to write more clearly (unrealistic, as he already does).  Sees too much CYA, too much creating "dog and pony shows", and attending redundant meetings to assure execs of risk management that is already well under control.  Knows he can't sell out to the 16-hr days his immediate supervisor does.  Tends to hear from colleagues about things that don't make sense departmentally.  Finds the head of department (general counsel) a Paediatric nurse but a likable person.  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  Meanwhile, it is a grind at home dealing with W Hailey's OCD.  "We make a decision 100 times", which is draining and sometimes quietly infuriating.  Some hope with her going back to school, may  mean she stays more occupied with "real" concerns rather than inventing them.  Plus, she "helicopters" the kids.  It has been a helpful paradigm shift as a father to decidedly stop rushing the kids through their own distress.  Offered that socratic questions might also work better with Hailey's OCD if she is asking reassurance, though this part has toned down.   Reveals he gets jealous of colleagues with reportedly lower-maintenance wives.  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  Resolved to ask W to know that he gets spent at work and really needs relief from   [facility] acquaintance Hospital doctor, in fact) suicided a few weeks ago (shot himself at father's grave).  Unfortunate, shocking in its own right.  Support provided.  Denies any influence on him to despair.  Looking at buying new house, for more space and a more separable home office.  Currently telecommuting from his parents' spare bedroom so as to keep an effective boundary vs. family interruptions.  Affirmed boundary-keeping and the usual telecommuting wisdom of establishing a close-able space at home, option to use signage (e.g., "Dad/Husband at work"), and confirm understanding that in earshot may still need to be treated the same as not even being home. Agrees, will explore.  Therapeutic modalities: Cognitive Behavioral Therapy and Solution-Oriented/Positive Psychology  Mental  Status/Observations:  Appearance:   Casual     Behavior:  Appropriate  Motor:  Normal  Speech/Language:   Clear and Coherent  Affect:  Appropriate and slight tearing at one point  Mood:  depressed  Thought process:  normal  Thought content:    some rumnation/worry  Sensory/Perceptual disturbances:    WNL  Orientation:  Fully oriented  Attention:  Good  Concentration:  Good   Memory:  WNL  Insight:    Good  Judgment:   Good  Impulse Control:  Good   Risk Assessment: Danger to Self: No Self-injurious Behavior: No Danger to Others: No Physical Aggression / Violence: No Duty to Warn: No Access to Firearms a concern: No  Assessment of progress:  situational setback(s)  Diagnosis:   ICD-10-CM   1. Recurrent major depressive disorder, in partial remission ([place])  F33.41   2. Generalized anxiety disorder  F41.1   3. Caregiver stress  Z63.6    Plan:  . Continue working out more Tour manager for [employer]  . Conversation with wife about the need to economize on extra questions and re-deciding things, be frank and vulnerable without indulging anger re how it wears on him to undo/redo decisions and trust her to understand . Other recommendations/advice as noted above . Continue to utilize previously learned skills ad lib . Maintain medication as prescribed and work faithfully with relevant prescriber(s) if any changes are desired or seem indicated . Call the clinic on-call service, present to ER, or call 911 if any life-threatening psychiatric crisis Return for time as available.  Blanchie Serve, PhD Luan Moore, PhD LP Clinical Psychologist, [facility] [address] [place], [place] [address] [phone]

## 2019-05-06 ENCOUNTER — Ambulatory Visit (INDEPENDENT_AMBULATORY_CARE_PROVIDER_SITE_OTHER): Payer: BC Managed Care – PPO | Admitting: Psychiatry

## 2019-05-06 ENCOUNTER — Other Ambulatory Visit: Payer: Self-pay

## 2019-05-06 DIAGNOSIS — F3341 Major depressive disorder, recurrent, in partial remission: Secondary | ICD-10-CM | POA: Diagnosis not present

## 2019-05-06 DIAGNOSIS — Z636 Dependent relative needing care at home: Secondary | ICD-10-CM

## 2019-05-06 DIAGNOSIS — F411 Generalized anxiety disorder: Secondary | ICD-10-CM | POA: Diagnosis not present

## 2019-05-06 NOTE — Progress Notes (Signed)
Psychotherapy Progress Note [facility] Luan Moore, PhD LP  Patient ID: Mark Fritz     MRN: [id number]     Therapy format: Individual psychotherapy Date: 05/06/2019      Start: 8:14a     Stop: 9:02a     Time Spent: 48 min Location: In-person   Session narrative (presenting needs, interim history, self-report of stressors and symptoms, applications of prior therapy, status changes, and interventions made in session) Holidays initially pleasant, with family visiting, overshadowed by Brother giving blood, passing out, hitting a countertop, sustaining blunt-force trauma to his throat and an esophageal injury which abscessed near carotid artery, requiring hospitalization.  Touch-and-go for a time, well-cared-for, made better than expected recovery and released 12/31.  Has meant a lot of family caregiving with his children, whom PT notes have not seen a lot of discipline.  Pleasant surprise to see his autistic son respond well to the influx of people in the home and show himself a quick study at the social skill of facetious criticism and kidding around with cousins.    Alliance with [name], feels a good level of understanding and partnership these days.  Had the talk about needing relief from remaking so many decisions, and she was quite understanding, has been more mindful and temperate about it.  Personally, has been changing his leisure focus and taking on more intellectually stimulating podcasts.  Getting some jogs in again, which helps, and finding out he can be as well nourished by listening to ideas as by the music he used to feel he needed to get through.  Philosophically, feels better about using his mind more, though he still loves jazz/blues.  Intentionally leaving out things he finds more mindless in his free time in favor of stimulating thought.  Work and its dissatisfactions remain the main issue.  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  And underutilized, still, as an attorney.  Has a drudgery role with business meeting notes.  Considering whether it's better to settle or seek.  Have saved well for retirement in this role, recognizes passion may be more affordable, though in its previous incarnation he was not free to be a family man.  Affirmed/supported both sides of his values dilemma.  One idea offered to write himself an extra line as from the Beatitudes -- "Blessed are the overlooked, for ____" to refresh promises from his faith (LDS) and make meaning that sustains regardless of whether he gets stimulated, noticed, or responded to.    Therapeutic modalities: Cognitive Behavioral Therapy, Solution-Oriented/Positive Psychology, Narrative and Faith-sensitive  Mental Status/Observations:  Appearance:   Casual     Behavior:  Appropriate  Motor:  Normal  Speech/Language:   Clear and Coherent  Affect:  Appropriate  Mood:  dysthymic  Thought process:  normal  Thought content:    WNL  Sensory/Perceptual disturbances:    WNL  Orientation:  Fully oriented  Attention:  Good  Concentration:  Good  Memory:  WNL  Insight:    Good  Judgment:   Good  Impulse Control:  Good   Risk Assessment: Danger to Self: No Self-injurious Behavior: No Danger to Others: No Physical Aggression / Violence: No Duty to Warn: No Access to Firearms a concern: No  Assessment of progress:  stabilized  Diagnosis:   ICD-10-CM   1. Recurrent major depressive disorder,  in partial remission ([facility])  F33.41   2. Caregiver stress  Z63.6   3. Generalized anxiety disorder  F41.1    Plan:  . Option to write his own Beatitude for self-validation . Reframe career dilemma as two right answers . If staying with more unfulfilling role, see about developing non-career interests, e.g., teaching through church or intellectually stimulating  listening . Other recommendations/advice as noted above . Continue to utilize previously learned skills ad lib . Maintain medication as prescribed and work faithfully with relevant prescriber(s) if any changes are desired or seem indicated . Call the clinic on-call service, present to ER, or call 911 if any life-threatening psychiatric crisis Return in about 1 month (around 06/06/2019) for time as available.  Robley Fries, PhD [name], PhD LP Clinical Psychologist, [facility] [address], [address] [place], [place] [address] [phone]

## 2019-05-29 ENCOUNTER — Other Ambulatory Visit: Payer: Self-pay

## 2019-05-29 ENCOUNTER — Ambulatory Visit (INDEPENDENT_AMBULATORY_CARE_PROVIDER_SITE_OTHER): Payer: BC Managed Care – PPO | Admitting: Physician Assistant

## 2019-05-29 ENCOUNTER — Encounter: Payer: Self-pay | Admitting: Physician Assistant

## 2019-05-29 ENCOUNTER — Telehealth: Payer: Self-pay | Admitting: Physician Assistant

## 2019-05-29 VITALS — BP 110/76 | HR 77 | Temp 97.7°F | Ht 67.0 in | Wt 203.0 lb

## 2019-05-29 DIAGNOSIS — R220 Localized swelling, mass and lump, head: Secondary | ICD-10-CM | POA: Diagnosis not present

## 2019-05-29 DIAGNOSIS — R599 Enlarged lymph nodes, unspecified: Secondary | ICD-10-CM

## 2019-05-29 MED ORDER — AMOXICILLIN-POT CLAVULANATE 875-125 MG PO TABS: 1.0000 | ORAL_TABLET | Freq: Two times a day (BID) | ORAL | 0 refills | Status: DC

## 2019-05-29 NOTE — Progress Notes (Signed)
Mark Fritz is a 34 y.o. male here for a new problem.  I acted as a Neurosurgeon for [employer], PA-C Corky Mull, LPN   History of Present Illness:   Chief Complaint  Patient presents with  . Facial Swelling    HPI   Facial swelling Pt c/o facial swelling left side of face by ear radiating into cheek. Pt says there is a small nodule by ear. Tender to touch. He had a telehealth visit Thursday morning and recommend heat, ibuprofen.  Denies: Ear pain, tooth pain, mouth pain, fever, chills, malaise, shortness of breath, discharge from ear, severe pain, known trauma  Area was marked more swollen this morning has improved throughout the day.  Past Medical History:  Diagnosis Date  . ADHD (attention deficit hyperactivity disorder)   . De Quervain's tenosynovitis, right 12/2017  . Dental crowns present    and 1 implant  . History of gastric ulcer   . 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 Marital status: Married    Spouse name: Not on file  . Number of children: Not on file  . Years of education: Not on file  . Highest education level: Not on file  Occupational History  . Not on file  Tobacco Use  . Smoking status: Never Smoker  . Smokeless tobacco: Never Used  Substance and Sexual Activity  . Alcohol use: No  . Drug use: No  . Sexual activity: Yes  Other Topics Concern  . Not on file  Social History Narrative  . Not on file   Social Determinants of Health   Financial Resource Strain:   . Difficulty of Paying Living Expenses: Not on file  Food Insecurity:   . Worried About Programme researcher, broadcasting/film/video in the Last Year: Not on file  . Ran Out of Food in the Last Year: Not on file  Transportation Needs:   . Lack of Transportation (Medical): Not on file  . Lack of Transportation (Non-Medical): Not on file  Physical Activity:   . Days of Exercise per Week: Not on file  . Minutes of Exercise per Session: Not on file   Stress:   . Feeling of Stress : Not on file  Social Connections:   . Frequency of Communication with Friends and Family: Not on file  . Frequency of Social Gatherings with Friends and Family: Not on file  . Attends Religious Services: Not on file  . Active Member of Clubs or Organizations: Not on file  . Attends Banker Meetings: Not on file  . Marital Status: Not on file  Intimate Partner Violence:   . Fear of Current or Ex-Partner: Not on file  . Emotionally Abused: Not on file  . Physically Abused: Not on file  . Sexually Abused: Not on file    Past Surgical History:  Procedure Laterality Date  . BLEPHAROPLASTY Right   . BUNIONECTOMY Left 2008  . DORSAL COMPARTMENT RELEASE Right 01/14/2018   Procedure: RIGHT WRIST RELEASE DORSAL COMPARTMENT (DEQUERVAIN);  Surgeon: Cindee Salt, MD;  Location: [facility];  Service: Orthopedics;  Laterality: Right;    Family History  Problem Relation Age of Onset  . Depression Mother   . Diabetes Mother   . Miscarriages / [place] Mother   . Hearing loss Father   . Hypertension Father   . Mental illness Maternal Grandmother   . Hypertension Paternal Grandfather   .  Stroke Paternal Grandfather   . Depression Sister     No Known Allergies  Current Medications:   Current Outpatient Medications:  .  Amphetamine Sulfate (EVEKEO) 10 MG TABS, Take 10 mg by mouth every morning AND 5 mg daily in the afternoon., Disp: 45 tablet, Rfl: 0 .  cetirizine (ZYRTEC) 10 MG tablet, Take 10 mg by mouth daily., Disp: , Rfl:  .  doxylamine, Sleep, (UNISOM) 25 MG tablet, Take 25 mg by mouth at bedtime as needed., Disp: , Rfl:  .  Famotidine (PEPCID PO), Take by mouth daily as needed., Disp: , Rfl:  .  Melatonin 3 MG TABS, Take by mouth at bedtime as needed., Disp: , Rfl:  .  amoxicillin-clavulanate (AUGMENTIN) 875-125 MG tablet, Take 1 tablet by mouth 2 (two) times daily., Disp: 20 tablet, Rfl: 0 .  Amphetamine Sulfate (EVEKEO)  10 MG TABS, Take 10 mg by mouth every morning AND 5 mg daily after lunch. Take 1/2-1 tab po BID., Disp: 45 tablet, Rfl: 0 .  Amphetamine Sulfate (EVEKEO) 10 MG TABS, Take 10 mg by mouth every morning AND 5 mg daily at 12 noon. Take 1/2-1 tab po BID., Disp: 30 tablet, Rfl: 0   Review of Systems:   ROS Negative unless otherwise specified per HPI.  Vitals:   Vitals:   05/29/19 1442  BP: 110/76  Pulse: 77  Temp: 97.7 F (36.5 C)  TempSrc: Temporal  SpO2: 95%  Weight: 203 lb (92.1 kg)  Height: 5\' 7"  (1.702 m)     Body mass index is 31.79 kg/m.  Physical Exam:   Physical Exam Vitals and nursing note reviewed.  Constitutional:      General: He is not in acute distress.    Appearance: He is well-developed. He is not ill-appearing or toxic-appearing.  HENT:     Head:     Jaw: No trismus, pain on movement or malocclusion.  Cardiovascular:     Rate and Rhythm: Normal rate and regular rhythm.     Pulses: Normal pulses.     Heart sounds: Normal heart sounds, S1 normal and S2 normal.     Comments: No LE edema Pulmonary:     Effort: Pulmonary effort is normal.     Breath sounds: Normal breath sounds.  Lymphadenopathy:     Head:     Left side of head: Preauricular and posterior auricular adenopathy present.  Skin:    General: Skin is warm and dry.     Comments: Very mild swelling to left mandible preauricular area.  Neurological:     Mental Status: He is alert.     GCS: GCS eye subscore is 4. GCS verbal subscore is 5. GCS motor subscore is 6.  Psychiatric:        Speech: Speech normal.        Behavior: Behavior normal. Behavior is cooperative.      Assessment and Plan:   [name] was seen today for facial swelling.  Diagnoses and all orders for this visit:  Jaw swelling; Lymph nodes enlarged Unclear etiology of symptoms, question infection.  No red flags on discussion and patient is not in any apparent distress today.  Symptoms seem to be improving throughout the day  today, and he is agreeable to a watchful waiting approach to see how this plays out.  We reviewed worsening precautions and recommended that we continue Aleve as long as he is taking his famotidine to help protect his stomach.  Recommended cold compresses and I did give  him a safety net prescription of Augmentin should he need it.  I also gave him written worsening precautions.  I instructed him to follow-up in our office if it does not improve.  Patient verbalized understanding to plan.  Other orders -     amoxicillin-clavulanate (AUGMENTIN) 875-125 MG tablet; Take 1 tablet by mouth 2 (two) times daily.  . Reviewed expectations re: course of current medical issues. . Discussed self-management of symptoms. . Outlined signs and symptoms indicating need for more acute intervention. . Patient verbalized understanding and all questions were answered. . See orders for this visit as documented in the electronic medical record. . Patient received an After-Visit Summary.  CMA or LPN served as scribe during this visit. History, Physical, and Plan performed by medical provider. The above documentation has been reviewed and is accurate and complete.   Jarold Motto, PA-C

## 2019-05-29 NOTE — Telephone Encounter (Signed)
ERROR

## 2019-05-29 NOTE — Patient Instructions (Signed)
It was great to see you!  Ice compresses as needed.  Aleve as needed and famotidine daily while on aleve.  Augmentin oral antibiotic if swelling worsens, you develop fever, chills, generally feeling unwell.   Follow these instructions at home:  Get plenty of rest.  Take over-the-counter and prescription medicines only as told by your health care provider. Your health care provider may recommend over-the-counter medicines for pain.  If directed, apply heat to swollen lymph glands as often as told by your health care provider. Use the heat source that your health care provider recommends, such as a moist heat pack or a heating pad. ? Place a towel between your skin and the heat source. ? Leave the heat on for 20-30 minutes. ? Remove the heat if your skin turns bright red. This is especially important if you are unable to feel pain, heat, or cold. You may have a greater risk of getting burned.  Check your affected lymph glands every day for changes. Check other lymph gland areas as told by your health care provider. Check for changes such as: ? More swelling. ? Sudden increase in size. ? Redness or pain. ? Hardness.  Keep all follow-up visits as told by your health care provider. This is important. Contact a health care provider if you have:  Swelling that gets worse or spreads to other areas.  Problems with breathing.  Lymph glands that: ? Are still swollen after 2 weeks. ? Have suddenly gotten bigger. ? Are red, painful, or hard.  A fever or chills.  Fatigue.  A sore throat.  Pain in your abdomen.  Weight loss.  Night sweats. Get help right away if you have:  Fluid leaking from an enlarged lymph gland.  Severe pain.  Chest pain.  Shortness of breath. Summary  Lymphadenopathy means that your lymph glands are swollen or larger than normal (enlarged).  Lymph glands (also called lymph nodes) are collections of tissue that filter bacteria, viruses, and waste  from the bloodstream. They are part of your body's disease-fighting system (immune system).  Lymphadenopathy can occur anywhere that you have lymph glands.  If your enlarged and swollen lymph glands do not go back to normal after you have an infection or disease, your health care provider may do tests to monitor your condition and find the reason why the glands are still swollen and enlarged.  Check your affected lymph glands every day for changes. Check other lymph gland areas as told by your health care provider. This information is not intended to replace advice given to you by your health care provider. Make sure you discuss any questions you have with your health care provider. Document Revised: 03/29/2017 Document Reviewed: 03/01/2017 Elsevier Patient Education  2020 [employer].

## 2019-06-01 ENCOUNTER — Encounter: Payer: Self-pay | Admitting: Physician Assistant

## 2019-06-01 ENCOUNTER — Other Ambulatory Visit: Payer: Self-pay

## 2019-06-01 ENCOUNTER — Ambulatory Visit (INDEPENDENT_AMBULATORY_CARE_PROVIDER_SITE_OTHER): Payer: BC Managed Care – PPO | Admitting: Physician Assistant

## 2019-06-01 ENCOUNTER — Telehealth: Payer: Self-pay | Admitting: Physician Assistant

## 2019-06-01 VITALS — BP 118/80 | HR 81 | Temp 98.2°F | Ht 67.0 in | Wt 203.0 lb

## 2019-06-01 DIAGNOSIS — H02846 Edema of left eye, unspecified eyelid: Secondary | ICD-10-CM

## 2019-06-01 MED ORDER — VALACYCLOVIR HCL 1 G PO TABS: 1000.0000 mg | ORAL_TABLET | Freq: Three times a day (TID) | ORAL | 0 refills | Status: AC

## 2019-06-01 NOTE — Telephone Encounter (Signed)
Pt came into the office and was seen.

## 2019-06-01 NOTE — Telephone Encounter (Signed)
Pt called [facility] on 05/31/2019 and stated he was seen in the office on Friday for swelling around his ear and face. Pt stated swelling has now moved to around eye and is not sure what to do. Nurse from [facility] advised pt to be seen by PCP or go to Urgent Care. Please advise.

## 2019-06-01 NOTE — Progress Notes (Signed)
Mark Fritz is a 34 y.o. male here for a follow up of a pre-existing problem.  I acted as a Education administrator for [employer], PA-C Anselmo Pickler, LPN  History of Present Illness:   Chief Complaint  Patient presents with  . Facial Swelling    HPI   Facial swelling  Patient was seen by me on 05/29/2019 for lymphadenopathy and slight jaw swelling.  At that time he was suspected to have likely some viral infection, and was given a safety net prescription of Augmentin.  He says since that time he started the Augmentin, and the swelling around his eyes has gotten worse.  He also has a lesion above his left eyebrow that he was able to express purulent drainage from.  Overall his symptoms have worsened except for his pain which has improved.  Denies any issues with vision, hearing, ear pain.  He is also taking ibuprofen.  He denies any fevers.   Past Medical History:  Diagnosis Date  . ADHD (attention deficit hyperactivity disorder)   . De Quervain's tenosynovitis, right 12/2017  . Dental crowns present    and 1 implant  . History of gastric ulcer   . 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 Marital status: Married    Spouse name: Not on file  . Number of children: Not on file  . Years of education: Not on file  . Highest education level: Not on file  Occupational History  . Not on file  Tobacco Use  . Smoking status: Never Smoker  . Smokeless tobacco: Never Used  Substance and Sexual Activity  . Alcohol use: No  . Drug use: No  . Sexual activity: Yes  Other Topics Concern  . Not on file  Social History Narrative  . Not on file   Social Determinants of Health   Financial Resource Strain:   . Difficulty of Paying Living Expenses: Not on file  Food Insecurity:   . Worried About Charity fundraiser in the Last Year: Not on file  . Ran Out of Food in the Last Year: Not on file  Transportation Needs:   . Lack of  Transportation (Medical): Not on file  . Lack of Transportation (Non-Medical): Not on file  Physical Activity:   . Days of Exercise per Week: Not on file  . Minutes of Exercise per Session: Not on file  Stress:   . Feeling of Stress : Not on file  Social Connections:   . Frequency of Communication with Friends and Family: Not on file  . Frequency of Social Gatherings with Friends and Family: Not on file  . Attends Religious Services: Not on file  . Active Member of Clubs or Organizations: Not on file  . Attends Archivist Meetings: Not on file  . Marital Status: Not on file  Intimate Partner Violence:   . Fear of Current or Ex-Partner: Not on file  . Emotionally Abused: Not on file  . Physically Abused: Not on file  . Sexually Abused: Not on file    Past Surgical History:  Procedure Laterality Date  . BLEPHAROPLASTY Right   . BUNIONECTOMY Left 2008  . DORSAL COMPARTMENT RELEASE Right 01/14/2018   Procedure: RIGHT WRIST RELEASE DORSAL COMPARTMENT (DEQUERVAIN);  Surgeon: Daryll Brod, MD;  Location: [place];  Service: Orthopedics;  Laterality: Right;    Family History  Problem Relation Age of  Onset  . Depression Mother   . Diabetes Mother   . Miscarriages / [place] Mother   . Hearing loss Father   . Hypertension Father   . Mental illness Maternal Grandmother   . Hypertension Paternal Grandfather   . Stroke Paternal Grandfather   . Depression Sister     No Known Allergies  Current Medications:   Current Outpatient Medications:  .  amoxicillin-clavulanate (AUGMENTIN) 875-125 MG tablet, Take 1 tablet by mouth 2 (two) times daily., Disp: 20 tablet, Rfl: 0 .  Amphetamine Sulfate (EVEKEO) 10 MG TABS, Take 10 mg by mouth every morning AND 5 mg daily in the afternoon., Disp: 45 tablet, Rfl: 0 .  cetirizine (ZYRTEC) 10 MG tablet, Take 10 mg by mouth daily., Disp: , Rfl:  .  doxylamine, Sleep, (UNISOM) 25 MG tablet, Take 25 mg by mouth at bedtime as  needed., Disp: , Rfl:  .  Famotidine (PEPCID PO), Take by mouth daily as needed., Disp: , Rfl:  .  Melatonin 3 MG TABS, Take by mouth at bedtime as needed., Disp: , Rfl:  .  Amphetamine Sulfate (EVEKEO) 10 MG TABS, Take 10 mg by mouth every morning AND 5 mg daily after lunch. Take 1/2-1 tab po BID., Disp: 45 tablet, Rfl: 0 .  Amphetamine Sulfate (EVEKEO) 10 MG TABS, Take 10 mg by mouth every morning AND 5 mg daily at 12 noon. Take 1/2-1 tab po BID., Disp: 30 tablet, Rfl: 0 .  valACYclovir (VALTREX) 1000 MG tablet, Take 1 tablet (1,000 mg total) by mouth 3 (three) times daily for 7 days., Disp: 21 tablet, Rfl: 0   Review of Systems:   ROS  Negative unless otherwise specified per HPI.   Vitals:   Vitals:   06/01/19 1008  BP: 118/80  Pulse: 81  Temp: 98.2 F (36.8 C)  TempSrc: Temporal  SpO2: 98%  Weight: 203 lb (92.1 kg)  Height: 5\' 7"  (1.702 m)     Body mass index is 31.79 kg/m.  Physical Exam:   Physical Exam Vitals and nursing note reviewed.  Constitutional:      Appearance: He is well-developed.  HENT:     Head: Normocephalic.     Comments: Swelling to lateral left eyelid upper and lower, small erythematous lesion at medial left eyebrow with very slight tenderness to palpation. Eyes:     Conjunctiva/sclera: Conjunctivae normal.     Pupils: Pupils are equal, round, and reactive to light.  Pulmonary:     Effort: Pulmonary effort is normal.  Musculoskeletal:        General: Normal range of motion.     Cervical back: Normal range of motion.  Lymphadenopathy:     Head:     Left side of head: Preauricular and posterior auricular adenopathy present.  Skin:    General: Skin is warm and dry.  Neurological:     Mental Status: He is alert and oriented to person, place, and time.  Psychiatric:        Behavior: Behavior normal.        Thought Content: Thought content normal.        Judgment: Judgment normal.      Assessment and Plan:   Mark Fritz was seen today for  facial swelling.  Diagnoses and all orders for this visit:  Swelling of left eyelid  Other orders -     valACYclovir (VALTREX) 1000 MG tablet; Take 1 tablet (1,000 mg total) by mouth 3 (three) times daily for 7 days.  Patient was also evaluated by Dr. Jacquiline Doe.  Will trial a round of Valtrex in addition to his Augmentin to cover for possible herpetic etiology.  This is the only functioning eye he has, so I strongly recommended that he get in touch with his ophthalmologist today so they can further evaluate his eye to make sure there is no presence of the virus or other concerns in his eye.  Patient verbalized understanding to plan.  Worsening precautions advised.  . Reviewed expectations re: course of current medical issues. . Discussed self-management of symptoms. . Outlined signs and symptoms indicating need for more acute intervention. . Patient verbalized understanding and all questions were answered. . See orders for this visit as documented in the electronic medical record. . Patient received an After-Visit Summary.  CMA or LPN served as scribe during this visit. History, Physical, and Plan performed by medical provider. The above documentation has been reviewed and is accurate and complete.  Jarold Motto, PA-C

## 2019-06-03 DIAGNOSIS — H02101 Unspecified ectropion of right upper eyelid: Secondary | ICD-10-CM | POA: Diagnosis not present

## 2019-06-03 DIAGNOSIS — B0059 Other herpesviral disease of eye: Secondary | ICD-10-CM | POA: Diagnosis not present

## 2019-06-03 DIAGNOSIS — H02102 Unspecified ectropion of right lower eyelid: Secondary | ICD-10-CM | POA: Diagnosis not present

## 2019-06-03 DIAGNOSIS — Q111 Other anophthalmos: Secondary | ICD-10-CM | POA: Diagnosis not present

## 2019-06-18 ENCOUNTER — Ambulatory Visit: Payer: BC Managed Care – PPO | Admitting: Psychiatry

## 2019-06-29 ENCOUNTER — Other Ambulatory Visit: Payer: Self-pay

## 2019-06-29 ENCOUNTER — Telehealth: Payer: Self-pay | Admitting: Psychiatry

## 2019-06-29 DIAGNOSIS — F902 Attention-deficit hyperactivity disorder, combined type: Secondary | ICD-10-CM

## 2019-06-29 NOTE — Telephone Encounter (Signed)
Pt is needing refill on EVEKEO 10 MG. Pt took last pill this morning. Please send to [facility] on [address].

## 2019-06-29 NOTE — Telephone Encounter (Signed)
Last refill 05/25/2019, next apt 07/10/2019 Pended for Shanda Bumps to submit

## 2019-06-30 MED ORDER — AMPHETAMINE SULFATE 10 MG PO TABS: ORAL_TABLET | ORAL | 0 refills | Status: DC

## 2019-07-10 ENCOUNTER — Ambulatory Visit (INDEPENDENT_AMBULATORY_CARE_PROVIDER_SITE_OTHER): Payer: BC Managed Care – PPO | Admitting: Psychiatry

## 2019-07-10 ENCOUNTER — Encounter: Payer: Self-pay | Admitting: Psychiatry

## 2019-07-10 DIAGNOSIS — F902 Attention-deficit hyperactivity disorder, combined type: Secondary | ICD-10-CM

## 2019-07-10 MED ORDER — AMPHETAMINE SULFATE 10 MG PO TABS: ORAL_TABLET | ORAL | 0 refills | Status: DC

## 2019-07-10 NOTE — Progress Notes (Signed)
[name] [id number] [birth year] 34 y.o.  Virtual Visit via Video Note  I connected with pt @ on 07/10/19 at  3:15 PM EST by a video enabled telemedicine application and verified that I am speaking with the correct person using two identifiers.   I discussed the limitations of evaluation and management by telemedicine and the availability of in person appointments. The patient expressed understanding and agreed to proceed.  I discussed the assessment and treatment plan with the patient. The patient was provided an opportunity to ask questions and all were answered. The patient agreed with the plan and demonstrated an understanding of the instructions.   The patient was advised to call back or seek an in-person evaluation if the symptoms worsen or if the condition fails to improve as anticipated.  I provided 25 minutes of non-face-to-face time during this encounter.  The patient was located at home.  The provider was located at [place].   Thayer Headings, PMHNP   Subjective:   Patient ID:  Mark Fritz is a 34 y.o. (DOB 1985/05/19) male.  Chief Complaint:  Chief Complaint  Patient presents with  . Follow-up    ADD    HPI Mark Fritz presents for follow-up of ADD. He reports that evekeo continues to be effective. He notices some distractibility with other factors. He reports that his mood has been ok and denies any significant depression. He reports that he has been able to manage changes in mood. He reports that he has been trying to get adequate sleep and exercise. He reports that anxiety has been manageable. Worry has been minimal. Usually sleeps about 6 hours a night. Appetite has been ok. Motivation has been adequate. Energy has been ok. Concentration has been adequate. Denies SI.   Past Psychiatric Medication Trials: Sertraline-adverse effects Adderall-decreased appetite, increased irritability Evekeo-effective for attention deficit with out significant  tolerability issues  Review of Systems:  Review of Systems  Cardiovascular: Negative for palpitations.  Musculoskeletal: Negative for gait problem.  Skin:       May have had shingles recently. Was treated with antibiotics and antivirals.   Neurological: Negative for tremors.  Psychiatric/Behavioral:       Please refer to HPI    Medications: I have reviewed the patient's current medications.  Current Outpatient Medications  Medication Sig Dispense Refill  . [START ON 09/22/2019] Amphetamine Sulfate (EVEKEO) 10 MG TABS Take 10 mg by mouth every morning AND 5 mg daily in the afternoon. 45 tablet 0  . cetirizine (ZYRTEC) 10 MG tablet Take 10 mg by mouth daily.    Marland Kitchen doxylamine, Sleep, (UNISOM) 25 MG tablet Take 25 mg by mouth at bedtime as needed.    . Famotidine (PEPCID PO) Take by mouth daily as needed.    . Melatonin 3 MG TABS Take by mouth at bedtime as needed.    Marland Kitchen amoxicillin-clavulanate (AUGMENTIN) 875-125 MG tablet Take 1 tablet by mouth 2 (two) times daily. (Patient not taking: Reported on 07/10/2019) 20 tablet 0  . [START ON 08/25/2019] Amphetamine Sulfate (EVEKEO) 10 MG TABS Take 10 mg by mouth every morning AND 5 mg daily after lunch. 45 tablet 0  . [START ON 07/28/2019] Amphetamine Sulfate (EVEKEO) 10 MG TABS Take 10 mg by mouth every morning AND 5 mg daily at 12 noon. 30 tablet 0   No current facility-administered medications for this visit.    Medication Side Effects: None  Allergies: No Known Allergies  Past Medical History:  Diagnosis Date  .  ADHD (attention deficit hyperactivity disorder)   . De Quervain's tenosynovitis, right 12/2017  . Dental crowns present    and 1 implant  . History of gastric ulcer   . 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 Depression Mother   . Diabetes Mother   . Miscarriages / [place] Mother   . Hearing loss Father   . Hypertension Father   . Mental illness Maternal  Grandmother   . Hypertension Paternal Grandfather   . Stroke Paternal Grandfather   . Depression Sister     Social History   Socioeconomic History  . Marital status: Married    Spouse name: Not on file  . Number of children: Not on file  . Years of education: Not on file  . Highest education level: Not on file  Occupational History  . Not on file  Tobacco Use  . Smoking status: Never Smoker  . Smokeless tobacco: Never Used  Substance and Sexual Activity  . Alcohol use: No  . Drug use: No  . Sexual activity: Yes  Other Topics Concern  . Not on file  Social History Narrative  . Not on file   Social Determinants of Health   Financial Resource Strain:   . Difficulty of Paying Living Expenses:   Food Insecurity:   . Worried About Programme researcher, broadcasting/film/video in the Last Year:   . Barista in the Last Year:   Transportation Needs:   . Freight forwarder (Medical):   Marland Kitchen Lack of Transportation (Non-Medical):   Physical Activity:   . Days of Exercise per Week:   . Minutes of Exercise per Session:   Stress:   . Feeling of Stress :   Social Connections:   . Frequency of Communication with Friends and Family:   . Frequency of Social Gatherings with Friends and Family:   . Attends Religious Services:   . Active Member of Clubs or Organizations:   . Attends Banker Meetings:   Marland Kitchen Marital Status:   Intimate Partner Violence:   . Fear of Current or Ex-Partner:   . Emotionally Abused:   Marland Kitchen Physically Abused:   . Sexually Abused:     Past Medical History, Surgical history, Social history, and Family history were reviewed and updated as appropriate.   Please see review of systems for further details on the patient's review from today.   Objective:   Physical Exam:  Pulse 69   Wt 197 lb (89.4 kg)   BMI 30.85 kg/m   Physical Exam Neurological:     Mental Status: He is alert and oriented to person, place, and time.     Cranial Nerves: No dysarthria.   Psychiatric:        Attention and Perception: Attention and perception normal.        Mood and Affect: Mood normal.        Speech: Speech normal.        Behavior: Behavior is cooperative.        Thought Content: Thought content normal. Thought content is not paranoid or delusional. Thought content does not include homicidal or suicidal ideation. Thought content does not include homicidal or suicidal plan.        Cognition and Memory: Cognition and memory normal.        Judgment: Judgment normal.     Comments: Insight intact     Lab  Review:     Component Value Date/Time   NA 138 07/10/2017 0859   K 4.2 07/10/2017 0859   CL 101 07/10/2017 0859   CO2 30 07/10/2017 0859   GLUCOSE 107 (H) 07/10/2017 0859   BUN 18 07/10/2017 0859   CREATININE 1.04 07/10/2017 0859   CALCIUM 10.0 07/10/2017 0859   PROT 7.5 07/10/2017 0859   ALBUMIN 4.8 07/10/2017 0859   AST 21 07/10/2017 0859   ALT 30 07/10/2017 0859   ALKPHOS 49 07/10/2017 0859   BILITOT 0.5 07/10/2017 0859       Component Value Date/Time   WBC 5.4 07/10/2017 0859   RBC 5.39 07/10/2017 0859   HGB 15.6 07/10/2017 0859   HCT 45.0 07/10/2017 0859   PLT 250.0 07/10/2017 0859   MCV 83.5 07/10/2017 0859   MCHC 34.7 07/10/2017 0859   RDW 13.0 07/10/2017 0859   LYMPHSABS 2.1 07/10/2017 0859   MONOABS 0.4 07/10/2017 0859   EOSABS 0.1 07/10/2017 0859   BASOSABS 0.0 07/10/2017 0859    No results found for: POCLITH, LITHIUM   No results found for: PHENYTOIN, PHENOBARB, VALPROATE, CBMZ   .res Assessment: Plan:   Will continue current plan of care since target signs and symptoms are well controlled without any tolerability issues. Continue Evekeo 10 mg po q am and 5 mg po q noon for ADD. Pt to f/u in 3 months or sooner if clinically indicated.  Patient advised to contact office with any questions, adverse effects, or acute worsening in signs and symptoms.  Azriel was seen today for follow-up.  Diagnoses and all orders for this  visit:  Attention deficit hyperactivity disorder (ADHD), combined type -     Amphetamine Sulfate (EVEKEO) 10 MG TABS; Take 10 mg by mouth every morning AND 5 mg daily in the afternoon. -     Amphetamine Sulfate (EVEKEO) 10 MG TABS; Take 10 mg by mouth every morning AND 5 mg daily after lunch. -     Amphetamine Sulfate (EVEKEO) 10 MG TABS; Take 10 mg by mouth every morning AND 5 mg daily at 12 noon.     Please see After Visit Summary for patient specific instructions.  No future appointments.  No orders of the defined types were placed in this encounter.     -------------------------------

## 2019-08-24 ENCOUNTER — Other Ambulatory Visit: Payer: Self-pay

## 2019-08-24 ENCOUNTER — Ambulatory Visit (INDEPENDENT_AMBULATORY_CARE_PROVIDER_SITE_OTHER): Payer: BC Managed Care – PPO | Admitting: Family Medicine

## 2019-08-24 ENCOUNTER — Ambulatory Visit: Payer: Self-pay

## 2019-08-24 ENCOUNTER — Encounter: Payer: Self-pay | Admitting: Family Medicine

## 2019-08-24 VITALS — BP 112/72 | HR 97 | Ht 67.0 in | Wt 208.6 lb

## 2019-08-24 DIAGNOSIS — G8929 Other chronic pain: Secondary | ICD-10-CM | POA: Diagnosis not present

## 2019-08-24 DIAGNOSIS — M79672 Pain in left foot: Secondary | ICD-10-CM

## 2019-08-24 MED ORDER — NITROGLYCERIN 0.2 MG/HR TD PT24: MEDICATED_PATCH | TRANSDERMAL | 1 refills | Status: DC

## 2019-08-24 NOTE — Patient Instructions (Addendum)
Please perform the exercise program that we have prepared for you and gone over in detail on a daily basis.  In addition to the handout you were provided you can access your program through: [email or web address]   Your unique program code is: [email or web address]  Do the exercises.  Recheck in 6 weeks.  Let me know if you have an issue.  Also ok to use the felt heel lifts from [email or web address]  Nitroglycerin Protocol   Apply 1/4 nitroglycerin patch to affected area daily.  Change position of patch within the affected area every 24 hours.  You may experience a headache during the first 1-2 weeks of using the patch, these should subside.  If you experience headaches after beginning nitroglycerin patch treatment, you may take your preferred over the counter pain reliever.  Another side effect of the nitroglycerin patch is skin irritation or rash related to patch adhesive.  Please notify our office if you develop more severe headaches or rash, and stop the patch.  Tendon healing with nitroglycerin patch may require 12 to 24 weeks depending on the extent of injury.  Men should not use if taking Viagra, Cialis, or Levitra.   Do not use if you have migraines or rosacea.    Achilles Tendinitis  Achilles tendinitis is inflammation of the tough, cord-like band that attaches the lower leg muscles to the heel bone (Achilles tendon). This is usually caused by overusing the tendon and the ankle joint. Achilles tendinitis usually gets better over time with treatment and caring for yourself at home. It can take weeks or months to heal completely. What are the causes? This condition may be caused by:  A sudden increase in exercise or activity, such as running.  Doing the same exercises or activities, such as jumping, over and over.  Not warming up calf muscles before exercising.  Exercising in shoes that are worn out or not made for exercise.  Having arthritis or a bone growth (spur) on the back of the  heel bone. This can rub against the tendon and hurt it.  Age-related wear and tear. Tendons become less flexible with age and are more likely to be injured. What are the signs or symptoms? Common symptoms of this condition include:  Pain in the Achilles tendon or in the back of the leg, just above the heel. The pain usually gets worse with exercise.  Stiffness or soreness in the back of the leg, especially in the morning.  Swelling of the skin over the Achilles tendon.  Thickening of the tendon.  Trouble standing on tiptoe. How is this diagnosed? This condition is diagnosed based on your symptoms and a physical exam. You may have tests, including:  X-rays.  MRI. How is this treated? The goal of treatment is to relieve symptoms and help your injury heal. Treatment may include:  Decreasing or stopping activities that caused the tendinitis. This may mean switching to low-impact exercises like biking or swimming.  Icing the injured area.  Doing physical therapy, including strengthening and stretching exercises.  Taking NSAIDs, such as ibuprofen, to help relieve pain and swelling.  Using supportive shoes, wraps, heel lifts, or a walking boot (air cast).  Having surgery. This may be done if your symptoms do not improve after other treatments.  Using high-energy shock wave impulses to stimulate the healing process (extracorporeal shock wave therapy). This is rare.  Having an injection of medicines that help relieve inflammation (corticosteroids). This is rare. Follow these instructions at home: If you have  an air cast:  Wear the air cast as told by your health care provider. Remove it only as told by your health care provider.  Loosen it if your toes tingle, become numb, or turn cold and blue.  Keep it clean.  If the air cast is not waterproof: ? Do not let it get wet. ? Cover it with a watertight covering when you take a bath or shower. Managing pain, stiffness, and  swelling   If directed, put ice on the injured area. To do this: ? If you have a removable air cast, remove it as told by your health care provider. ? Put ice in a plastic bag. ? Place a towel between your skin and the bag. ? Leave the ice on for 20 minutes, 2-3 times a day.  Move your toes often to reduce stiffness and swelling.  Raise (elevate) your foot above the level of your heart while you are sitting or lying down. Activity  Gradually return to your normal activities as told by your health care provider. Ask your health care provider what activities are safe for you.  Do not do activities that cause pain.  Consider doing low-impact exercises, like cycling or swimming.  Ask your health care provider when it is safe to drive if you have an air cast on your foot.  If physical therapy was prescribed, do exercises as told by your health care provider or physical therapist. General instructions  If directed, wrap your foot with an elastic bandage or other wrap. This can help to keep your tendon from moving too much while it heals. Your health care provider will show you how to wrap your foot correctly.  Wear supportive shoes or heel lifts only as told by your health care provider.  Take over-the-counter and prescription medicines only as told by your health care provider.  Keep all follow-up visits as told by your health care provider. This is important. Contact a health care provider if you:  Have symptoms that get worse.  Have pain that does not get better with medicine.  Develop new, unexplained symptoms.  Develop warmth and swelling in your foot.  Have a fever. Get help right away if you:  Have a sudden popping sound or sensation in your Achilles tendon followed by severe pain.  Cannot move your toes or foot.  Cannot put any weight on your foot.  Your foot or toes become numb and look white or blue even after loosening your bandage or air  cast. Summary  Achilles tendinitis is inflammation of the tough, cord-like band that attaches the lower leg muscles to the heel bone (Achilles tendon).  This condition is usually caused by overusing the tendon and the ankle joint. It can also be caused by arthritis or normal aging.  The most common symptoms of this condition include pain, swelling, or stiffness in the Achilles tendon or in the back of the leg.  This condition is usually treated by decreasing or stopping activities that caused the tendinitis, icing the injured area, taking NSAIDs, and doing physical therapy. This information is not intended to replace advice given to you by your health care provider. Make sure you discuss any questions you have with your health care provider. Document Revised: 09/01/2018 Document Reviewed: 09/01/2018 Elsevier Patient Education  2020 [employer].

## 2019-08-24 NOTE — Progress Notes (Signed)
    Subjective:    CC: L heel and L ankle  I, Molly Weber, LAT, ATC, am serving as scribe for Dr. Clementeen Graham.  HPI: Pt is a 34 y/o male presenting w/ c/o L heel and L ankle pain x approximately 6 months.  He began to notice burning pain in his L heel w/ running and now the pain is moving into his L Achille's.  He rates his pain as moderate and rest and no pain at rest and describes his pain as burning.  Radiating pain: Yes into L Achille's  Swelling: Yes Aggravating factors: Running; prolonged walking; elliptical; ankle DF Treatments tried: shoe inserts/insoles; Achille's band; stretching;   Pertinent review of Systems: No fevers or chills  Relevant historical information: Hx left foot bunionectomy   Objective:    Vitals:   08/24/19 0829  BP: 112/72  Pulse: 97  SpO2: 96%   General: Well Developed, well nourished, and in no acute distress.   MSK: Left foot under normal-appearing Not particularly tender palpation at distal Achilles tendon and posterior calcaneus. Nontender into the foot and plantar calcaneus. Normal foot and ankle motion. Pes planus with flatfoot standing. Appropriate heel valgus with toe standing. Pulses capillary fill and sensation are intact distally. Leg lengths are equal.  Lab and Radiology Results  Diagnostic Limited MSK Ultrasound of: Left Achilles tendon Distal AT slightly thicker than at insertion site without obvious nodule formation. No tendon disruption visible. Insertion site largely normal without significant abnormality.  No significant increased Doppler activity. Small retrocalcaneal bursa present. Impression: Mild Achilles tendinopathy and retrocalcaneal bursitis    Impression and Recommendations:    Assessment and Plan: 34 y.o. male with left posterior calcaneus pain ongoing for about 6 months.  Symptoms are consistent with insertional Achilles tendinitis.  Plan for heel lifts as needed, eccentric exercises and nitroglycerin  patch protocol.  Recheck in 6 weeks.  Return sooner if needed.  Precautions reviewed.  Home exercise teaching performed today.Marland Kitchen  PDMP not reviewed this encounter. Orders Placed This Encounter  Procedures  . [place] LIMITED JOINT SPACE STRUCTURES LOW LEFT(NO LINKED CHARGES)    Order Specific Question:   Reason for Exam (SYMPTOM  OR DIAGNOSIS REQUIRED)    Answer:   L heel pain    Order Specific Question:   Preferred imaging location?    Answer:   [facility] ordered this encounter  Medications  . nitroGLYCERIN (NITRODUR - DOSED IN MG/24 HR) 0.2 mg/hr patch    Sig: Apply 1/4 patch daily to tendon for tendonitis.    Dispense:  30 patch    Refill:  1    Discussed warning signs or symptoms. Please see discharge instructions. Patient expresses understanding.   The above documentation has been reviewed and is accurate and complete Clementeen Graham

## 2019-08-26 ENCOUNTER — Other Ambulatory Visit: Payer: Self-pay | Admitting: Psychiatry

## 2019-08-26 DIAGNOSIS — F902 Attention-deficit hyperactivity disorder, combined type: Secondary | ICD-10-CM

## 2019-08-26 MED ORDER — AMPHETAMINE SULFATE 10 MG PO TABS: ORAL_TABLET | ORAL | 0 refills | Status: DC

## 2019-08-26 NOTE — Telephone Encounter (Signed)
Mark Fritz went to pick up his [name] at [address] and they don't have it.  They will have to order it and it will be a week before they get it in.  [facility] [address] and [facility] to the [facility] at [address] apparently they have it.  Just do the prescription for today not the refill for 09/22/19

## 2019-08-31 ENCOUNTER — Encounter: Payer: Self-pay | Admitting: Physician Assistant

## 2019-09-03 ENCOUNTER — Other Ambulatory Visit: Payer: Self-pay

## 2019-09-03 ENCOUNTER — Ambulatory Visit (INDEPENDENT_AMBULATORY_CARE_PROVIDER_SITE_OTHER): Payer: BC Managed Care – PPO | Admitting: Podiatry

## 2019-09-03 ENCOUNTER — Other Ambulatory Visit: Payer: Self-pay | Admitting: Podiatry

## 2019-09-03 ENCOUNTER — Ambulatory Visit (INDEPENDENT_AMBULATORY_CARE_PROVIDER_SITE_OTHER): Payer: BC Managed Care – PPO

## 2019-09-03 ENCOUNTER — Encounter: Payer: Self-pay | Admitting: Podiatry

## 2019-09-03 VITALS — BP 139/97 | HR 77 | Resp 16

## 2019-09-03 DIAGNOSIS — M7662 Achilles tendinitis, left leg: Secondary | ICD-10-CM

## 2019-09-03 DIAGNOSIS — M722 Plantar fascial fibromatosis: Secondary | ICD-10-CM

## 2019-09-03 MED ORDER — MELOXICAM 15 MG PO TABS: 15.0000 mg | ORAL_TABLET | Freq: Every day | ORAL | 3 refills | Status: DC

## 2019-09-03 MED ORDER — METHYLPREDNISOLONE 4 MG PO TBPK: ORAL_TABLET | ORAL | 0 refills | Status: DC

## 2019-09-03 NOTE — Progress Notes (Signed)
Subjective:  Patient ID: Mark Fritz, male    DOB: July 08, 1985,  MRN: [id number] HPI Chief Complaint  Patient presents with  . Foot Pain    Posterior heel left - aching x 6 months intermittent, now constant, tried "zero drop" shoes and thinks made feet worse, but did help shin splints  . New Patient (Initial Visit)    34 y.o. male presents with the above complaint.   ROS: Denies fever chills nausea vomiting muscle aches pains calf pain back pain chest pain shortness of breath.  Past Medical History:  Diagnosis Date  . ADHD (attention deficit hyperactivity disorder)   . De Quervain's tenosynovitis, right 12/2017  . Dental crowns present    and 1 implant  . History of gastric ulcer   . 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 BLEPHAROPLASTY Right   . BUNIONECTOMY Left 2008  . DORSAL COMPARTMENT RELEASE Right 01/14/2018   Procedure: RIGHT WRIST RELEASE DORSAL COMPARTMENT (DEQUERVAIN);  Surgeon: Daryll Brod, MD;  Location: [place];  Service: Orthopedics;  Laterality: Right;    Current Outpatient Medications:  .  [START ON 09/22/2019] Amphetamine Sulfate (EVEKEO) 10 MG TABS, Take 10 mg by mouth every morning AND 5 mg daily in the afternoon., Disp: 45 tablet, Rfl: 0 .  Amphetamine Sulfate (EVEKEO) 10 MG TABS, Take 10 mg by mouth every morning AND 5 mg daily at 12 noon., Disp: 30 tablet, Rfl: 0 .  Amphetamine Sulfate (EVEKEO) 10 MG TABS, Take 10 mg by mouth every morning AND 5 mg daily after lunch., Disp: 45 tablet, Rfl: 0 .  cetirizine (ZYRTEC) 10 MG tablet, Take 10 mg by mouth daily., Disp: , Rfl:  .  doxylamine, Sleep, (UNISOM) 25 MG tablet, Take 25 mg by mouth at bedtime as needed., Disp: , Rfl:  .  Famotidine (PEPCID PO), Take by mouth daily as needed., Disp: , Rfl:  .  Melatonin 3 MG TABS, Take by mouth at bedtime as needed., Disp: , Rfl:  .  meloxicam (MOBIC) 15 MG tablet, Take 1 tablet (15 mg total)  by mouth daily., Disp: 30 tablet, Rfl: 3 .  methylPREDNISolone (MEDROL DOSEPAK) 4 MG TBPK tablet, 6 day dose pack - take as directed, Disp: 21 tablet, Rfl: 0 .  nitroGLYCERIN (NITRODUR - DOSED IN MG/24 HR) 0.2 mg/hr patch, Apply 1/4 patch daily to tendon for tendonitis., Disp: 30 patch, Rfl: 1  No Known Allergies Review of Systems Objective:   Vitals:   09/03/19 0839  BP: (!) 139/97  Pulse: 77  Resp: 16    General: Well developed, nourished, in no acute distress, alert and oriented x3   Dermatological: Skin is warm, dry and supple bilateral. Nails x 10 are well maintained; remaining integument appears unremarkable at this time. There are no open sores, no preulcerative lesions, no rash or signs of infection present.  Vascular: Dorsalis Pedis artery and Posterior Tibial artery pedal pulses are 2/4 bilateral with immedate capillary fill time. Pedal hair growth present. No varicosities and no lower extremity edema present bilateral.   Neruologic: Grossly intact via light touch bilateral. Vibratory intact via tuning fork bilateral. Protective threshold with Semmes Wienstein monofilament intact to all pedal sites bilateral. Patellar and Achilles deep tendon reflexes 2+ bilateral. No Babinski or clonus noted bilateral.   Musculoskeletal: No gross boney pedal deformities bilateral. No pain, crepitus, or limitation noted with foot and ankle range of motion bilateral. Muscular  strength 5/5 in all groups tested bilateral.  He has gastroc soleus equinus.  This is resulting in tenderness at the insertion of the Achilles just posterior to the superior aspect of the calcaneus.  There appears to be some fluctuance on palpation of the left Achilles area.  With the knee bent he has much less pain.  Gait: Unassisted, Nonantalgic.    Radiographs:  Radiographs do not demonstrate any type of soft tissue or osseous abnormalities of the rear foot.  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  Plan: Discussed etiology pathology conservative surgical therapies at this point I went ahead and started him on methylprednisolone to be followed by meloxicam.  I instructed him to wear his heel lifts for only a short period of time while going through this time to decrease inflammation.  We need to get him in with [facility] to have a new set of orthotics built.  We did not inject him at this time but we may next time as well send him to physical therapy for stretching.  We did instruct him on fit on stretching and he will do those as well.     Dragon Thrush T. [place], [place]

## 2019-09-13 IMAGING — MR MR WRIST*R* W/ CM
5 series · 39 of 40 positions shown · IV contrast (agent unspecified)
Comparison: Plain films right wrist performed at the [REDACTED] 07/10/2017. Image from contrast injection also reviewed.

CLINICAL DATA: Right wrist pain for 1 year since the patient felt a
pop in the wrist while weight lifting.

EXAM:
MRI OF THE RIGHT WRIST WITH CONTRAST (MR Arthrogram)
TECHNIQUE: Multiplanar, multisequence MR imaging of the wrist was performed
immediately following contrast injection into the radiocarpal joint
under fluoroscopic guidance. No intravenous contrast was
administered.

[Series 6: T2 fat-sat · axial · right · 3.0mm · 0.28mm/px · z∈[-83,-15]mm · 12 of 25 slices shown (1 of 2)]
[im 1/25]
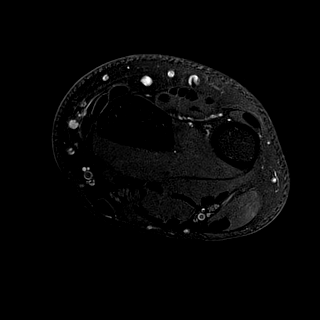
[im 3/25]
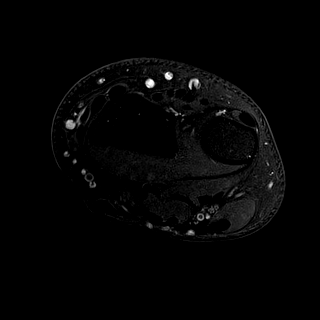
[im 5/25]
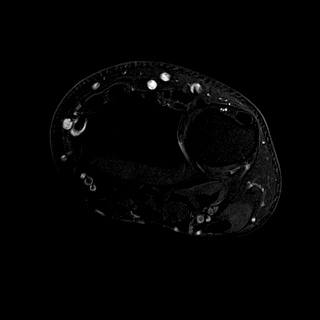
[im 7/25]
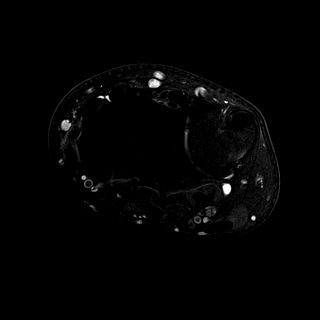
[im 9/25]
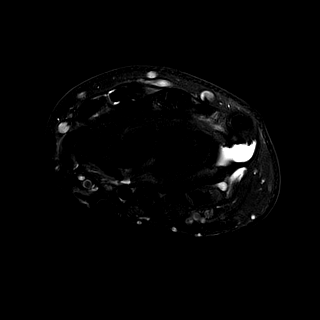
[im 11/25]
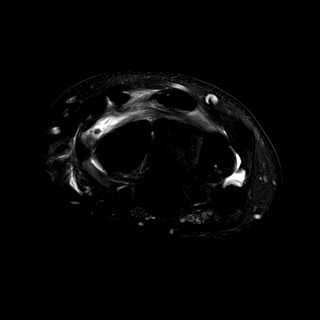
[im 14/25]
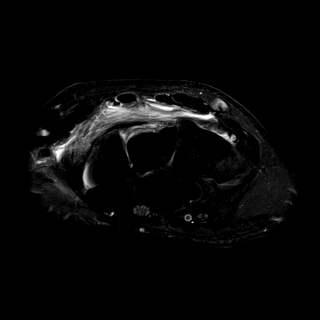
[im 16/25]
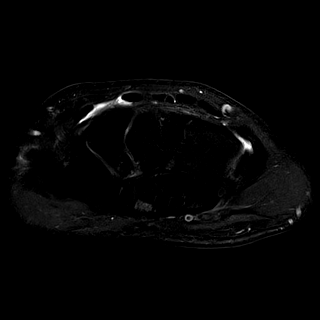
[im 18/25]
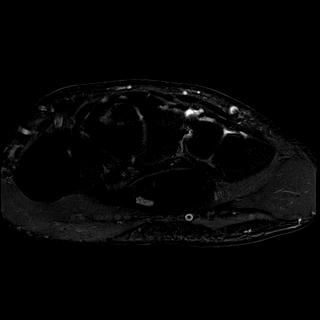
[im 20/25]
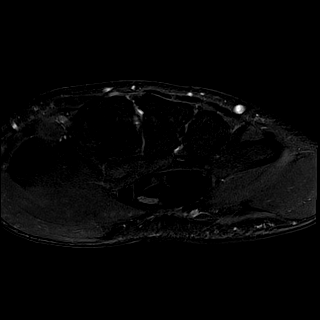
[im 22/25]
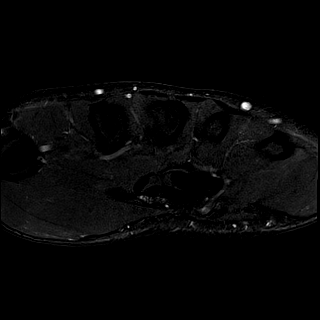
[im 25/25]
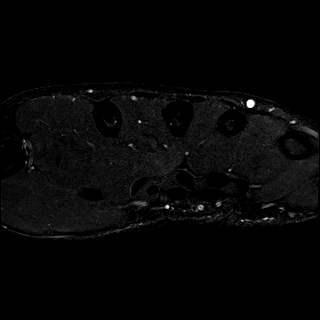

[Series 7: T1 fat-sat · coronal · right · 3.0mm · 0.23mm/px · 6 of 11 slices shown (1 of 2)]
[im 1/11]
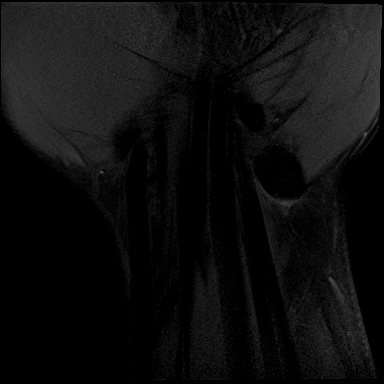
[im 3/11]
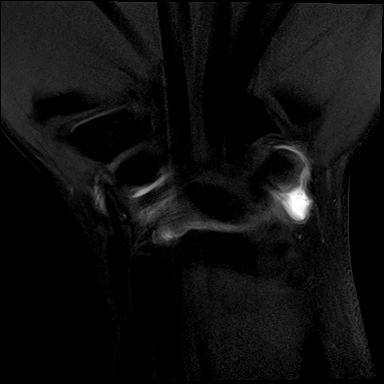
[im 5/11]
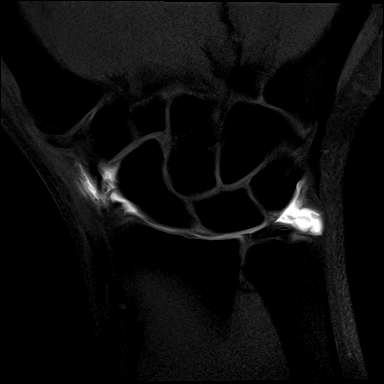
[im 7/11]
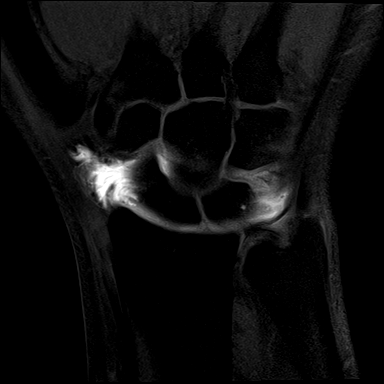
[im 9/11]
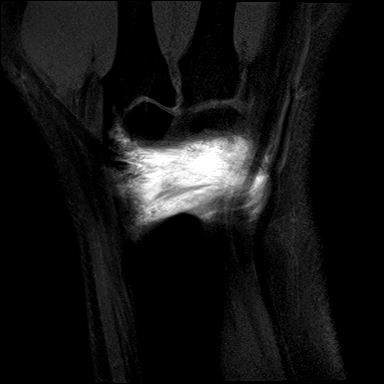
[im 11/11]
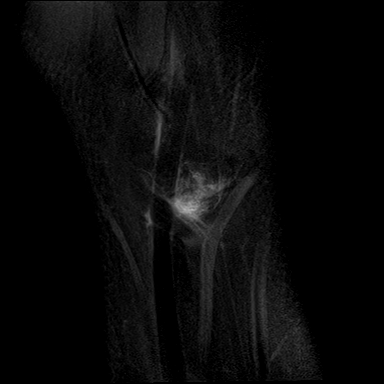

[Series 8: T1 · coronal · right · 3.0mm · 0.23mm/px · 6 of 11 slices shown]
[im 1/11]
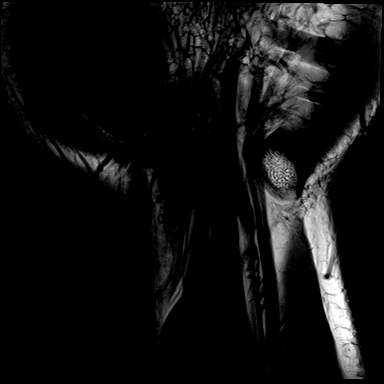
[im 3/11]
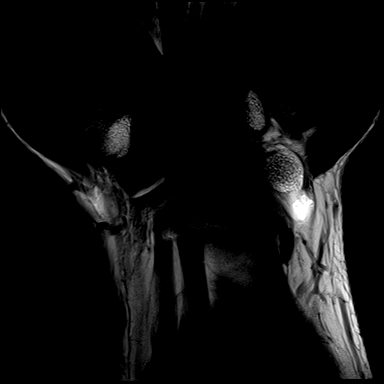
[im 5/11]
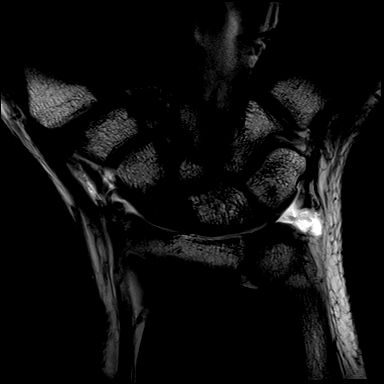
[im 7/11]
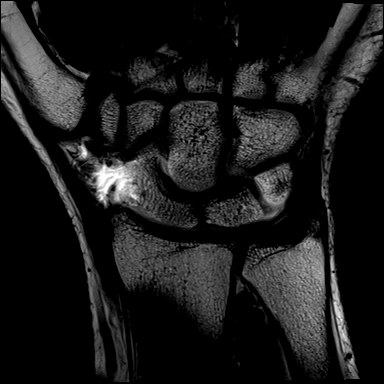
[im 9/11]
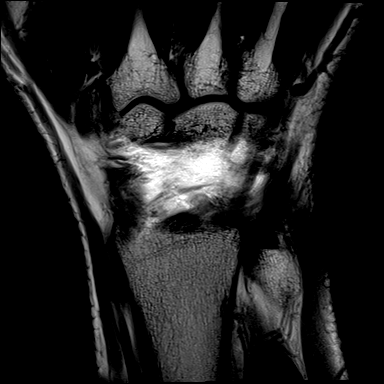
[im 11/11]
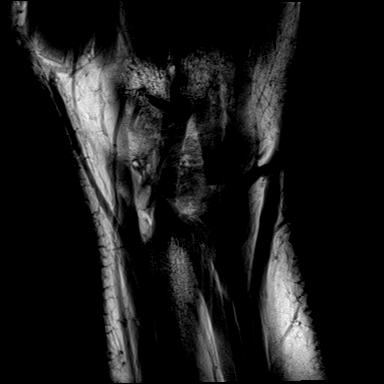

[Series 9: T2 fat-sat · coronal · right · 3.0mm · 0.28mm/px · 6 of 11 slices shown (2 of 2)]
[im 1/11]
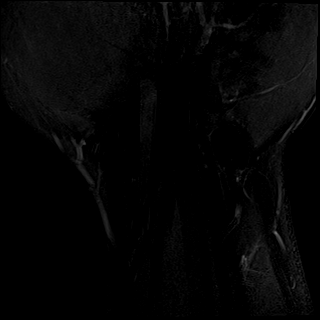
[im 3/11]
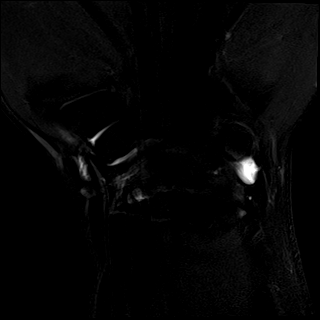
[im 5/11]
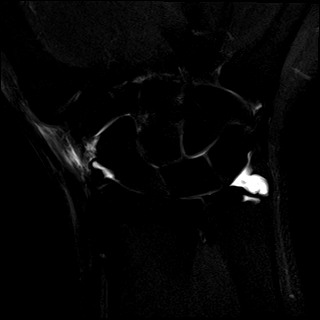
[im 7/11]
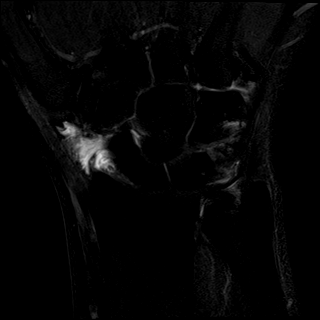
[im 9/11]
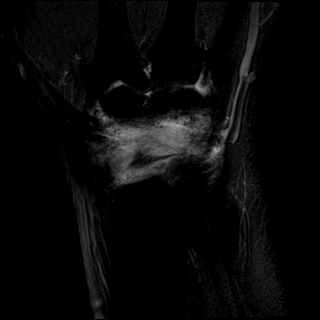
[im 11/11]
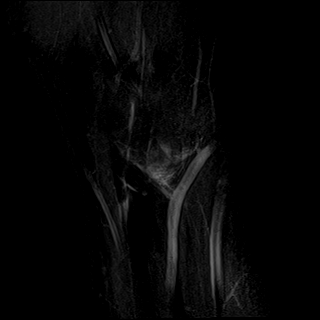

[Series 10: T1 fat-sat · sagittal · right · 3.0mm · 0.23mm/px · 9 of 19 slices shown (2 of 2)]
[im 1/19]
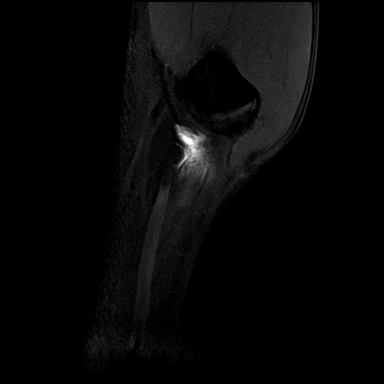
[im 3/19]
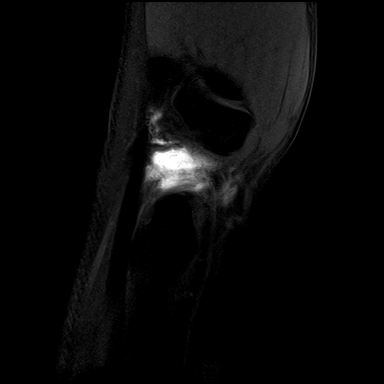
[im 5/19]
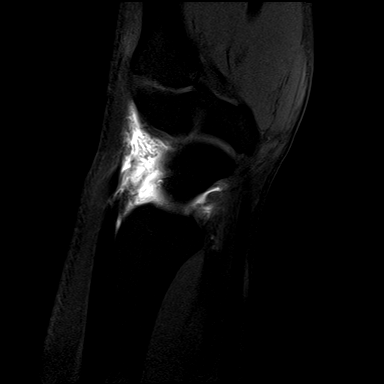
[im 7/19]
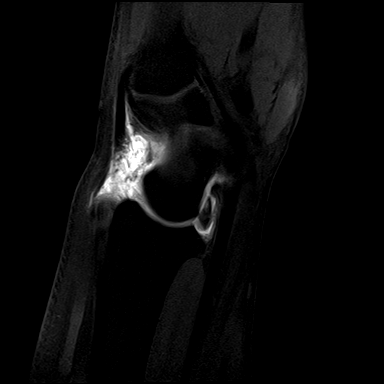
[im 9/19]
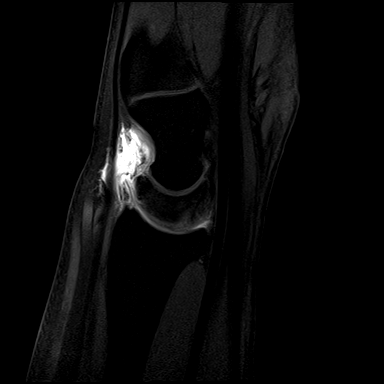
[im 11/19]
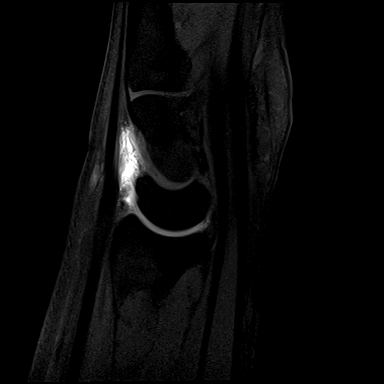
[im 13/19]
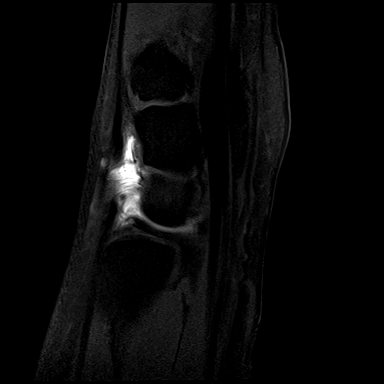
[im 17/19]
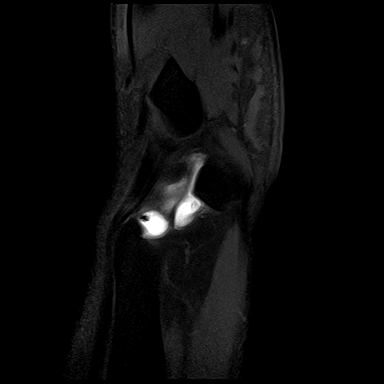
[im 19/19]
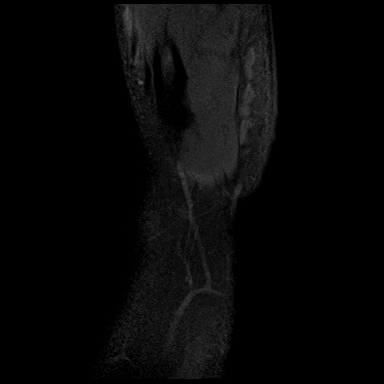

[39 of 40 positions shown; findings below may reference images not displayed]

FINDINGS: Ligaments: The scapholunate and lunotriquetral ligaments are intact.

Triangular fibrocartilage: Disc of the triangular fibrocartilage
appears mildly degenerated but no tear is seen. No contrast
extravasates into the distal radioulnar joint.

Tendons: Appear normal.

Carpal tunnel/median nerve: Normal.

Guyon's canal: Normal.

Joint/cartilage: Cartilage surfaces are preserved. No erosion,
osteophytosis, subchondral cyst formation or edema is identified.

Bones/carpal alignment: Normal signal throughout. Carpal alignment
is normal.

Other: No fluid collection or mass is identified.
IMPRESSION: Negative for ligament or TFC tear. The disc of the triangular
fibrocartilage appears mildly degenerated. The exam is otherwise
negative.

## 2019-09-13 IMAGING — XA DG ARTHROGRAM WRIST*R*
1 series · 1 of 1 positions shown · non-contrast
Comparison: none

CLINICAL DATA: Right wrist pain.

[Series 1: ortho standard · 1 of 1 slices shown]
[im 1/1]
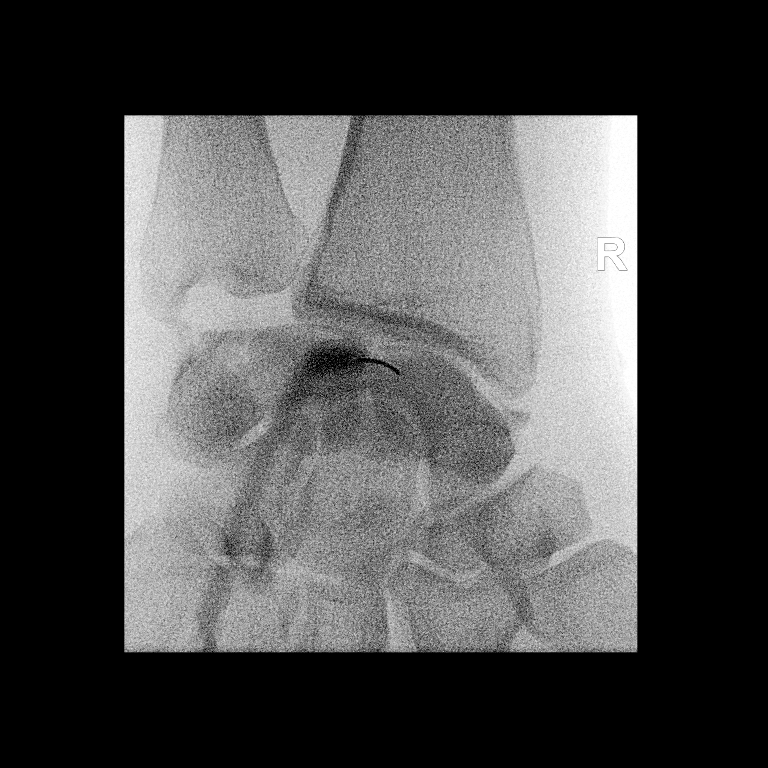

[1 of 1 positions shown; findings below may reference images not displayed]

FLUOROSCOPY TIME:  Radiation Exposure Index (as provided by the
fluoroscopic device): 0.24 Gy*m2

Fluoroscopy Time:  1 second

Number of Acquired Images:  0

PROCEDURE:
The risks and benefits of the procedure were discussed with the
patient, and written informed consent was obtained. The patient
stated no history of allergy to contrast media. A formal timeout
procedure was performed with the patient according to departmental
protocol.

The patient was placed supine on the fluoroscopy table and the right
wrist joint was identified under fluoroscopy. The skin overlying the
right wrist joint was subsequently cleaned with Betadine and a
sterile drape was placed over the area of interest. 1 ml 1%
Lidocaine was used to anesthetize the skin around the needle
insertion site.

A 25 gauge needle was inserted into the right wrist joint under
fluoroscopy.

2 ml of gadolinium mixture (0.1 ml of Multihance mixed with 5 ml of
Isovue-M 200 contrast and 15 ml of sterile saline) was injected into
the right wrist joint.

The needle was removed and hemostasis was achieved. The patient was
subsequently transferred to MRI for imaging.
IMPRESSION: Technically successful right wrist injection for MRI.

## 2019-10-05 ENCOUNTER — Ambulatory Visit: Payer: BC Managed Care – PPO | Admitting: Family Medicine

## 2019-10-06 ENCOUNTER — Ambulatory Visit (INDEPENDENT_AMBULATORY_CARE_PROVIDER_SITE_OTHER): Payer: BC Managed Care – PPO | Admitting: Podiatry

## 2019-10-06 ENCOUNTER — Other Ambulatory Visit: Payer: Self-pay

## 2019-10-06 DIAGNOSIS — M7662 Achilles tendinitis, left leg: Secondary | ICD-10-CM | POA: Diagnosis not present

## 2019-10-06 NOTE — Progress Notes (Signed)
He presents today for follow-up of his Achilles tendinitis left.  He states that it got better for a while wearing the boot most of the time but cannot wear the boot all the time so it will start to feel more sore again.  He states that he still exercising in the mornings is really not improved significantly.  Objective: Vital signs are stable he is alert and oriented x3.  Pulses are palpable.  No erythema edema cellulitis drainage odor there is an area of fluctuance in the posterior aspect of the calcaneus about midline of the calcaneus posteriorly.  There is tenderness and fluctuance in this area.  He has good dorsiflexion which is more motion than he had the last time I saw him.  Assessment: Insertional Achilles tendinitis posterior left.  Plan: Discussed orthotics easily see Mark Fritz today to get new orthotics made he also received an injection of 2 mg of dexamethasone subcutaneously into the bursa the retro-Achilles bursa.  He tolerated the procedure well without complications.  He is to continue stretching continue wearing the cam walker he is going on a 3-week trip to [place] and will come back to pick up his orthotics once he returns I will see him at that point.

## 2019-10-08 ENCOUNTER — Telehealth: Payer: Self-pay | Admitting: Psychiatry

## 2019-10-08 NOTE — Telephone Encounter (Signed)
Pt is asking for his Evekeo RF. He is leaving town on 6/16 to go to [facility] for three weeks though and will need to get his prescription by then? How can he get it filled early? [facility]  [address]

## 2019-10-09 ENCOUNTER — Other Ambulatory Visit: Payer: Self-pay

## 2019-10-09 DIAGNOSIS — F902 Attention-deficit hyperactivity disorder, combined type: Secondary | ICD-10-CM

## 2019-10-09 MED ORDER — AMPHETAMINE SULFATE 10 MG PO TABS: 10.0000 mg | ORAL_TABLET | Freq: Two times a day (BID) | ORAL | 0 refills | Status: DC

## 2019-10-09 NOTE — Telephone Encounter (Signed)
Pharmacist made note of early refill but his insurance probably will not pay.

## 2019-10-09 NOTE — Telephone Encounter (Signed)
Patient is aware Rx sent, he did receive a text that said too soon to fill. Reassured him that was probably automatically generated due to it being early. He said as long as he can get it by Tuesday.  He's very appreciative of the fast attention, he apologized from waiting to ask.

## 2019-11-10 ENCOUNTER — Other Ambulatory Visit: Payer: Self-pay

## 2019-11-10 ENCOUNTER — Encounter: Payer: Self-pay | Admitting: Psychiatry

## 2019-11-10 ENCOUNTER — Ambulatory Visit (INDEPENDENT_AMBULATORY_CARE_PROVIDER_SITE_OTHER): Payer: BC Managed Care – PPO | Admitting: Psychiatry

## 2019-11-10 VITALS — BP 132/90 | HR 95 | Wt 205.0 lb

## 2019-11-10 DIAGNOSIS — F3342 Major depressive disorder, recurrent, in full remission: Secondary | ICD-10-CM | POA: Diagnosis not present

## 2019-11-10 DIAGNOSIS — F902 Attention-deficit hyperactivity disorder, combined type: Secondary | ICD-10-CM

## 2019-11-10 MED ORDER — AMPHETAMINE SULFATE 10 MG PO TABS: ORAL_TABLET | ORAL | 0 refills | Status: DC

## 2019-11-10 NOTE — Progress Notes (Signed)
NANCY MANUELE [id number] March 07, 1986 34 y.o.  Subjective:   Patient ID:  COLAN LAYMON is a 34 y.o. (DOB 1985/08/21) male.  Chief Complaint:  Chief Complaint  Patient presents with  . Follow-up    ADD    HPI Ashley Montminy Karrer presents to the office today for follow-up of ADD. He recently returned from 3 week road trip to [place] to visit family and back. Returned Friday. He reports that he and his family enjoyed trip. Did not take medication during his vacation and re-started Evekeo when rerturning from trip. He reports that he noticed some irritability after longer periods of driving on trip. He reports that his mood has been "ok." Denies depressed mood. Anxiety has been ok. Sleep cycle has been disrupted some with recent road trip. Sleep was normal last night. He reports that he typically is unable to sleep more than 6 hours a nice. Appetite has been ok. Energy and motivation have been ok. Concentration has been adequate. Reports that he has been getting caught up at work after vacation. Denies SI.   He reports that response to medication seems to vary, ie. Some days will be more talkative after medication and other days does not notice this as much. Response does not correlate with caffeine use. Response may correlate with time of day and noticed he was more talkative when he took Evekeo later than usual.  Past Psychiatric Medication Trials: Sertraline-adverse effects Adderall-decreased appetite, increased irritability Evekeo-effective for attention deficit with out significant tolerability issues   PHQ2-9     Office Visit from 02/07/2017 in  [facility]  PHQ-2 Total Score 4  PHQ-9 Total Score 15       Review of Systems:  Review of Systems  Cardiovascular: Negative for palpitations.  Musculoskeletal: Negative for gait problem.       Improved achilles pain after road trip  Neurological: Negative for tremors.  Psychiatric/Behavioral:       Please refer to  HPI    Medications: I have reviewed the patient's current medications.  Current Outpatient Medications  Medication Sig Dispense Refill  . doxylamine, Sleep, (UNISOM) 25 MG tablet Take 12.5 mg by mouth at bedtime as needed.     . Famotidine (PEPCID PO) Take by mouth daily as needed.    . Melatonin 3 MG TABS Take by mouth at bedtime as needed.    . meloxicam (MOBIC) 15 MG tablet Take 1 tablet (15 mg total) by mouth daily. 30 tablet 3  . [START ON 01/15/2020] Amphetamine Sulfate (EVEKEO) 10 MG TABS Take 10 mg by mouth every morning AND 5 mg daily at 12 noon. 30 tablet 0  . [START ON 12/18/2019] Amphetamine Sulfate (EVEKEO) 10 MG TABS Take 10 mg by mouth every morning AND 5 mg daily after lunch. 45 tablet 0  . [START ON 11/20/2019] Amphetamine Sulfate (EVEKEO) 10 MG TABS Take 10 mg by mouth every morning AND 5 mg daily at 12 noon. 45 tablet 0  . nitroGLYCERIN (NITRODUR - DOSED IN MG/24 HR) 0.2 mg/hr patch Apply 1/4 patch daily to tendon for tendonitis. 30 patch 1   No current facility-administered medications for this visit.    Medication Side Effects: None  Allergies: No Known Allergies  Past Medical History:  Diagnosis Date  . ADHD (attention deficit hyperactivity disorder)   . De Quervain's tenosynovitis, right 12/2017  . Dental crowns present    and 1 implant  . History of gastric ulcer   . Prosthetic eye globe  right - birth defect    Family History  Problem Relation Age of Onset  . Depression Mother   . Diabetes Mother   . Miscarriages / [place] Mother   . Hearing loss Father   . Hypertension Father   . Mental illness Maternal Grandmother   . Hypertension Paternal Grandfather   . Stroke Paternal Grandfather   . Depression Sister     Social History   Socioeconomic History  . Marital status: Married    Spouse name: Not on file  . Number of children: Not on file  . Years of education: Not on file  . Highest education level: Not on file  Occupational History   . Not on file  Tobacco Use  . Smoking status: Never Smoker  . Smokeless tobacco: Never Used  Vaping Use  . Vaping Use: Never used  Substance and Sexual Activity  . Alcohol use: No  . Drug use: No  . Sexual activity: Yes  Other Topics Concern  . Not on file  Social History Narrative  . Not on file   Social Determinants of Health   Financial Resource Strain:   . Difficulty of Paying Living Expenses:   Food Insecurity:   . Worried About Programme researcher, broadcasting/film/video in the Last Year:   . Barista in the Last Year:   Transportation Needs:   . Freight forwarder (Medical):   Marland Kitchen Lack of Transportation (Non-Medical):   Physical Activity:   . Days of Exercise per Week:   . Minutes of Exercise per Session:   Stress:   . Feeling of Stress :   Social Connections:   . Frequency of Communication with Friends and Family:   . Frequency of Social Gatherings with Friends and Family:   . Attends Religious Services:   . Active Member of Clubs or Organizations:   . Attends Banker Meetings:   Marland Kitchen Marital Status:   Intimate Partner Violence:   . Fear of Current or Ex-Partner:   . Emotionally Abused:   Marland Kitchen Physically Abused:   . Sexually Abused:     Past Medical History, Surgical history, Social history, and Family history were reviewed and updated as appropriate.   Please see review of systems for further details on the patient's review from today.   Objective:   Physical Exam:  BP 132/90   Pulse 95   Wt 205 lb (93 kg)   BMI 32.11 kg/m   Physical Exam Constitutional:      General: He is not in acute distress. Musculoskeletal:        General: No deformity.  Neurological:     Mental Status: He is alert and oriented to person, place, and time.     Coordination: Coordination normal.  Psychiatric:        Attention and Perception: Attention and perception normal. He does not perceive auditory or visual hallucinations.        Mood and Affect: Mood normal. Mood is not  anxious or depressed. Affect is not labile, blunt, angry or inappropriate.        Speech: Speech normal.        Behavior: Behavior normal.        Thought Content: Thought content normal. Thought content is not paranoid or delusional. Thought content does not include homicidal or suicidal ideation. Thought content does not include homicidal or suicidal plan.        Cognition and Memory: Cognition and memory normal.  Judgment: Judgment normal.     Comments: Insight intact     Lab Review:     Component Value Date/Time   NA 138 07/10/2017 0859   K 4.2 07/10/2017 0859   CL 101 07/10/2017 0859   CO2 30 07/10/2017 0859   GLUCOSE 107 (H) 07/10/2017 0859   BUN 18 07/10/2017 0859   CREATININE 1.04 07/10/2017 0859   CALCIUM 10.0 07/10/2017 0859   PROT 7.5 07/10/2017 0859   ALBUMIN 4.8 07/10/2017 0859   AST 21 07/10/2017 0859   ALT 30 07/10/2017 0859   ALKPHOS 49 07/10/2017 0859   BILITOT 0.5 07/10/2017 0859       Component Value Date/Time   WBC 5.4 07/10/2017 0859   RBC 5.39 07/10/2017 0859   HGB 15.6 07/10/2017 0859   HCT 45.0 07/10/2017 0859   PLT 250.0 07/10/2017 0859   MCV 83.5 07/10/2017 0859   MCHC 34.7 07/10/2017 0859   RDW 13.0 07/10/2017 0859   LYMPHSABS 2.1 07/10/2017 0859   MONOABS 0.4 07/10/2017 0859   EOSABS 0.1 07/10/2017 0859   BASOSABS 0.0 07/10/2017 0859    No results found for: POCLITH, LITHIUM   No results found for: PHENYTOIN, PHENOBARB, VALPROATE, CBMZ   .res Assessment: Plan:   Will continue of Evekeo 10 mg in the morning and 5 mg midday for attention deficit disorder. Mood and anxiety signs and symptoms are well controlled at this time. Patient to follow-up in 3 months or sooner if clinically indicated. Patient advised to contact office with any questions, adverse effects, or acute worsening in signs and symptoms.  Kevaughn was seen today for follow-up.  Diagnoses and all orders for this visit:  Attention deficit hyperactivity disorder  (ADHD), combined type -     Amphetamine Sulfate (EVEKEO) 10 MG TABS; Take 10 mg by mouth every morning AND 5 mg daily at 12 noon. -     Amphetamine Sulfate (EVEKEO) 10 MG TABS; Take 10 mg by mouth every morning AND 5 mg daily after lunch. -     Amphetamine Sulfate (EVEKEO) 10 MG TABS; Take 10 mg by mouth every morning AND 5 mg daily at 12 noon.  Recurrent major depressive disorder, in full remission [facility])     Please see After Visit Summary for patient specific instructions.  Future Appointments  Date Time Provider Department Center  11/17/2019  8:15 AM [place], [place] T, [place] [facility]  02/11/2020  8:30 AM Corie Chiquito, PMHNP [facility] None    No orders of the defined types were placed in this encounter.   -------------------------------

## 2019-11-17 ENCOUNTER — Ambulatory Visit: Payer: BC Managed Care – PPO | Admitting: Podiatry

## 2019-12-09 DIAGNOSIS — M778 Other enthesopathies, not elsewhere classified: Secondary | ICD-10-CM | POA: Diagnosis not present

## 2019-12-09 DIAGNOSIS — M654 Radial styloid tenosynovitis [de Quervain]: Secondary | ICD-10-CM | POA: Diagnosis not present

## 2019-12-09 DIAGNOSIS — M25531 Pain in right wrist: Secondary | ICD-10-CM | POA: Diagnosis not present

## 2019-12-10 ENCOUNTER — Telehealth: Payer: Self-pay | Admitting: Psychiatry

## 2019-12-10 DIAGNOSIS — F411 Generalized anxiety disorder: Secondary | ICD-10-CM

## 2019-12-10 DIAGNOSIS — F330 Major depressive disorder, recurrent, mild: Secondary | ICD-10-CM

## 2019-12-10 MED ORDER — SERTRALINE HCL 50 MG PO TABS: ORAL_TABLET | ORAL | 1 refills | Status: DC

## 2019-12-10 NOTE — Telephone Encounter (Signed)
Returned call to pt. He reports that he has "been in a slump." He reports that he has different coping mechanisms that seem to help him maintain stability. He reports having irritability at times and feeling overwhelmed. He denies significant anxiety or worry. He reports feelings of loneliness. Energy has been ok. Motivation is low. He reports that his sleep varies. He reports that his appetite varies.   Denies SI. He reports that has been having passive death wishes.   He reports that he did not take Sertraline consistently in the past.   Discussed potential benefits, risks, and side effects of Sertraline. Pt agrees to starting Sertraline. Start Sertraline 25 mg po qd for 2-4 days, then increase to 50 mg po qd for 1 week, then increase to 100 mg po qd for mood and anxiety.  Patient advised to contact office with any questions, adverse effects, or acute worsening in signs and symptoms.

## 2019-12-10 NOTE — Telephone Encounter (Signed)
Pt left message stating he would like to try an antidepressant again. Contact # [phone]. Next f/u 10/14

## 2019-12-19 DIAGNOSIS — Z20822 Contact with and (suspected) exposure to covid-19: Secondary | ICD-10-CM | POA: Diagnosis not present

## 2020-01-01 ENCOUNTER — Other Ambulatory Visit: Payer: Self-pay | Admitting: Psychiatry

## 2020-01-01 DIAGNOSIS — F411 Generalized anxiety disorder: Secondary | ICD-10-CM

## 2020-01-01 DIAGNOSIS — M19041 Primary osteoarthritis, right hand: Secondary | ICD-10-CM | POA: Diagnosis not present

## 2020-01-01 DIAGNOSIS — M654 Radial styloid tenosynovitis [de Quervain]: Secondary | ICD-10-CM | POA: Diagnosis not present

## 2020-01-01 DIAGNOSIS — F330 Major depressive disorder, recurrent, mild: Secondary | ICD-10-CM

## 2020-01-05 NOTE — Telephone Encounter (Signed)
Next apt 01/14/2020  Change dose? 90 day request

## 2020-01-08 ENCOUNTER — Other Ambulatory Visit: Payer: Self-pay

## 2020-01-08 ENCOUNTER — Ambulatory Visit (INDEPENDENT_AMBULATORY_CARE_PROVIDER_SITE_OTHER): Payer: BC Managed Care – PPO | Admitting: Psychiatry

## 2020-01-08 DIAGNOSIS — F330 Major depressive disorder, recurrent, mild: Secondary | ICD-10-CM

## 2020-01-08 DIAGNOSIS — F411 Generalized anxiety disorder: Secondary | ICD-10-CM

## 2020-01-08 DIAGNOSIS — Z636 Dependent relative needing care at home: Secondary | ICD-10-CM | POA: Diagnosis not present

## 2020-01-08 NOTE — Progress Notes (Signed)
Psychotherapy Progress Note [facility] [name], PhD LP  Patient ID: Mark Fritz     MRN: [id number] Therapy format: Individual psychotherapy Date: 01/08/2020      Start: 2:11p     Stop: 3:00p     Time Spent: 49 min Location: In-person   Session narrative (presenting needs, interim history, self-report of stressors and symptoms, applications of prior therapy, status changes, and interventions made in session) Since last seen in January, found himself in a dark place again, resumed Zoloft again.  Not sure it's the right agent, seems like almost everyone he knows who's gone on antidepressant migrated from SSRI and settled on Wellbutrin.  May pursue with psychiatrist at discretion.  Re. work stress, has resolved it's OK to have some boring duties in exchange for the pay and freedom he has with his current work.  Less worried about his work not mattering or being left behind in ego-driven competition for income and socioeconomic status.  Can be greatly angered by witnessed unfairness, e.g., a lazy colleague indulged by superiors and his dedicated boss doing minutes for a meeting when she should be freer to focus on leadership tasks.  Pt considered and offered himself to do the essentially boring task out of a sense of duty to what is right organizationally.  Characterized as a Retail buyer move by an Audiological scientist (also irritating), recognized in session as applying his own moral principles  At home, discerned that he had come to feel like he didn't have a spouse, he had a "patient", between [name]'s OCD, thalassemia, and suspected fibromyalgia.  Lots of reassurance seeking and asking him what she should do, which he has been able to tamp down by asking her to give him space.  Unfortunately, a request to limit herself about particular kinds of moments or conversations typically turns into trying not to engage him at all, which he knows isn't healthy for her, either.   One particularly galling kind of moment where he may be just passing through a room and she seems to strike up an irrelevant or unnecessary conversation just to hold him there.  Discussed at some length responses to [facility], with offer of further problem-solving in how to recruit her to face down her own anxiety and compulsions.  One simple idea just to ask her if a subject can wait, which hopefully preserves the adult-adult relationship frame and works her ability to gauge need, self-soothe, and check in with herself rather than rely.  Serendipitous find that letting [name] sleep in one Saturday had her better rested, more flexible, and gave him some unpressured time with the kids anda sense of accomplishment.  May make it a more regular thing.  Knows he needs to attack depressive thinking more.  Has gotten above the old problem of living out of obligation to his family.  Knows he can still be assaulted with jealousy of others' lives.  Educated on subconscious demands and the cognitive triad, illustrated how demanding thinking intensifies distress.  Offered possibility of more standard cognitive therapy, with core principles of telling oneself the truth about things and being humane and offered informal thought journaling as a start.  Primacy seems to be on improving control over irritations.  Notes seeing more politicization and marginalization among his social network, including church associates, and even with substantial, scientifically valid support from pastor, significant numbers of vocal anti-mask, anti-vax people, which becomes alarming, grieving, and frustrating altogether.  Relates how he was surprised to find  himself and his family ostracized by a half-dozen for putting a presidential candidate's sticker on their car.  Interpreted substantial loneliness at work, home, and church as a driving factor and, contrary to PT's worry, assured him that he is not a "narcissist" for thinking he knows better or  being frustrated -- he is honesty better educated, intelligent, and better-schooled in logic than many people he knows, and it makes him an intellectual minority.  Lightheaded episode in session, possibly tied to emotional subject but remedied by allowing to de-mask and breathe normally.  Interpreted as hypoventilation driven (not due to mask but due to instinctively pausing breath in response to it and running down air supply while talking).  Therapeutic modalities: Cognitive Behavioral Therapy, Solution-Oriented/Positive Psychology, Ego-Supportive and Psycho-education/Bibliotherapy  Mental Status/Observations:  Appearance:   Casual     Behavior:  Appropriate  Motor:  Normal  Speech/Language:   Clear and Coherent  Affect:  Appropriate  Mood:  irritable and responsive to [employer] process:  normal and deep-thinking  Thought content:    WNL and analytical  Sensory/Perceptual disturbances:    WNL  Orientation:  Fully oriented  Attention:  Good    Concentration:  Good  Memory:  WNL  Insight:    Good  Judgment:   Good  Impulse Control:  Good   Risk Assessment: Danger to Self: No Self-injurious Behavior: No Danger to Others: No Physical Aggression / Violence: No Duty to Warn: No Access to Firearms a concern: No  Assessment of progress:  stabilized  Diagnosis:   ICD-10-CM   1. Mild episode of recurrent major depressive disorder (HCC)  F33.0   2. Generalized anxiety disorder  F41.1   3. Caregiver stress  Z63.6    Plan:   Option to formal cognitive therapy or informal thought journaling  Try asking [name] if she can wait for an inconvenient conversation  Other recommendations/advice as may be noted above  Continue to utilize previously learned skills ad lib  Maintain medication as prescribed and work faithfully with relevant prescriber(s) if any changes are desired or seem indicated  Call the clinic on-call service, present to ER, or call 911 if any life-threatening  psychiatric crisis Return in about 2 weeks (around 01/22/2020) for time as available, recommend scheduling ahead.  Already scheduled visit in this office 01/14/2020.  Robley Fries, PhD [name], PhD LP Clinical Psychologist, [facility] [address] [place], [place] [address] [phone]

## 2020-01-14 ENCOUNTER — Other Ambulatory Visit: Payer: Self-pay

## 2020-01-14 ENCOUNTER — Encounter: Payer: Self-pay | Admitting: Psychiatry

## 2020-01-14 ENCOUNTER — Ambulatory Visit (INDEPENDENT_AMBULATORY_CARE_PROVIDER_SITE_OTHER): Payer: BC Managed Care – PPO | Admitting: Psychiatry

## 2020-01-14 DIAGNOSIS — F411 Generalized anxiety disorder: Secondary | ICD-10-CM | POA: Diagnosis not present

## 2020-01-14 DIAGNOSIS — F902 Attention-deficit hyperactivity disorder, combined type: Secondary | ICD-10-CM

## 2020-01-14 DIAGNOSIS — F3341 Major depressive disorder, recurrent, in partial remission: Secondary | ICD-10-CM | POA: Diagnosis not present

## 2020-01-14 MED ORDER — SERTRALINE HCL 50 MG PO TABS: ORAL_TABLET | ORAL | 1 refills | Status: DC

## 2020-01-14 MED ORDER — BUPROPION HCL ER (SR) 100 MG PO TB12: 100.0000 mg | ORAL_TABLET | Freq: Every day | ORAL | 0 refills | Status: DC

## 2020-01-14 MED ORDER — AMPHETAMINE SULFATE 10 MG PO TABS: ORAL_TABLET | ORAL | 0 refills | Status: DC

## 2020-01-14 NOTE — Progress Notes (Signed)
Mark Fritz [id number] November 24, 1985 34 y.o.  Subjective:   Patient ID:  Mark Fritz is a 34 y.o. (DOB July 09, 1985) male.  Chief Complaint:  Chief Complaint  Patient presents with  . Depression  . Anxiety  . ADD    HPI Mark Fritz presents to the office today for follow-up of depression and anxiety. He reports, "I think I can tell that the medication helps." He reports that he notices that he now has control over his reactions in situations that he did not previously have control.   He reports that he has had several side effects and was not able to titrate beyond one tablet daily due to side effects. Initially had some GI side effects and these have resolved. Now having problematic sexual side effects. He reports significant difficulty with concentration. Also experiencing significant restlessness. Reports that he is having both physical and mental restlessness. Having significant sleep difficulties and using more Benadryl and Unisom than he has ever taken. Now averaging about 6 hours with OTC sleep meds. Taking Sertraline in the morning.   He reports that anxiety has improved some. He reports some improvement in depressed mood. Feels that mood and anxiety have been fairly well controlled. Energy has improved with using OTC sleep meds and increased sleep. Motivation is adequate aside from distractibility. Increased interest in things. Appetite has been slightly increased.Denies SI.   Some stress with children returning to school and coordination of carpooling.  Never had a seizure.   Past Psychiatric Medication Trials: Sertraline-adverse effects Adderall-decreased appetite, increased irritability Evekeo-effective for attention deficit with out significant tolerability issues    PHQ2-9     Office Visit from 02/07/2017 in [facility]  PHQ-2 Total Score 4  PHQ-9 Total Score 15       Review of Systems:  Review of Systems  Gastrointestinal:  Negative.   Musculoskeletal: Negative for gait problem.  Neurological: Negative for tremors.  Psychiatric/Behavioral:       Please refer to HPI    Medications: I have reviewed the patient's current medications.  Current Outpatient Medications  Medication Sig Dispense Refill  . Amphetamine Sulfate (EVEKEO) 10 MG TABS Take 10 mg by mouth every morning AND 5 mg daily at 12 noon. 30 tablet 0  . Amphetamine Sulfate (EVEKEO) 10 MG TABS Take 10 mg by mouth every morning AND 5 mg daily after lunch. 45 tablet 0  . doxylamine, Sleep, (UNISOM) 25 MG tablet Take 12.5 mg by mouth at bedtime as needed.     . Famotidine (PEPCID PO) Take by mouth daily as needed.    . Melatonin 3 MG TABS Take by mouth at bedtime as needed.    . meloxicam (MOBIC) 15 MG tablet Take 1 tablet (15 mg total) by mouth daily. 30 tablet 3  . [START ON 02/11/2020] Amphetamine Sulfate (EVEKEO) 10 MG TABS Take 10 mg by mouth every morning AND 5 mg daily at 12 noon. 45 tablet 0  . buPROPion (WELLBUTRIN SR) 100 MG 12 hr tablet Take 1 tablet (100 mg total) by mouth daily. 30 tablet 0  . diclofenac Sodium (VOLTAREN) 1 % GEL Apply topically 4 (four) times daily.    . nitroGLYCERIN (NITRODUR - DOSED IN MG/24 HR) 0.2 mg/hr patch Apply 1/4 patch daily to tendon for tendonitis. 30 patch 1  . sertraline (ZOLOFT) 50 MG tablet Take 1/2 tab po qd x 6 days, then stop 60 tablet 1   No current facility-administered medications for this visit.  Medication Side Effects: Sleep Problems and Other: Sexual side effects, impaired concentration, restlessness  Allergies: No Known Allergies  Past Medical History:  Diagnosis Date  . ADHD (attention deficit hyperactivity disorder)   . De Quervain's tenosynovitis, right 12/2017  . Dental crowns present    and 1 implant  . History of gastric ulcer   . 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 Depression Mother   . Diabetes Mother   .  Miscarriages / [place] Mother   . Hearing loss Father   . Hypertension Father   . Mental illness Maternal Grandmother   . Hypertension Paternal Grandfather   . Stroke Paternal Grandfather   . Depression Sister     Social History   Socioeconomic History  . Marital status: Married    Spouse name: Not on file  . Number of children: Not on file  . Years of education: Not on file  . Highest education level: Not on file  Occupational History  . Not on file  Tobacco Use  . Smoking status: Never Smoker  . Smokeless tobacco: Never Used  Vaping Use  . Vaping Use: Never used  Substance and Sexual Activity  . Alcohol use: No  . Drug use: No  . Sexual activity: Yes  Other Topics Concern  . Not on file  Social History Narrative  . Not on file   Social Determinants of Health   Financial Resource Strain:   . Difficulty of Paying Living Expenses: Not on file  Food Insecurity:   . Worried About Programme researcher, broadcasting/film/videounning Out of Food in the Last Year: Not on file  . Ran Out of Food in the Last Year: Not on file  Transportation Needs:   . Lack of Transportation (Medical): Not on file  . Lack of Transportation (Non-Medical): Not on file  Physical Activity:   . Days of Exercise per Week: Not on file  . Minutes of Exercise per Session: Not on file  Stress:   . Feeling of Stress : Not on file  Social Connections:   . Frequency of Communication with Friends and Family: Not on file  . Frequency of Social Gatherings with Friends and Family: Not on file  . Attends Religious Services: Not on file  . Active Member of Clubs or Organizations: Not on file  . Attends BankerClub or Organization Meetings: Not on file  . Marital Status: Not on file  Intimate Partner Violence:   . Fear of Current or Ex-Partner: Not on file  . Emotionally Abused: Not on file  . Physically Abused: Not on file  . Sexually Abused: Not on file    Past Medical History, Surgical history, Social history, and Family history were reviewed  and updated as appropriate.   Please see review of systems for further details on the patient's review from today.   Objective:   Physical Exam:  There were no vitals taken for this visit.  Physical Exam Constitutional:      General: He is not in acute distress. Musculoskeletal:        General: No deformity.  Neurological:     Mental Status: He is alert and oriented to person, place, and time.     Coordination: Coordination normal.  Psychiatric:        Attention and Perception: Attention and perception normal. He does not perceive auditory or visual hallucinations.        Mood and  Affect: Mood normal. Mood is not anxious or depressed. Affect is not labile, blunt, angry or inappropriate.        Speech: Speech normal.        Behavior: Behavior normal.        Thought Content: Thought content normal. Thought content is not paranoid or delusional. Thought content does not include homicidal or suicidal ideation. Thought content does not include homicidal or suicidal plan.        Cognition and Memory: Cognition and memory normal.        Judgment: Judgment normal.     Comments: Insight intact     Lab Review:     Component Value Date/Time   NA 138 07/10/2017 0859   K 4.2 07/10/2017 0859   CL 101 07/10/2017 0859   CO2 30 07/10/2017 0859   GLUCOSE 107 (H) 07/10/2017 0859   BUN 18 07/10/2017 0859   CREATININE 1.04 07/10/2017 0859   CALCIUM 10.0 07/10/2017 0859   PROT 7.5 07/10/2017 0859   ALBUMIN 4.8 07/10/2017 0859   AST 21 07/10/2017 0859   ALT 30 07/10/2017 0859   ALKPHOS 49 07/10/2017 0859   BILITOT 0.5 07/10/2017 0859       Component Value Date/Time   WBC 5.4 07/10/2017 0859   RBC 5.39 07/10/2017 0859   HGB 15.6 07/10/2017 0859   HCT 45.0 07/10/2017 0859   PLT 250.0 07/10/2017 0859   MCV 83.5 07/10/2017 0859   MCHC 34.7 07/10/2017 0859   RDW 13.0 07/10/2017 0859   LYMPHSABS 2.1 07/10/2017 0859   MONOABS 0.4 07/10/2017 0859   EOSABS 0.1 07/10/2017 0859   BASOSABS  0.0 07/10/2017 0859    No results found for: POCLITH, LITHIUM   No results found for: PHENYTOIN, PHENOBARB, VALPROATE, CBMZ   .res Assessment: Plan:   Discussed discontinuing sertraline due to multiple side effects.  Will decrease sertraline to 25 mg daily for 6 days, then discontinue. Discussed other possible medications for the treatment of depression and anxiety that may have less risk of causing cognitive side effects and concentration difficulties, and also treatment options that may have lower risk of sexual side effects.  Patient asks about Wellbutrin since he reports that his sister, who seems to respond similarly to medications, has had a favorable response to Wellbutrin.  Discussed potential benefits, risks, and side effects of Wellbutrin.  Patient agrees to trial of Wellbutrin.  Will start Wellbutrin SR 100 mg daily.  Discussed that further titration may be needed. Will continue of Evekeo 10 mg in the morning and 5 mg at lunch.  Discussed that he may want to reduce dose of Evekeo slightly when first initiating Wellbutrin to minimize risk of increased activation. Patient follow-up in 4 weeks or sooner if clinically indicated. Patient advised to contact office with any questions, adverse effects, or acute worsening in signs and symptoms.  Orlyn was seen today for depression, anxiety and add.  Diagnoses and all orders for this visit:  Recurrent major depressive disorder, in partial remission (HCC) -     buPROPion (WELLBUTRIN SR) 100 MG 12 hr tablet; Take 1 tablet (100 mg total) by mouth daily.  Generalized anxiety disorder -     sertraline (ZOLOFT) 50 MG tablet; Take 1/2 tab po qd x 6 days, then stop  Attention deficit hyperactivity disorder (ADHD), combined type -     Amphetamine Sulfate (EVEKEO) 10 MG TABS; Take 10 mg by mouth every morning AND 5 mg daily at 12 noon.  Please see After Visit Summary for patient specific instructions.  Future Appointments  Date Time  Provider Department Center  02/02/2020  2:00 PM Robley Fries, PhD [facility] None  02/12/2020 10:00 AM Corie Chiquito, PMHNP [facility] None    No orders of the defined types were placed in this encounter.   -------------------------------

## 2020-01-25 DIAGNOSIS — M7662 Achilles tendinitis, left leg: Secondary | ICD-10-CM | POA: Diagnosis not present

## 2020-01-25 DIAGNOSIS — M9903 Segmental and somatic dysfunction of lumbar region: Secondary | ICD-10-CM | POA: Diagnosis not present

## 2020-01-25 DIAGNOSIS — M9901 Segmental and somatic dysfunction of cervical region: Secondary | ICD-10-CM | POA: Diagnosis not present

## 2020-01-25 DIAGNOSIS — M9907 Segmental and somatic dysfunction of upper extremity: Secondary | ICD-10-CM | POA: Diagnosis not present

## 2020-02-01 DIAGNOSIS — M9907 Segmental and somatic dysfunction of upper extremity: Secondary | ICD-10-CM | POA: Diagnosis not present

## 2020-02-01 DIAGNOSIS — M9901 Segmental and somatic dysfunction of cervical region: Secondary | ICD-10-CM | POA: Diagnosis not present

## 2020-02-01 DIAGNOSIS — M7662 Achilles tendinitis, left leg: Secondary | ICD-10-CM | POA: Diagnosis not present

## 2020-02-01 DIAGNOSIS — M9903 Segmental and somatic dysfunction of lumbar region: Secondary | ICD-10-CM | POA: Diagnosis not present

## 2020-02-02 ENCOUNTER — Other Ambulatory Visit: Payer: Self-pay

## 2020-02-02 ENCOUNTER — Ambulatory Visit (INDEPENDENT_AMBULATORY_CARE_PROVIDER_SITE_OTHER): Payer: BC Managed Care – PPO | Admitting: Psychiatry

## 2020-02-02 DIAGNOSIS — F330 Major depressive disorder, recurrent, mild: Secondary | ICD-10-CM | POA: Diagnosis not present

## 2020-02-02 DIAGNOSIS — Z636 Dependent relative needing care at home: Secondary | ICD-10-CM | POA: Diagnosis not present

## 2020-02-02 DIAGNOSIS — F902 Attention-deficit hyperactivity disorder, combined type: Secondary | ICD-10-CM | POA: Diagnosis not present

## 2020-02-02 DIAGNOSIS — F411 Generalized anxiety disorder: Secondary | ICD-10-CM

## 2020-02-02 NOTE — Progress Notes (Signed)
Psychotherapy Progress Note [facility] [name], PhD LP  Patient ID: Mark Fritz     MRN: [id number] Therapy format: Individual psychotherapy Date: 02/02/2020      Start: 2:10p     Stop: 3:00p     Time Spent: 50 min Location: In-person   Session narrative (presenting needs, interim history, self-report of stressors and symptoms, applications of prior therapy, status changes, and interventions made in session) Review of EHR notes dfxs with medication SE and higher use of OTC sleep aids.  Decisions made to titrate off sertraline, begin Wellbutrin, continue Evekeo with option to reduce it as Wellbutrin takes effect, 4-wk f/u.  Has discovered that Wellbutrin puts him on a hair trigger for anger, so he has come off it.    Acknowledges continuing wear and tear from a system at work which seems not to reward interests and goals and sends desired opportunity to an undeserving and Education officer, environmental instead of himself.  Acknowledges this kind of thing does make him angry, and he does have to watch his tongue often enough.  Validated again that PT is highly principled and quicker than most to see issues in organization, process, and integrity, meaning he is fundamentally more tempted to object. While nothing wrong with that, it does mean he bears a higher challenge to be kind and to ensure the difference between being assertive and punitive with others.  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  Today notes they have found a house and are listing theirs now.   Been busy with moving furniture, staging.  Big blessing in finding a dedicated home office space.    [name] doing well, hopeful and engaged with the moving process.  Actually busy tends to ease up her worry/OCD.  Stress of 34yo requiring expensive growth hormone shots.    Therapeutic modalities: Cognitive Behavioral Therapy and  Solution-Oriented/Positive Psychology  Mental Status/Observations:  Appearance:   Casual     Behavior:  Appropriate  Motor:  Normal  Speech/Language:   Clear and Coherent  Affect:  Appropriate  Mood:  dysthymic  Thought process:  normal  Thought content:    WNL  Sensory/Perceptual disturbances:    WNL  Orientation:  Fully oriented  Attention:  Good    Concentration:  Good  Memory:  WNL  Insight:    Good  Judgment:   Good  Impulse Control:  Good   Risk Assessment: Danger to Self: No Self-injurious Behavior: No Danger to Others: No Physical Aggression / Violence: No Duty to Warn: No Access to Firearms a concern: No  Assessment of progress:  progressing  Diagnosis:   ICD-10-CM   1. Mild episode of recurrent major depressive disorder (HCC)  F33.0   2. Generalized anxiety disorder  F41.1   3. Attention deficit hyperactivity disorder (ADHD), combined type  F90.2   4. Caregiver stress  Z63.6    Plan:  . Inquire and advocate as desired with workplace; when not confronting, self-remind that he is doing so for cogent, conscious, or strategic reasons . Other recommendations/advice as may be noted above . Continue to utilize previously learned skills ad lib . Maintain medication as prescribed and work faithfully with relevant prescriber(s) if any changes are desired or seem indicated . Call the clinic on-call service, present to ER, or call 911 if any life-threatening psychiatric crisis Return in about 1 month (around 03/04/2020) for time at discretion. Marland Kitchen  Already scheduled visit in this office 02/12/2020.  Robley Fries, PhD [name], PhD LP Clinical Psychologist, [facility] [address] [place], [place] [address] [phone]

## 2020-02-05 ENCOUNTER — Other Ambulatory Visit: Payer: Self-pay | Admitting: Psychiatry

## 2020-02-05 DIAGNOSIS — F3341 Major depressive disorder, recurrent, in partial remission: Secondary | ICD-10-CM

## 2020-02-08 DIAGNOSIS — M7662 Achilles tendinitis, left leg: Secondary | ICD-10-CM | POA: Diagnosis not present

## 2020-02-08 DIAGNOSIS — M9907 Segmental and somatic dysfunction of upper extremity: Secondary | ICD-10-CM | POA: Diagnosis not present

## 2020-02-08 DIAGNOSIS — M9903 Segmental and somatic dysfunction of lumbar region: Secondary | ICD-10-CM | POA: Diagnosis not present

## 2020-02-08 DIAGNOSIS — M9901 Segmental and somatic dysfunction of cervical region: Secondary | ICD-10-CM | POA: Diagnosis not present

## 2020-02-08 NOTE — Telephone Encounter (Signed)
New start, has apt 10/15

## 2020-02-11 ENCOUNTER — Ambulatory Visit: Payer: BC Managed Care – PPO | Admitting: Psychiatry

## 2020-02-12 ENCOUNTER — Ambulatory Visit (INDEPENDENT_AMBULATORY_CARE_PROVIDER_SITE_OTHER): Payer: BC Managed Care – PPO | Admitting: Psychiatry

## 2020-02-12 ENCOUNTER — Encounter: Payer: Self-pay | Admitting: Psychiatry

## 2020-02-12 ENCOUNTER — Other Ambulatory Visit: Payer: Self-pay

## 2020-02-12 DIAGNOSIS — F411 Generalized anxiety disorder: Secondary | ICD-10-CM | POA: Diagnosis not present

## 2020-02-12 DIAGNOSIS — F330 Major depressive disorder, recurrent, mild: Secondary | ICD-10-CM

## 2020-02-12 DIAGNOSIS — F902 Attention-deficit hyperactivity disorder, combined type: Secondary | ICD-10-CM

## 2020-02-12 MED ORDER — VORTIOXETINE HBR 5 MG PO TABS: ORAL_TABLET | ORAL | 0 refills | Status: DC

## 2020-02-12 MED ORDER — VORTIOXETINE HBR 5 MG PO TABS: 5.0000 mg | ORAL_TABLET | Freq: Every day | ORAL | 0 refills | Status: DC

## 2020-02-12 NOTE — Progress Notes (Signed)
Mark Fritz [id number] 10-Jan-1986 34 y.o.  Subjective:   Patient ID:  Mark Fritz is a 34 y.o. (DOB April 02, 1986) male.  Chief Complaint:  Chief Complaint  Patient presents with  . Depression  . Anxiety    HPI Mark Fritz presents to the office today for follow-up of depression and anxiety. He reports that he had irritability with Wellbutrin. He reports, "I am probably in the early stages of depression" and notices anger which he associates with depression. He notices some anxiety and worry. He reports improved sleep since stopping caffeine. He reports anxiety is also better without caffeine. Motivation has been low. He reports that his energy has been ok. Appetite has been good. He reports that concentration has been improved without caffeine since concentration is worse with anxiety.  Denies SI. He reports thoughts of not wanting to keep living at times. Some feelings of worthlessness and hopelessness.   They found a house they like and have sold their house.  Past Psychiatric Medication Trials: Sertraline-adverse effects (initial GI side effects, insomnia, cognitive side effects, sexual side effects) Wellbutrin- irritability Adderall-decreased appetite, increased irritability Evekeo-effective for attention deficit with out significant tolerability issues  PHQ2-9     Office Visit from 02/07/2017 in [facility]  PHQ-2 Total Score 4  PHQ-9 Total Score 15       Review of Systems:  Review of Systems  Musculoskeletal: Negative for gait problem.       Decreased pain in wrist and ankle.   Neurological: Negative for tremors.  Psychiatric/Behavioral:       Please refer to HPI    Medications: I have reviewed the patient's current medications.  Current Outpatient Medications  Medication Sig Dispense Refill  . Amphetamine Sulfate (EVEKEO) 10 MG TABS Take 10 mg by mouth every morning AND 5 mg daily at 12 noon. 30 tablet 0  . Amphetamine Sulfate  (EVEKEO) 10 MG TABS Take 10 mg by mouth every morning AND 5 mg daily at 12 noon. 45 tablet 0  . Famotidine (PEPCID PO) Take by mouth daily as needed.    . Amphetamine Sulfate (EVEKEO) 10 MG TABS Take 10 mg by mouth every morning AND 5 mg daily after lunch. 45 tablet 0  . diclofenac Sodium (VOLTAREN) 1 % GEL Apply topically 4 (four) times daily.    Marland Kitchen doxylamine, Sleep, (UNISOM) 25 MG tablet Take 12.5 mg by mouth at bedtime as needed.     . Melatonin 3 MG TABS Take by mouth at bedtime as needed.    . meloxicam (MOBIC) 15 MG tablet Take 1 tablet (15 mg total) by mouth daily. (Patient not taking: Reported on 02/12/2020) 30 tablet 3  . nitroGLYCERIN (NITRODUR - DOSED IN MG/24 HR) 0.2 mg/hr patch Apply 1/4 patch daily to tendon for tendonitis. 30 patch 1  . vortioxetine HBr (TRINTELLIX) 5 MG TABS tablet Take 1 tablet (5 mg total) by mouth daily. 30 tablet 0   No current facility-administered medications for this visit.    Medication Side Effects: None  Allergies: No Known Allergies  Past Medical History:  Diagnosis Date  . ADHD (attention deficit hyperactivity disorder)   . De Quervain's tenosynovitis, right 12/2017  . Dental crowns present    and 1 implant  . History of gastric ulcer   . 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 Depression Mother   . Diabetes Mother   .  Miscarriages / [place] Mother   . Hearing loss Father   . Hypertension Father   . Mental illness Maternal Grandmother   . Hypertension Paternal Grandfather   . Stroke Paternal Grandfather   . Depression Sister     Social History   Socioeconomic History  . Marital status: Married    Spouse name: Not on file  . Number of children: Not on file  . Years of education: Not on file  . Highest education level: Not on file  Occupational History  . Not on file  Tobacco Use  . Smoking status: Never Smoker  . Smokeless tobacco: Never Used  Vaping Use  .  Vaping Use: Never used  Substance and Sexual Activity  . Alcohol use: No  . Drug use: No  . Sexual activity: Yes  Other Topics Concern  . Not on file  Social History Narrative  . Not on file   Social Determinants of Health   Financial Resource Strain:   . Difficulty of Paying Living Expenses: Not on file  Food Insecurity:   . Worried About Programme researcher, broadcasting/film/video in the Last Year: Not on file  . Ran Out of Food in the Last Year: Not on file  Transportation Needs:   . Lack of Transportation (Medical): Not on file  . Lack of Transportation (Non-Medical): Not on file  Physical Activity:   . Days of Exercise per Week: Not on file  . Minutes of Exercise per Session: Not on file  Stress:   . Feeling of Stress : Not on file  Social Connections:   . Frequency of Communication with Friends and Family: Not on file  . Frequency of Social Gatherings with Friends and Family: Not on file  . Attends Religious Services: Not on file  . Active Member of Clubs or Organizations: Not on file  . Attends Banker Meetings: Not on file  . Marital Status: Not on file  Intimate Partner Violence:   . Fear of Current or Ex-Partner: Not on file  . Emotionally Abused: Not on file  . Physically Abused: Not on file  . Sexually Abused: Not on file    Past Medical History, Surgical history, Social history, and Family history were reviewed and updated as appropriate.   Please see review of systems for further details on the patient's review from today.   Objective:   Physical Exam:  There were no vitals taken for this visit.  Physical Exam Constitutional:      General: He is not in acute distress. Musculoskeletal:        General: No deformity.  Neurological:     Mental Status: He is alert and oriented to person, place, and time.     Coordination: Coordination normal.  Psychiatric:        Attention and Perception: Attention and perception normal. He does not perceive auditory or visual  hallucinations.        Mood and Affect: Mood normal. Mood is not anxious or depressed. Affect is not labile, blunt, angry or inappropriate.        Speech: Speech normal.        Behavior: Behavior normal.        Thought Content: Thought content normal. Thought content is not paranoid or delusional. Thought content does not include homicidal or suicidal ideation. Thought content does not include homicidal or suicidal plan.        Cognition and Memory: Cognition and memory normal.  Judgment: Judgment normal.     Comments: Insight intact     Lab Review:     Component Value Date/Time   NA 138 07/10/2017 0859   K 4.2 07/10/2017 0859   CL 101 07/10/2017 0859   CO2 30 07/10/2017 0859   GLUCOSE 107 (H) 07/10/2017 0859   BUN 18 07/10/2017 0859   CREATININE 1.04 07/10/2017 0859   CALCIUM 10.0 07/10/2017 0859   PROT 7.5 07/10/2017 0859   ALBUMIN 4.8 07/10/2017 0859   AST 21 07/10/2017 0859   ALT 30 07/10/2017 0859   ALKPHOS 49 07/10/2017 0859   BILITOT 0.5 07/10/2017 0859       Component Value Date/Time   WBC 5.4 07/10/2017 0859   RBC 5.39 07/10/2017 0859   HGB 15.6 07/10/2017 0859   HCT 45.0 07/10/2017 0859   PLT 250.0 07/10/2017 0859   MCV 83.5 07/10/2017 0859   MCHC 34.7 07/10/2017 0859   RDW 13.0 07/10/2017 0859   LYMPHSABS 2.1 07/10/2017 0859   MONOABS 0.4 07/10/2017 0859   EOSABS 0.1 07/10/2017 0859   BASOSABS 0.0 07/10/2017 0859    No results found for: POCLITH, LITHIUM   No results found for: PHENYTOIN, PHENOBARB, VALPROATE, CBMZ   .res Assessment: Plan:   Discussed potential benefits, risks, and side effects of several possible treatment options to include Trintellix and Viibryd.  Will start trial of Trintellix 5 mg daily with a meal for depression, anxiety, and to improve cognitive processing speed. Recommend continuing Evekeo 10 mg in the morning and 5 mg midday for concentration. Patient to follow-up in 4 to 6 weeks or sooner if clinically  indicated. Recommend continuing psychotherapy with [name], PhD. Patient advised to contact office with any questions, adverse effects, or acute worsening in signs and symptoms.  Roosevelt was seen today for depression and anxiety.  Diagnoses and all orders for this visit:  Mild episode of recurrent major depressive disorder (HCC) -     Discontinue: vortioxetine HBr (TRINTELLIX) 5 MG TABS tablet; Take 1 tablet (5 mg total) by mouth daily for 7 days, THEN 2 tablets (10 mg total) daily for 28 days. -     vortioxetine HBr (TRINTELLIX) 5 MG TABS tablet; Take 1 tablet (5 mg total) by mouth daily.  Generalized anxiety disorder  Attention deficit hyperactivity disorder (ADHD), combined type     Please see After Visit Summary for patient specific instructions.  Future Appointments  Date Time Provider Department Center  03/07/2020  3:00 PM Robley Fries, PhD [facility] None  03/22/2020  9:00 AM Corie Chiquito, PMHNP [facility] None    No orders of the defined types were placed in this encounter.   -------------------------------

## 2020-02-15 ENCOUNTER — Telehealth: Payer: Self-pay | Admitting: Psychiatry

## 2020-02-15 NOTE — Telephone Encounter (Signed)
Pt LM on VM stated having side effects from new med. Side effects aren't bad @ this time,  just advised to report to provider. Contact @ [phone] or [phone]. Apt 11/23

## 2020-02-16 NOTE — Telephone Encounter (Signed)
Pt will call back with any other concerns with side effects. Seems the diarrhea has stopped.

## 2020-02-16 NOTE — Telephone Encounter (Signed)
FYI, reporting back as advised

## 2020-02-22 NOTE — Telephone Encounter (Signed)
Noted  

## 2020-03-07 ENCOUNTER — Ambulatory Visit (INDEPENDENT_AMBULATORY_CARE_PROVIDER_SITE_OTHER): Payer: BC Managed Care – PPO | Admitting: Psychiatry

## 2020-03-07 ENCOUNTER — Other Ambulatory Visit: Payer: Self-pay

## 2020-03-07 DIAGNOSIS — F902 Attention-deficit hyperactivity disorder, combined type: Secondary | ICD-10-CM | POA: Diagnosis not present

## 2020-03-07 DIAGNOSIS — F330 Major depressive disorder, recurrent, mild: Secondary | ICD-10-CM

## 2020-03-07 DIAGNOSIS — Z636 Dependent relative needing care at home: Secondary | ICD-10-CM | POA: Diagnosis not present

## 2020-03-07 DIAGNOSIS — F411 Generalized anxiety disorder: Secondary | ICD-10-CM

## 2020-03-07 NOTE — Progress Notes (Signed)
Psychotherapy Progress Note [facility] [name], PhD LP  Patient ID: Mark Fritz     MRN: [id number] Therapy format: Individual psychotherapy Date: 03/07/2020      Start: 3:14p     Stop: 4:05p     Time Spent: 51 min Location: In-person   Session narrative (presenting needs, interim history, self-report of stressors and symptoms, applications of prior therapy, status changes, and interventions made in session) Moved to new house, very impressed with the neighbors.  Son got in [employer] magnet MS, 6th grade.  Fatigued from the move.  Kids are high-energy right now, and attention-seeking, but working through adjustment and settling in.  Overall satisfied.  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  Meanwhile, still feeling stuck in boring, low-credit, low-challenge, low-feedback, low-autonomy work that he says sometimes does not even require the skills of an attorney.  Able to take some validation from at least learning more about what else is out there, in other corporate cultures, and seeing how slim the pickings are.  Susceptible to procrastinating unwanted work, tends to build up to urgent backlogs and catch up on adrenaline.  Reviewed the  experience of feeling criticize-able regardless of how he performs.  At home, [name]'s OCD flares up for about 3 months, typically, when they move.  Trying to collaborate now on recognizing and refraining from her compulsive security measures and worry.  Hopeful that having a home office will better enable him to learn, create, and recharge vs. get overextended.  Feels he is at a steady place with the kids, including taking on growth hormone treatments for one son.     Self-care seems to be lagging -- not sleeping so well, eating poorly, sleep not necessarily sufficient.  Encouraged in tuning this up as he knows how, and as that is how he takes care of the worker who already feels put upon and/or wasted by others.  Therapeutic modalities: Cognitive Behavioral Therapy and Solution-Oriented/Positive Psychology  Mental Status/Observations:  Appearance:   Casual and Neat     Behavior:  Appropriate  Motor:  Normal  Speech/Language:   Clear and Coherent  Affect:  Appropriate  Mood:  dysthymic  Thought process:  normal  Thought content:    WNL  Sensory/Perceptual disturbances:    WNL  Orientation:  Fully oriented  Attention:  Good    Concentration:  Good  Memory:  WNL  Insight:    Good  Judgment:   Good  Impulse Control:  Good   Risk Assessment: Danger to Self: No Self-injurious Behavior: No Danger to Others: No Physical Aggression / Violence: No Duty to Warn: No Access to Firearms a concern: No  Assessment of progress:  progressing  Diagnosis:   ICD-10-CM   1. Mild episode of recurrent major depressive disorder (HCC)  F33.0   2. Generalized anxiety disorder  F41.1   3. Attention deficit hyperactivity disorder (ADHD), combined type  F90.2   4. Caregiver stress  Z63.6    Plan:  . Brush up self-care -- at least be the exception to people putting upon Mark Fritz . Observe best boundaries between work time/space and home time/space . Encourage in seeking alternative jobs . Try to break down procrastinated tasks into  small enough "bites" to limit self-imposed stress . Continue collaborative efforts to help Mark Fritz overcome her OCD.  Open to joint consultation if desired. . Other recommendations/advice as may be noted above . Continue to utilize previously learned skills ad lib . Maintain medication as prescribed and work faithfully with relevant prescriber(s) if any changes are desired or seem indicated . Call the clinic on-call service, present to  ER, or call 911 if any life-threatening psychiatric crisis Return for time as available. . Already scheduled visit in this office 03/22/2020.  Robley Fries, PhD [name], PhD LP Clinical Psychologist, [facility] [address], [address] [place], [place] [address] [phone]

## 2020-03-16 DIAGNOSIS — H02101 Unspecified ectropion of right upper eyelid: Secondary | ICD-10-CM | POA: Diagnosis not present

## 2020-03-16 DIAGNOSIS — Q111 Other anophthalmos: Secondary | ICD-10-CM | POA: Diagnosis not present

## 2020-03-16 DIAGNOSIS — H02102 Unspecified ectropion of right lower eyelid: Secondary | ICD-10-CM | POA: Diagnosis not present

## 2020-03-22 ENCOUNTER — Other Ambulatory Visit: Payer: Self-pay

## 2020-03-22 ENCOUNTER — Encounter: Payer: Self-pay | Admitting: Psychiatry

## 2020-03-22 ENCOUNTER — Ambulatory Visit (INDEPENDENT_AMBULATORY_CARE_PROVIDER_SITE_OTHER): Payer: BC Managed Care – PPO | Admitting: Psychiatry

## 2020-03-22 DIAGNOSIS — F330 Major depressive disorder, recurrent, mild: Secondary | ICD-10-CM

## 2020-03-22 DIAGNOSIS — F902 Attention-deficit hyperactivity disorder, combined type: Secondary | ICD-10-CM | POA: Diagnosis not present

## 2020-03-22 MED ORDER — AMPHETAMINE SULFATE 10 MG PO TABS: ORAL_TABLET | ORAL | 0 refills | Status: DC

## 2020-03-22 MED ORDER — VORTIOXETINE HBR 10 MG PO TABS: 10.0000 mg | ORAL_TABLET | Freq: Every day | ORAL | 0 refills | Status: DC

## 2020-03-22 NOTE — Progress Notes (Signed)
Mark Fritz [id number] 07/14/1985 34 y.o.  Subjective:   Patient ID:  Mark Fritz is a 34 y.o. (DOB 01/15/1986) male.  Chief Complaint:  Chief Complaint  Patient presents with   Depression   Follow-up    ADHD and anxiety    HPI Mark Fritz presents to the office today for follow-up of depression and anxiety. He reports that anxiety may be better. He reports that he has been productive and doing well at work despite several stressors.  He reports that he feels depression is "still lurking." He reports that he has noticed some negative thoughts, such as "is this ever going to end?... Are we ever going to get settled?" He reports that his energy and motivation have been ok. Sleeping well. Appetite has been fair. Concentration has been good. Denies SI.   He reports that he has had some stressful events. Just moved into a new house. He reports that he has been happy about the new house and at the same time things have been unsettled. He has a home office.   Past Psychiatric Medication Trials: Sertraline-adverse effects (initial GI side effects, insomnia, cognitive side effects, sexual side effects) Trintellix Wellbutrin- irritability Adderall-decreased appetite, increased irritability Evekeo-effective for attention deficit with out significant tolerability issues  PHQ2-9     Office Visit from 02/07/2017 in [facility]  PHQ-2 Total Score 4  PHQ-9 Total Score 15       Review of Systems:  Review of Systems  Gastrointestinal: Negative.   Musculoskeletal: Negative for gait problem.  Psychiatric/Behavioral:       Please refer to HPI    Medications: I have reviewed the patient's current medications.  Current Outpatient Medications  Medication Sig Dispense Refill   amoxicillin (AMOXIL) 500 MG capsule Take 500 mg by mouth 3 (three) times daily.     vortioxetine HBr (TRINTELLIX) 10 MG TABS tablet Take 1 tablet (10 mg total) by mouth daily. 90  tablet 0   [START ON 04/04/2020] Amphetamine Sulfate (EVEKEO) 10 MG TABS Take 10 mg by mouth every morning AND 5 mg daily at 12 noon. 45 tablet 0   [START ON 05/02/2020] Amphetamine Sulfate (EVEKEO) 10 MG TABS Take 10 mg by mouth every morning AND 5 mg daily at 12 noon. 45 tablet 0   [START ON 05/30/2020] Amphetamine Sulfate (EVEKEO) 10 MG TABS Take 10 mg by mouth every morning AND 5 mg daily after lunch. 45 tablet 0   diclofenac Sodium (VOLTAREN) 1 % GEL Apply topically 4 (four) times daily.     doxylamine, Sleep, (UNISOM) 25 MG tablet Take 12.5 mg by mouth at bedtime as needed.      Famotidine (PEPCID PO) Take by mouth daily as needed.     Melatonin 3 MG TABS Take by mouth at bedtime as needed.     meloxicam (MOBIC) 15 MG tablet Take 1 tablet (15 mg total) by mouth daily. (Patient not taking: Reported on 02/12/2020) 30 tablet 3   nitroGLYCERIN (NITRODUR - DOSED IN MG/24 HR) 0.2 mg/hr patch Apply 1/4 patch daily to tendon for tendonitis. 30 patch 1   No current facility-administered medications for this visit.    Medication Side Effects: None  Allergies: No Known Allergies  Past Medical History:  Diagnosis Date   ADHD (attention deficit hyperactivity disorder)    De Quervain's tenosynovitis, right 12/2017   Dental crowns present    and 1 implant   History of gastric ulcer    Prosthetic eye globe  right - birth defect    Family History  Problem Relation Age of Onset   Depression Mother    Diabetes Mother    Miscarriages / [place] Mother    Hearing loss Father    Hypertension Father    Mental illness Maternal Grandmother    Hypertension Paternal Grandfather    Stroke Paternal Grandfather    Depression Sister     Social History   Socioeconomic History   Marital status: Married    Spouse name: Not on file   Number of children: Not on file   Years of education: Not on file   Highest education level: Not on file  Occupational History   Not  on file  Tobacco Use   Smoking status: Never Smoker   Smokeless tobacco: Never Used  Vaping Use   Vaping Use: Never used  Substance and Sexual Activity   Alcohol use: No   Drug use: No   Sexual activity: Yes  Other Topics Concern   Not on file  Social History Narrative   Not on file   Social Determinants of Health   Financial Resource Strain:    Difficulty of Paying Living Expenses: Not on file  Food Insecurity:    Worried About Programme researcher, broadcasting/film/video in the Last Year: Not on file   [employer] of Food in the Last Year: Not on file  Transportation Needs:    Lack of Transportation (Medical): Not on file   Lack of Transportation (Non-Medical): Not on file  Physical Activity:    Days of Exercise per Week: Not on file   Minutes of Exercise per Session: Not on file  Stress:    Feeling of Stress : Not on file  Social Connections:    Frequency of Communication with Friends and Family: Not on file   Frequency of Social Gatherings with Friends and Family: Not on file   Attends Religious Services: Not on file   Active Member of Clubs or Organizations: Not on file   Attends Banker Meetings: Not on file   Marital Status: Not on file  Intimate Partner Violence:    Fear of Current or Ex-Partner: Not on file   Emotionally Abused: Not on file   Physically Abused: Not on file   Sexually Abused: Not on file    Past Medical History, Surgical history, Social history, and Family history were reviewed and updated as appropriate.   Please see review of systems for further details on the patient's review from today.   Objective:   Physical Exam:  There were no vitals taken for this visit.  Physical Exam Constitutional:      General: He is not in acute distress. Musculoskeletal:        General: No deformity.  Neurological:     Mental Status: He is alert and oriented to person, place, and time.     Coordination: Coordination normal.  Psychiatric:         Attention and Perception: Attention and perception normal. He does not perceive auditory or visual hallucinations.        Mood and Affect: Mood is not anxious. Affect is not labile, blunt, angry or inappropriate.        Speech: Speech normal.        Behavior: Behavior normal.        Thought Content: Thought content normal. Thought content is not paranoid or delusional. Thought content does not include homicidal or suicidal ideation. Thought content does not include  homicidal or suicidal plan.        Cognition and Memory: Cognition and memory normal.        Judgment: Judgment normal.     Comments: Insight intact Mood is mildly depressed     Lab Review:     Component Value Date/Time   NA 138 07/10/2017 0859   K 4.2 07/10/2017 0859   CL 101 07/10/2017 0859   CO2 30 07/10/2017 0859   GLUCOSE 107 (H) 07/10/2017 0859   BUN 18 07/10/2017 0859   CREATININE 1.04 07/10/2017 0859   CALCIUM 10.0 07/10/2017 0859   PROT 7.5 07/10/2017 0859   ALBUMIN 4.8 07/10/2017 0859   AST 21 07/10/2017 0859   ALT 30 07/10/2017 0859   ALKPHOS 49 07/10/2017 0859   BILITOT 0.5 07/10/2017 0859       Component Value Date/Time   WBC 5.4 07/10/2017 0859   RBC 5.39 07/10/2017 0859   HGB 15.6 07/10/2017 0859   HCT 45.0 07/10/2017 0859   PLT 250.0 07/10/2017 0859   MCV 83.5 07/10/2017 0859   MCHC 34.7 07/10/2017 0859   RDW 13.0 07/10/2017 0859   LYMPHSABS 2.1 07/10/2017 0859   MONOABS 0.4 07/10/2017 0859   EOSABS 0.1 07/10/2017 0859   BASOSABS 0.0 07/10/2017 0859    No results found for: POCLITH, LITHIUM   No results found for: PHENYTOIN, PHENOBARB, VALPROATE, CBMZ   .res Assessment: Plan:    Discussed potential benefits, risks, and side side effects of increasing Trintellix to 10 mg po qd. Pt agrees to increase in Trintellix to 10 mg po qd for depression.  Continue to Evekeo 10 mg po q am and 5 mg mid day for ADHD.  Pt to follow-up in 3 months or sooner if clinically indicated.  Patient  advised to contact office with any questions, adverse effects, or acute worsening in signs and symptoms.  Marjorie was seen today for depression and follow-up.  Diagnoses and all orders for this visit:  Mild episode of recurrent major depressive disorder (HCC) -     vortioxetine HBr (TRINTELLIX) 10 MG TABS tablet; Take 1 tablet (10 mg total) by mouth daily.  Attention deficit hyperactivity disorder (ADHD), combined type -     Amphetamine Sulfate (EVEKEO) 10 MG TABS; Take 10 mg by mouth every morning AND 5 mg daily at 12 noon. -     Amphetamine Sulfate (EVEKEO) 10 MG TABS; Take 10 mg by mouth every morning AND 5 mg daily at 12 noon. -     Amphetamine Sulfate (EVEKEO) 10 MG TABS; Take 10 mg by mouth every morning AND 5 mg daily after lunch.     Please see After Visit Summary for patient specific instructions.  Future Appointments  Date Time Provider Department Center  05/17/2020  8:30 AM Corie Chiquito, PMHNP [facility] None    No orders of the defined types were placed in this encounter.   -------------------------------

## 2020-04-05 DIAGNOSIS — M9901 Segmental and somatic dysfunction of cervical region: Secondary | ICD-10-CM | POA: Diagnosis not present

## 2020-04-05 DIAGNOSIS — M9902 Segmental and somatic dysfunction of thoracic region: Secondary | ICD-10-CM | POA: Diagnosis not present

## 2020-04-05 DIAGNOSIS — M9903 Segmental and somatic dysfunction of lumbar region: Secondary | ICD-10-CM | POA: Diagnosis not present

## 2020-04-05 DIAGNOSIS — M9905 Segmental and somatic dysfunction of pelvic region: Secondary | ICD-10-CM | POA: Diagnosis not present

## 2020-04-15 ENCOUNTER — Telehealth: Payer: Self-pay | Admitting: Psychiatry

## 2020-04-15 NOTE — Telephone Encounter (Signed)
Patient states he needs prior authorization on his Trintellix 10 mg. [facility], [address] [place], [place], [place] [phone]

## 2020-04-18 DIAGNOSIS — M9901 Segmental and somatic dysfunction of cervical region: Secondary | ICD-10-CM | POA: Diagnosis not present

## 2020-04-18 DIAGNOSIS — M9902 Segmental and somatic dysfunction of thoracic region: Secondary | ICD-10-CM | POA: Diagnosis not present

## 2020-04-18 DIAGNOSIS — M9905 Segmental and somatic dysfunction of pelvic region: Secondary | ICD-10-CM | POA: Diagnosis not present

## 2020-04-18 DIAGNOSIS — M9903 Segmental and somatic dysfunction of lumbar region: Secondary | ICD-10-CM | POA: Diagnosis not present

## 2020-05-17 ENCOUNTER — Telehealth: Payer: Self-pay | Admitting: Psychiatry

## 2020-05-17 ENCOUNTER — Encounter: Payer: Self-pay | Admitting: Psychiatry

## 2020-05-17 ENCOUNTER — Telehealth (INDEPENDENT_AMBULATORY_CARE_PROVIDER_SITE_OTHER): Payer: BC Managed Care – PPO | Admitting: Psychiatry

## 2020-05-17 VITALS — HR 68

## 2020-05-17 DIAGNOSIS — F3342 Major depressive disorder, recurrent, in full remission: Secondary | ICD-10-CM

## 2020-05-17 DIAGNOSIS — F902 Attention-deficit hyperactivity disorder, combined type: Secondary | ICD-10-CM

## 2020-05-17 MED ORDER — VORTIOXETINE HBR 10 MG PO TABS: 10.0000 mg | ORAL_TABLET | Freq: Every day | ORAL | 0 refills | Status: DC

## 2020-05-17 NOTE — Progress Notes (Signed)
Mark Fritz [id number] May 27, 1985 35 y.o.  Virtual Visit via Video Note  I connected with pt @ on 05/17/20 at  8:30 AM EST by a video enabled telemedicine application and verified that I am speaking with the correct person using two identifiers.   I discussed the limitations of evaluation and management by telemedicine and the availability of in person appointments. The patient expressed understanding and agreed to proceed.  I discussed the assessment and treatment plan with the patient. The patient was provided an opportunity to ask questions and all were answered. The patient agreed with the plan and demonstrated an understanding of the instructions.   The patient was advised to call back or seek an in-person evaluation if the symptoms worsen or if the condition fails to improve as anticipated.  I provided 30 minutes of non-face-to-face time during this encounter.  The patient was located at home.  The provider was located at [place].   Thayer Headings, PMHNP   Subjective:   Patient ID:  JIBRI SCHRIEFER is a 35 y.o. (DOB Sep 24, 1985) male.  Chief Complaint:  Chief Complaint  Patient presents with  . Follow-up    H/o Depression, Anxiety, and ADHD    HPI Mizael Sagar Duguay presents for follow-up of depression, anxiety, and ADHD. He reports that that he feels Trintellix is "noticeably helping." He reports that he is still trying to determine optimal timing and whether or not to take with food. He reports that nausea lasts for about 1-2 hours. He occasionally misses doses and GI s/s are more intense after missed doses. He reports that he is noticing some improvement in depressive s/s and anxiety. "Its definitely leveling me out" without sexual side effects, affective dulling, or concentration difficulties. He reports adequate concentration. He reports that he had some frustration with work and then met with leadership about his frustrations. He reports that he is "find it easier  to let things be the way they are going to be."  Energy and motivation have been good. Sleep has been ok. He reports that he may notice some increase in sleepiness at the end of the day. Appetite has been slightly increased and is trying to increase protein and fiber. Denies any recent negative and hopeless thoughts. Denies SI.   He reports that they are continuing to get settled in their new home. He is now working from home.    Past Psychiatric Medication Trials: Sertraline-adverse effects(initial GI side effects, insomnia, cognitive side effects, sexual side effects) Trintellix Wellbutrin- irritability Adderall-decreased appetite, increased irritability Evekeo-effective for attention deficit with out significant tolerability issues  Review of Systems:  Review of Systems  Cardiovascular: Negative for palpitations.  Gastrointestinal:       Mild nausea at times after taking Trintellix  Musculoskeletal: Negative for gait problem.  Neurological: Negative for tremors.  Psychiatric/Behavioral:       Please refer to HPI    Medications: I have reviewed the patient's current medications.  Current Outpatient Medications  Medication Sig Dispense Refill  . Amphetamine Sulfate (EVEKEO) 10 MG TABS Take 10 mg by mouth every morning AND 5 mg daily at 12 noon. 45 tablet 0  . doxylamine, Sleep, (UNISOM) 25 MG tablet Take 12.5 mg by mouth at bedtime as needed.     . Famotidine (PEPCID PO) Take by mouth daily as needed.    . Amphetamine Sulfate (EVEKEO) 10 MG TABS Take 10 mg by mouth every morning AND 5 mg daily at 12 noon. 45 tablet 0  . [  START ON 05/30/2020] Amphetamine Sulfate (EVEKEO) 10 MG TABS Take 10 mg by mouth every morning AND 5 mg daily after lunch. 45 tablet 0  . diclofenac Sodium (VOLTAREN) 1 % GEL Apply topically 4 (four) times daily. (Patient not taking: Reported on 05/17/2020)    . nitroGLYCERIN (NITRODUR - DOSED IN MG/24 HR) 0.2 mg/hr patch Apply 1/4 patch daily to tendon for  tendonitis. 30 patch 1  . vortioxetine HBr (TRINTELLIX) 10 MG TABS tablet Take 1 tablet (10 mg total) by mouth daily. 90 tablet 0   No current facility-administered medications for this visit.    Medication Side Effects: Nausea  He reports that if he takes Trintellix in combination with Evekeo 10 mg.   Allergies: No Known Allergies  Past Medical History:  Diagnosis Date  . ADHD (attention deficit hyperactivity disorder)   . De Quervain's tenosynovitis, right 12/2017  . Dental crowns present    and 1 implant  . History of gastric ulcer   . 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 Depression Mother   . Diabetes Mother   . Miscarriages / [place] Mother   . Hearing loss Father   . Hypertension Father   . Mental illness Maternal Grandmother   . Hypertension Paternal Grandfather   . Stroke Paternal Grandfather   . Depression Sister     Social History   Socioeconomic History  . Marital status: Married    Spouse name: Not on file  . Number of children: Not on file  . Years of education: Not on file  . Highest education level: Not on file  Occupational History  . Not on file  Tobacco Use  . Smoking status: Never Smoker  . Smokeless tobacco: Never Used  Vaping Use  . Vaping Use: Never used  Substance and Sexual Activity  . Alcohol use: No  . Drug use: No  . Sexual activity: Yes  Other Topics Concern  . Not on file  Social History Narrative  . Not on file   Social Determinants of Health   Financial Resource Strain: Not on file  Food Insecurity: Not on file  Transportation Needs: Not on file  Physical Activity: Not on file  Stress: Not on file  Social Connections: Not on file  Intimate Partner Violence: Not on file    Past Medical History, Surgical history, Social history, and Family history were reviewed and updated as appropriate.   Please see review of systems for further details on the patient's  review from today.   Objective:   Physical Exam:  Pulse 68   Physical Exam Neurological:     Mental Status: He is alert and oriented to person, place, and time.     Cranial Nerves: No dysarthria.  Psychiatric:        Attention and Perception: Attention and perception normal.        Mood and Affect: Mood normal.        Speech: Speech normal.        Behavior: Behavior is cooperative.        Thought Content: Thought content normal. Thought content is not paranoid or delusional. Thought content does not include homicidal or suicidal ideation. Thought content does not include homicidal or suicidal plan.        Cognition and Memory: Cognition and memory normal.        Judgment: Judgment normal.     Comments: Insight  intact     Lab Review:     Component Value Date/Time   NA 138 07/10/2017 0859   K 4.2 07/10/2017 0859   CL 101 07/10/2017 0859   CO2 30 07/10/2017 0859   GLUCOSE 107 (H) 07/10/2017 0859   BUN 18 07/10/2017 0859   CREATININE 1.04 07/10/2017 0859   CALCIUM 10.0 07/10/2017 0859   PROT 7.5 07/10/2017 0859   ALBUMIN 4.8 07/10/2017 0859   AST 21 07/10/2017 0859   ALT 30 07/10/2017 0859   ALKPHOS 49 07/10/2017 0859   BILITOT 0.5 07/10/2017 0859       Component Value Date/Time   WBC 5.4 07/10/2017 0859   RBC 5.39 07/10/2017 0859   HGB 15.6 07/10/2017 0859   HCT 45.0 07/10/2017 0859   PLT 250.0 07/10/2017 0859   MCV 83.5 07/10/2017 0859   MCHC 34.7 07/10/2017 0859   RDW 13.0 07/10/2017 0859   LYMPHSABS 2.1 07/10/2017 0859   MONOABS 0.4 07/10/2017 0859   EOSABS 0.1 07/10/2017 0859   BASOSABS 0.0 07/10/2017 0859    No results found for: POCLITH, LITHIUM   No results found for: PHENYTOIN, PHENOBARB, VALPROATE, CBMZ   .res Assessment: Plan:    Continue Trintellix 10 mg po qd for depression. Discussed strategies to try to minimize GI side effects. He reports that benefits are currently outweighing side effects. Continue Evekeo 10 mg po q am and 5 mg q noon  for ADHD.  Pt to follow-up in 3 months or sooner if clinically indicated. Patient advised to contact office with any questions, adverse effects, or acute worsening in signs and symptoms.   Arnet was seen today for follow-up.  Diagnoses and all orders for this visit:  Recurrent major depressive disorder, in full remission ([place]) -     vortioxetine HBr (TRINTELLIX) 10 MG TABS tablet; Take 1 tablet (10 mg total) by mouth daily.  Attention deficit hyperactivity disorder (ADHD), combined type     Please see After Visit Summary for patient specific instructions.  No future appointments.  No orders of the defined types were placed in this encounter.     -------------------------------

## 2020-05-17 NOTE — Telephone Encounter (Signed)
[name], [place] are scheduled for a virtual visit with your provider today.    Just as we do with appointments in the office, we must obtain your consent to participate.  Your consent will be active for this visit and any virtual visit you may have with one of our providers in the next 365 days.    If you have a MyChart account, I can also send a copy of this consent to you electronically.  All virtual visits are billed to your insurance company just like a traditional visit in the office.  As this is a virtual visit, video technology does not allow for your provider to perform a traditional examination.  This may limit your provider's ability to fully assess your condition.  If your provider identifies any concerns that need to be evaluated in person or the need to arrange testing such as labs, EKG, etc, we will make arrangements to do so.    Although advances in technology are sophisticated, we cannot ensure that it will always work on either your end or our end.  If the connection with a video visit is poor, we may have to switch to a telephone visit.  With either a video or telephone visit, we are not always able to ensure that we have a secure connection.   I need to obtain your verbal consent now.   Are you willing to proceed with your visit today?   Mark Fritz has provided verbal consent on 05/17/2020 for a virtual visit (video or telephone).   Corie Chiquito, PMHNP 05/17/2020  8:34 AM

## 2020-06-29 DIAGNOSIS — M9905 Segmental and somatic dysfunction of pelvic region: Secondary | ICD-10-CM | POA: Diagnosis not present

## 2020-06-29 DIAGNOSIS — M9903 Segmental and somatic dysfunction of lumbar region: Secondary | ICD-10-CM | POA: Diagnosis not present

## 2020-06-29 DIAGNOSIS — M9902 Segmental and somatic dysfunction of thoracic region: Secondary | ICD-10-CM | POA: Diagnosis not present

## 2020-06-29 DIAGNOSIS — M9901 Segmental and somatic dysfunction of cervical region: Secondary | ICD-10-CM | POA: Diagnosis not present

## 2020-07-18 DIAGNOSIS — M9901 Segmental and somatic dysfunction of cervical region: Secondary | ICD-10-CM | POA: Diagnosis not present

## 2020-07-18 DIAGNOSIS — M9903 Segmental and somatic dysfunction of lumbar region: Secondary | ICD-10-CM | POA: Diagnosis not present

## 2020-07-18 DIAGNOSIS — M9905 Segmental and somatic dysfunction of pelvic region: Secondary | ICD-10-CM | POA: Diagnosis not present

## 2020-07-18 DIAGNOSIS — M9902 Segmental and somatic dysfunction of thoracic region: Secondary | ICD-10-CM | POA: Diagnosis not present

## 2020-07-27 ENCOUNTER — Ambulatory Visit (INDEPENDENT_AMBULATORY_CARE_PROVIDER_SITE_OTHER): Payer: BC Managed Care – PPO | Admitting: Psychiatry

## 2020-07-27 ENCOUNTER — Other Ambulatory Visit: Payer: Self-pay

## 2020-07-27 DIAGNOSIS — F411 Generalized anxiety disorder: Secondary | ICD-10-CM

## 2020-07-27 DIAGNOSIS — Z636 Dependent relative needing care at home: Secondary | ICD-10-CM | POA: Diagnosis not present

## 2020-07-27 DIAGNOSIS — F331 Major depressive disorder, recurrent, moderate: Secondary | ICD-10-CM

## 2020-07-27 NOTE — Progress Notes (Signed)
Psychotherapy Progress Note [facility] [name], PhD LP  Patient ID: Mark Fritz     MRN: [id number] Therapy format: Individual psychotherapy Date: 07/27/2020      Start: 5:07p     Stop: 5:54p     Time Spent: 47 min Location: In-person   Session narrative (presenting needs, interim history, self-report of stressors and symptoms, applications of prior therapy, status changes, and interventions made in session) Back now after 5 months back in depression, it's wearing on the family.  Can have harsh turns of irritability or moroseness.  Has wondered about bipolar d/o but no complaints of being overmotivated or grandiose.  More restless, urgent lately, but it's more constant to ruminate on helplessness, feeling stuck, dead-end career.  Trintellix initially seemed helpful, as last reported January, but a month ago he went off, feeling it had become dulling instead.  Notes he has also felt somewhat oversexed, perhaps, despite depressive signs.  Admits some use of pornography to satisfy, ostensibly in light of some relationship dissatisfaction, may be a non-"cheating" way to channel anger and sexual urges, but obviously not condoned by his faith.  Abstaining at this point, understands it can be addictive as well as avoidant or otherwise unfaithful.  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  Did broach complaints with his boss about double messages, comically delayed "goals", and really just making him feel like he's an object, not a Clinical research associate.  Has noticed that he cannot summon himself for anything that he doesn't fundamentally feel is important.  Another issue is that his male boss (the general counsel), like his wife, generates a tyranny of unimportant concerns, both seeming to be dominated by OCD themselves.  SI has flared up the last few weeks, actually, with developed, intrusive thoughts of how to  accomplish it.  Cautioned about letting those thoughts roam, agreed he is capable of resisting but that it argues for being back on medication.  Was seeking alternative job with [employer], became final two, just beat out by another applicant, which made him feel downrated.  Realizes that is catastrophizing, most likely rooted in performance expectations and prior history of depressive episodes.  Reframed, encouraged in resuming medication.  Offered possibility that he has latent bipolar tendencies, but he has also had inflammatory issues before, and better control of inflammatory response may turn out to be helpful for mood regulation as well.  In any event, lithium as an augmentation could well help.  Therapeutic modalities: Cognitive Behavioral Therapy, Solution-Oriented/Positive Psychology, Environmental manager, and Faith-sensitive  Mental Status/Observations:  Appearance:   Casual and Neat     Behavior:  Appropriate  Motor:  Normal  Speech/Language:   Clear and Coherent  Affect:  Appropriate  Mood:  depressed and irritable  Thought process:  normal  Thought content:    WNL  Sensory/Perceptual disturbances:    WNL  Orientation:  Fully oriented  Attention:  Good    Concentration:  Good  Memory:  WNL  Insight:    Good  Judgment:   Good  Impulse Control:  Good   Risk Assessment: Danger to Self:  SI without intent but detailed ideas  Self-injurious Behavior: No Danger to Others: No Physical Aggression / Violence: No Duty to Warn: No Access to Firearms a concern: No  Assessment of progress:  situational setback(s)  Diagnosis:   ICD-10-CM   1. Major depressive disorder, recurrent episode, moderate (HCC)  F33.1  2. Generalized anxiety disorder  F41.1     3. Caregiver stress  Z63.6      Plan:  Renew pledge not to suicide Recommend omega-3 supplementation for antiinflammatory action Consider lithium, possibly low dose -- see Ms. Montez Morita -- as antidepressant booster and/or mood  stabilizer for unrecognized cyclic mood disorder Possible role serving in a volunteer capacity, if looking for meaning outside occupation, and if family life can make room Physically expressive outlet -- running can help, weights can help, punching bag may be more satisfying Some form of "me" time -- golf is helping so far Caution if he sees porn use coming back Other recommendations/advice as may be noted above Continue to utilize previously learned skills ad lib Maintain medication as prescribed and work faithfully with relevant prescriber(s) if any changes are desired or seem indicated Call the clinic on-call service, present to ER, or call 911 if any life-threatening psychiatric crisis Return 1-2 wks recommended. Already scheduled visit in this office 08/09/2020.  Robley Fries, PhD [name], PhD LP Clinical Psychologist, [facility] [address], [address] [place], [place] [address] [phone]

## 2020-08-01 ENCOUNTER — Telehealth: Payer: Self-pay | Admitting: Psychiatry

## 2020-08-01 DIAGNOSIS — F3342 Major depressive disorder, recurrent, in full remission: Secondary | ICD-10-CM

## 2020-08-01 MED ORDER — LITHIUM CARBONATE 150 MG PO CAPS: ORAL_CAPSULE | ORAL | 1 refills | Status: DC

## 2020-08-01 NOTE — Telephone Encounter (Signed)
Do I need to call and get more info?

## 2020-08-01 NOTE — Telephone Encounter (Signed)
Reviewed

## 2020-08-01 NOTE — Telephone Encounter (Signed)
Returned call to pt. He reports that he has had severe depression for the last 3 months or so. He reports that he stopped Trintellix about 5 weeks ago since it did not seem to be helpful and was causing him to feel "fuzzy headed."  He reports that depression over the weekend was "almost unbearable" after he left Evekeo at the office. He reports that suicidal thoughts changed from intrusive thoughts to thinking more about "wanting it." He reports "hot or cold moods... switching back and forth before anger and despair."  He reports that SI improved after taking Evekeo today when he returned to work. He reports that he had SI on the way to work.   Discussed potential benefits, risks, and side effects of Lithium. Discussed that Lithium may help decrease suicidal thoughts and is used off-label for depression without Bipolar D/O. Pt agrees to trial of Lithium. Will start Lithium 150 mg po QHS for 3-5 days, then increase to 300 mg po QHS. Pt to follow-up on 08/04/20. Patient advised to contact office with any questions, adverse effects, or acute worsening in signs and symptoms.

## 2020-08-01 NOTE — Telephone Encounter (Signed)
Mark Fritz called in stating that his last appt with AM they discussed starting him on Lithium he hasn't been taking the Trintellix because it makes him fuzzy headed. He has been having a really bad episode for the last four or five months with his depression. He also states increase suicidal ideation thoughts. He made appt for 4/7. He would like a CB ASAP. [phone] is the best number to reach him.

## 2020-08-01 NOTE — Telephone Encounter (Signed)
Attempted to reach pt. No answer. Left message that provider was returning his call and will try reaching him again later.

## 2020-08-02 ENCOUNTER — Encounter: Payer: Self-pay | Admitting: Psychiatry

## 2020-08-02 ENCOUNTER — Telehealth (INDEPENDENT_AMBULATORY_CARE_PROVIDER_SITE_OTHER): Payer: BC Managed Care – PPO | Admitting: Psychiatry

## 2020-08-02 DIAGNOSIS — F411 Generalized anxiety disorder: Secondary | ICD-10-CM | POA: Diagnosis not present

## 2020-08-02 DIAGNOSIS — F902 Attention-deficit hyperactivity disorder, combined type: Secondary | ICD-10-CM

## 2020-08-02 DIAGNOSIS — F339 Major depressive disorder, recurrent, unspecified: Secondary | ICD-10-CM

## 2020-08-02 MED ORDER — AMPHETAMINE SULFATE 10 MG PO TABS: ORAL_TABLET | ORAL | 0 refills | Status: DC

## 2020-08-02 NOTE — Progress Notes (Signed)
Mark Fritz [id number] [birth year] 35 y.o.  Virtual Visit via Video Note  I connected with pt @ on 08/02/20 at  1:00 PM EDT by a video enabled telemedicine application and verified that I am speaking with the correct person using two identifiers.   I discussed the limitations of evaluation and management by telemedicine and the availability of in person appointments. The patient expressed understanding and agreed to proceed.  I discussed the assessment and treatment plan with the patient. The patient was provided an opportunity to ask questions and all were answered. The patient agreed with the plan and demonstrated an understanding of the instructions.   The patient was advised to call back or seek an in-person evaluation if the symptoms worsen or if the condition fails to improve as anticipated.  I provided 30 minutes of non-face-to-face time during this encounter.  The patient was located at home.  The provider was located at [facility].   Corie Chiquito, PMHNP   Subjective:   Patient ID:  Mark Fritz is a 35 y.o. (DOB [birth year]) male.  Chief Complaint:  Chief Complaint  Patient presents with  . Depression  . Anxiety    HPI Mark Fritz presents for follow-up of depression, anxiety, and ADD. He reports that Monday morning was "a low point." He reports that current depressive episode has been severe.  He started Lithium last night and noticed some dizziness last night when he awakened during the middle of the night. He denies any side effects this morning. He reports that he had difficulty sleeping last night and this could be due to several factors. He reports occasional irritability. He reports that sadness today is "ok." He reports some sadness in response to feeling that he is not contributing, using his talents to the fullest extent possible, and not feeling as if he is fully meeting the emotional needs of his family. He reports that he has had suicidal ideation  for weeks. He denies SI today.   He reports that he is trying to eat healthier foods. He reports that he has periods of anxiety, typically in response to work stressors. He reports that he has difficulty at times deciding what to start first.   He reports that he has had work stress. He reports that increased depression seems to have occurred after some conversations with supervisors.   He has been playing golf with some coworkers. He reports that this has been helpful socially.   He reports that his daughter has been worried about him. His oldest son is autistic and youngest son may exhibit some characteristics of autism. He reports that his wife has been having health problems and OCD.   Past Psychiatric Medication Trials: Sertraline-adverse effects(initial GI side effects, insomnia, cognitive side effects, sexual side effects) Trintellix- Not effective and caused some "fuzzy head" Wellbutrin- irritability Adderall-decreased appetite, increased irritability Evekeo-effective for attention deficit with out significant tolerability issues Lithium  Review of Systems:  Review of Systems  Constitutional: Positive for fatigue.  Musculoskeletal: Negative for gait problem.  Psychiatric/Behavioral:       Please refer to HPI    Medications: I have reviewed the patient's current medications.  Current Outpatient Medications  Medication Sig Dispense Refill  . Amphetamine Sulfate (EVEKEO) 10 MG TABS Take 10 mg by mouth every morning AND 5 mg daily after lunch. 45 tablet 0  . [START ON 08/30/2020] Amphetamine Sulfate (EVEKEO) 10 MG TABS Take 10 mg by mouth every morning AND 5 mg daily at  12 noon. 45 tablet 0  . Amphetamine Sulfate (EVEKEO) 10 MG TABS Take 10 mg by mouth every morning AND 5 mg daily at 12 noon. 45 tablet 0  . diclofenac Sodium (VOLTAREN) 1 % GEL Apply topically 4 (four) times daily. (Patient not taking: Reported on 05/17/2020)    . doxylamine, Sleep, (UNISOM) 25 MG tablet Take  12.5 mg by mouth at bedtime as needed.     . Famotidine (PEPCID PO) Take by mouth daily as needed.    . lithium carbonate 150 MG capsule Take 1 capsule po QHS x 3-5 days, then increase to 2 capsules po QHS 60 capsule 1  . nitroGLYCERIN (NITRODUR - DOSED IN MG/24 HR) 0.2 mg/hr patch Apply 1/4 patch daily to tendon for tendonitis. 30 patch 1   No current facility-administered medications for this visit.    Medication Side Effects: None  Allergies: No Known Allergies  Past Medical History:  Diagnosis Date  . ADHD (attention deficit hyperactivity disorder)   . De Quervain's tenosynovitis, right 12/2017  . Dental crowns present    and 1 implant  . History of gastric ulcer   . 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 Depression Mother   . Diabetes Mother   . Miscarriages / [place] Mother   . Hearing loss Father   . Hypertension Father   . Mental illness Maternal Grandmother   . Hypertension Paternal Grandfather   . Stroke Paternal Grandfather   . Depression Sister     Social History   Socioeconomic History  . Marital status: Married    Spouse name: Not on file  . Number of children: Not on file  . Years of education: Not on file  . Highest education level: Not on file  Occupational History  . Not on file  Tobacco Use  . Smoking status: Never Smoker  . Smokeless tobacco: Never Used  Vaping Use  . Vaping Use: Never used  Substance and Sexual Activity  . Alcohol use: No  . Drug use: No  . Sexual activity: Yes  Other Topics Concern  . Not on file  Social History Narrative  . Not on file   Social Determinants of Health   Financial Resource Strain: Not on file  Food Insecurity: Not on file  Transportation Needs: Not on file  Physical Activity: Not on file  Stress: Not on file  Social Connections: Not on file  Intimate Partner Violence: Not on file    Past Medical History, Surgical history, Social  history, and Family history were reviewed and updated as appropriate.   Please see review of systems for further details on the patient's review from today.   Objective:   Physical Exam:  There were no vitals taken for this visit.  Physical Exam Neurological:     Mental Status: He is alert and oriented to person, place, and time.     Cranial Nerves: No dysarthria.  Psychiatric:        Attention and Perception: Attention and perception normal.        Mood and Affect: Mood is anxious and depressed.        Speech: Speech normal.        Behavior: Behavior is cooperative.        Thought Content: Thought content normal. Thought content is not paranoid or delusional. Thought content does not include homicidal or suicidal ideation. Thought content does not  include homicidal or suicidal plan.        Cognition and Memory: Cognition and memory normal.        Judgment: Judgment normal.     Comments: Insight intact     Lab Review:     Component Value Date/Time   NA 138 07/10/2017 0859   K 4.2 07/10/2017 0859   CL 101 07/10/2017 0859   CO2 30 07/10/2017 0859   GLUCOSE 107 (H) 07/10/2017 0859   BUN 18 07/10/2017 0859   CREATININE 1.04 07/10/2017 0859   CALCIUM 10.0 07/10/2017 0859   PROT 7.5 07/10/2017 0859   ALBUMIN 4.8 07/10/2017 0859   AST 21 07/10/2017 0859   ALT 30 07/10/2017 0859   ALKPHOS 49 07/10/2017 0859   BILITOT 0.5 07/10/2017 0859       Component Value Date/Time   WBC 5.4 07/10/2017 0859   RBC 5.39 07/10/2017 0859   HGB 15.6 07/10/2017 0859   HCT 45.0 07/10/2017 0859   PLT 250.0 07/10/2017 0859   MCV 83.5 07/10/2017 0859   MCHC 34.7 07/10/2017 0859   RDW 13.0 07/10/2017 0859   LYMPHSABS 2.1 07/10/2017 0859   MONOABS 0.4 07/10/2017 0859   EOSABS 0.1 07/10/2017 0859   BASOSABS 0.0 07/10/2017 0859    No results found for: POCLITH, LITHIUM   No results found for: PHENYTOIN, PHENOBARB, VALPROATE, CBMZ   .res Assessment: Plan:    Discussed continuing lithium  since patient reports that he has tolerated initial dose of lithium and that mood is somewhat improved.  Patient to increase lithium to 300 mg at bedtime and several days, based upon tolerability, to help improve depression and suicidal thoughts. Continue Evekeo for ADHD.  Agree with continuing psychotherapy with Dr. Farrel Demark. Patient to follow-up with this provider in 4 weeks or sooner if clinically indicated. Patient advised to contact office with any questions, adverse effects, or acute worsening in signs and symptoms.   Mark Fritz was seen today for depression and anxiety.  Diagnoses and all orders for this visit:  Generalized anxiety disorder  Attention deficit hyperactivity disorder (ADHD), combined type -     Amphetamine Sulfate (EVEKEO) 10 MG TABS; Take 10 mg by mouth every morning AND 5 mg daily at 12 noon. -     Amphetamine Sulfate (EVEKEO) 10 MG TABS; Take 10 mg by mouth every morning AND 5 mg daily at 12 noon.  Major depression, recurrent, chronic (HCC)     Please see After Visit Summary for patient specific instructions.  Future Appointments  Date Time Provider Department Center  08/09/2020  8:00 AM Mark Fries, PhD [facility] None  08/25/2020  1:00 PM Mark Fries, PhD [facility] None  08/30/2020  3:15 PM Corie Chiquito, PMHNP [facility] None  09/06/2020  8:00 AM Mark Fries, PhD [facility] None    No orders of the defined types were placed in this encounter.     -------------------------------

## 2020-08-04 ENCOUNTER — Ambulatory Visit: Payer: BC Managed Care – PPO | Admitting: Psychiatry

## 2020-08-08 DIAGNOSIS — M9901 Segmental and somatic dysfunction of cervical region: Secondary | ICD-10-CM | POA: Diagnosis not present

## 2020-08-08 DIAGNOSIS — M9903 Segmental and somatic dysfunction of lumbar region: Secondary | ICD-10-CM | POA: Diagnosis not present

## 2020-08-08 DIAGNOSIS — M9902 Segmental and somatic dysfunction of thoracic region: Secondary | ICD-10-CM | POA: Diagnosis not present

## 2020-08-08 DIAGNOSIS — M9905 Segmental and somatic dysfunction of pelvic region: Secondary | ICD-10-CM | POA: Diagnosis not present

## 2020-08-09 ENCOUNTER — Other Ambulatory Visit: Payer: Self-pay

## 2020-08-09 ENCOUNTER — Ambulatory Visit (INDEPENDENT_AMBULATORY_CARE_PROVIDER_SITE_OTHER): Payer: BC Managed Care – PPO | Admitting: Psychiatry

## 2020-08-09 DIAGNOSIS — F339 Major depressive disorder, recurrent, unspecified: Secondary | ICD-10-CM

## 2020-08-09 DIAGNOSIS — Z636 Dependent relative needing care at home: Secondary | ICD-10-CM | POA: Diagnosis not present

## 2020-08-09 DIAGNOSIS — F900 Attention-deficit hyperactivity disorder, predominantly inattentive type: Secondary | ICD-10-CM

## 2020-08-09 DIAGNOSIS — F411 Generalized anxiety disorder: Secondary | ICD-10-CM | POA: Diagnosis not present

## 2020-08-09 NOTE — Progress Notes (Signed)
Psychotherapy Progress Note [facility] [name], PhD LP  Patient ID: Mark Fritz     MRN: [id number] Therapy format: Individual psychotherapy Date: 08/09/2020      Start: 8:12a     Stop: 8:59a     Time Spent: 47 min Location: In-person   Session narrative (presenting needs, interim history, self-report of stressors and symptoms, applications of prior therapy, status changes, and interventions made in session) Begin lithium as recommended, 8 days ago.  No noticeable effects as yet except a little harder to sleep and persistent dry mouth he thinks might be attributable to allergies or lithium.  Feels a good working connection with a psychiatrist and is willing to ask questions of her as needed.  Thoughts still race, especially at night, especially connected to job pressures.  Feels the pressure has increased lately, feels more that he is in danger of being fired, having gotten annual review feedback that he puts less effort into things he deems less important, a point this seems to indicate that his direct superiors feel shortchanged by his efforts on assigned projects, as opposed to work done and collegial service to others outside of his direct report.  Realizes that he does serve everyone well in his organization accept his bosses, whom he sees as particularly unfair, unyielding, and unrealistic in their handling of management issues.  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  Credibly perceives others as feeling over managed and over tasked, not just himself.  Knows he has been to the brink of mouthing off with his superiors, in ways that surely would risk disciplinary action or firing, and just last night was burning late oil on a contract that was overdue and required some talking down from his  wife to keep from expressing himself unwisely.  Realistically, figures his bosses are "done" with explanations and excuses he may give, and he is entertaining the need to just give up trying to teach fairness and civility as he sees it, though he tends to frame it as if it were the loss of his principles to do so.  For the first time yesterday, shared that the scrutiny he receives of late actually is making it take longer for him to complete tasks because it drives him to second-guess more.  Contrary to fearful fantasies, he was not disciplined for saying so.  Denies any discernible effect with his boss, but suggested he keep faith -- good points can still get listened to without an ego-bound listener tipping his hand.  Addressed the potential of serving both sides of his nature, the practical and the ideal and how he might carry on with the job.  Affirmed the inherent value in maintaining his employment for the sake of his family, whom he loves quite clearly, and in fact the moral imperative of providing for the people whom God has given him, according to his faith, regardless of circumstances.  Validated that he does not have to get results "teaching" those around him who may have adopted a much more worldly perspective than he and that his example is his example, no matter who is watching, and no matter who takes it seriously.  Compared to the parable of the sower in Jesus's teachings, making the point that the one who sows seed is not responsible for whether it  sprouts or even gets rained on.  Moreover, affirmed that it is good faith with his values if he puts a similar effort into projects assigned by his superiors as he does with others, and foregoes any need to personally frustrate or punish bad behavior in his superiors, that it is spiritual and psychological victory not to let his resentments make his decisions where he puts his effort in, and even that it may be especially praiseworthy in God's sight to  be fair even to the unfair.  On an existential level, it is perhaps perfect autonomy to do so, and perhaps most true that his depression of late, including fleeting suicidal thoughts, has the most to do with the cognitive dissonance he has found himself in for not treating people equally and knowing, on some level, that he has allowed resentment to make that decision for him.  Says he finds it very helpful to look at things that way.  Resolved to let "the blind" take the lessons as they will, and put his own efforts jointly into fair effort to all concerned, taking care of his own reserves for energy, making short work of resentment, being himself as much as possible in all things, and assertiveness with the organization by remaining willing to ask his superiors for clarification.  Looking forward to family vacation later this week as well.  Therapeutic modalities: Cognitive Behavioral Therapy, Solution-Oriented/Positive Psychology, Ego-Supportive, Humanistic/Existential, Faith-sensitive and Assertiveness/Communication  Mental Status/Observations:  Appearance:   Casual     Behavior:  Appropriate  Motor:  Normal  Speech/Language:   Clear and Coherent  Affect:  Appropriate and restrained tears at points  Mood:  angry and depressed, responsive  Thought process:  normal  Thought content:    WNL  Sensory/Perceptual disturbances:    WNL  Orientation:  Fully oriented  Attention:  Good    Concentration:  Good  Memory:  WNL  Insight:    Good  Judgment:   Good  Impulse Control:  Good   Risk Assessment: Danger to Self: some intrusive SI w/o intent or plan Self-injurious Behavior: No Danger to Others: No Physical Aggression / Violence: No Duty to Warn: No Access to Firearms a concern: No  Assessment of progress:  progressing  Diagnosis:   ICD-10-CM   1. Major depression, recurrent, chronic (HCC)  F33.9   2. Generalized anxiety disorder  F41.1   3. Caregiver stress  Z63.6    Plan:   . Resolved to let "the blind" take the lessons as they will, and put his own efforts jointly into o fair effort to all concerned, not decided by judgments of who acts fairly/unfairly toward him o taking care of his own reserves for energy o catching and releasing resentment o being himself as much as possible in all things, and  o practicing assertiveness with the organization by reflecting back dilemmas and remaining willing to ask clarification . As indicated by prescriber, give lithium some more time to settle in and be more effective, but feel free to be in touch with prescriber with questions and guidance . Other recommendations/advice as may be noted above . Continue to utilize previously learned skills ad lib . Maintain medication as prescribed and work faithfully with relevant prescriber(s) if any changes are desired or seem indicated . Call the clinic on-call service, present to ER, or call 911 if any life-threatening psychiatric crisis Return for session(s) already scheduled. . Already scheduled visit in this office 08/25/2020.  Robley Fries, PhD [name], PhD  LP Clinical Psychologist, [facility] [address], [place],  [address] [phone]

## 2020-08-22 DIAGNOSIS — M9905 Segmental and somatic dysfunction of pelvic region: Secondary | ICD-10-CM | POA: Diagnosis not present

## 2020-08-22 DIAGNOSIS — M9903 Segmental and somatic dysfunction of lumbar region: Secondary | ICD-10-CM | POA: Diagnosis not present

## 2020-08-22 DIAGNOSIS — M9902 Segmental and somatic dysfunction of thoracic region: Secondary | ICD-10-CM | POA: Diagnosis not present

## 2020-08-22 DIAGNOSIS — M9901 Segmental and somatic dysfunction of cervical region: Secondary | ICD-10-CM | POA: Diagnosis not present

## 2020-08-23 ENCOUNTER — Other Ambulatory Visit: Payer: Self-pay | Admitting: Psychiatry

## 2020-08-23 DIAGNOSIS — F3342 Major depressive disorder, recurrent, in full remission: Secondary | ICD-10-CM

## 2020-08-25 ENCOUNTER — Other Ambulatory Visit: Payer: Self-pay

## 2020-08-25 ENCOUNTER — Ambulatory Visit (INDEPENDENT_AMBULATORY_CARE_PROVIDER_SITE_OTHER): Payer: BC Managed Care – PPO | Admitting: Psychiatry

## 2020-08-25 DIAGNOSIS — M9902 Segmental and somatic dysfunction of thoracic region: Secondary | ICD-10-CM | POA: Diagnosis not present

## 2020-08-25 DIAGNOSIS — F411 Generalized anxiety disorder: Secondary | ICD-10-CM | POA: Diagnosis not present

## 2020-08-25 DIAGNOSIS — Z636 Dependent relative needing care at home: Secondary | ICD-10-CM | POA: Diagnosis not present

## 2020-08-25 DIAGNOSIS — Z630 Problems in relationship with spouse or partner: Secondary | ICD-10-CM | POA: Diagnosis not present

## 2020-08-25 DIAGNOSIS — F339 Major depressive disorder, recurrent, unspecified: Secondary | ICD-10-CM | POA: Diagnosis not present

## 2020-08-25 DIAGNOSIS — M9905 Segmental and somatic dysfunction of pelvic region: Secondary | ICD-10-CM | POA: Diagnosis not present

## 2020-08-25 DIAGNOSIS — M9901 Segmental and somatic dysfunction of cervical region: Secondary | ICD-10-CM | POA: Diagnosis not present

## 2020-08-25 DIAGNOSIS — M9903 Segmental and somatic dysfunction of lumbar region: Secondary | ICD-10-CM | POA: Diagnosis not present

## 2020-08-25 NOTE — Progress Notes (Signed)
Psychotherapy Progress Note [facility] Mark Czar, PhD LP  Patient ID: Mark Fritz     MRN: [id number] Therapy format: Individual psychotherapy Date: 08/25/2020      Start: 1:07p     Stop: 1:56p     Time Spent: 49 min Location: In-person   Session narrative (presenting needs, interim history, self-report of stressors and symptoms, applications of prior therapy, status changes, and interventions made in session) "Not awesome."  Staying on top of organization and responsibilities better.  Helps to have one task transferred to a new colleague, restored some faith that he won't just be dumped on.  Been very irritable with others sloughing, not noticing faithful communication on his part, including a whole group of people already copied on a board minutes email asking if they could get the board minutes.  Feels singled out for unforgiveness for things everyone else gets away with.  Discouraged to find his inquiry about switching departments went to   Discouraging at home for wife to be compulsively reciting her needs when he is suffering most.  She has been obsessing about whether he will kill himself, and checking on him over small deviations in behavior, which only irritates further and makes him feel all the more responsible to manage her distress for her -- a role he is already past weary of after years of accommodating her OCD (and sometimes, to his chagrin, reacting angrily to).  Finds himself disgusted by his son's autistic behavior sometimes, too, something which becomes a point of disgust with himself.  And still annoyed being pestered by [facility] about what's the right choice to make about household needs.  Family beach trip he hoped would decompress turned out to be much more work and responsibility, plus emergency medical attention for son.  Support/empathy provided.   Discussed true needs and support options.  One idea is to get buddy time with Mark Fritz, maybe playing golf.   Mark Fritz is coping with tragedy himself and may really want/need the time himself to go do something normal and uncomplicated rater than soak in what hurts him.  At home, advocated changing his approach engaging Mark Fritz, namely letting her know it drains him to be checked on, even if well intentioned, and he can -- if realistic -- ask her to practice trust that he will let her know if he is cracking and needs help preventing danger.  Encouraged frame what he would want her to do instead of only asking her to refrain from excessive/annoying things, because it is more proactive, memorable, and collaborative.  Also discussed scheduling times to deal with things she wants reassurance about, so the experience can at least be more predictable, rather than potentially annoying any time, and in so doing get her to do some productive work holding her own impulses, which would help him feel less alone as the responsible adult.  For safety concerns, would also help to educate Mark Fritz about what to look for, how to recognize, to trust for his safety so she can also have more predictability in what she worries about.    Therapeutic modalities: Cognitive Behavioral Therapy and Solution-Oriented/Positive Psychology  Mental Status/Observations:  Appearance:   Casual and Neat     Behavior:  Appropriate  Motor:  Normal  Speech/Language:   Clear and Coherent  Affect:  Appropriate  Mood:  constricted and depressed  Thought process:  normal  Thought content:    WNL and except SI  Sensory/Perceptual disturbances:    WNL  Orientation:  Fully oriented  Attention:  Good    Concentration:  Good  Memory:  WNL  Insight:    Good  Judgment:   Good  Impulse Control:  Good   Risk Assessment: Danger to Self: Yes.  with intent/plan Self-injurious Behavior: No Danger to Others: No Physical Aggression / Violence: No Duty to Warn: No Access to Firearms a concern: No  Assessment of progress:  situational setback(s)  Diagnosis:    ICD-10-CM   1. Major depression, recurrent, chronic (HCC)  F33.9     2. Caregiver stress  Z63.6     3. Generalized anxiety disorder  F41.1     4. Relationship problem between partners  Z63.0      Plan:  Pledge of safety Work with Mark Fritz on shared plan of safety, how to recognize OK, what to do if not safe, reduce ambiguity Work with Mark Fritz on more metered times to check OCD concerns with him Establish buddy time with at least one friend Dispute personalizing and catastrophizing thought patterns, or at least table resenting things that are validly coarse or uncouth in the workplace Other recommendations/advice as may be noted above Continue to utilize previously learned skills ad lib Maintain medication as prescribed and work faithfully with relevant prescriber(s) if any changes are desired or seem indicated Call the clinic on-call service, present to ER, or call 911 if any life-threatening psychiatric crisis Return 1-2 wks as able. Already scheduled visit in this office 08/30/2020.  Mark Fries, PhD Mark Czar, PhD LP Clinical Psychologist, [facility] [address], [address] [place], [place] [address] [phone]

## 2020-08-30 ENCOUNTER — Other Ambulatory Visit: Payer: Self-pay | Admitting: Psychiatry

## 2020-08-30 ENCOUNTER — Other Ambulatory Visit: Payer: Self-pay

## 2020-08-30 ENCOUNTER — Ambulatory Visit (INDEPENDENT_AMBULATORY_CARE_PROVIDER_SITE_OTHER): Payer: BC Managed Care – PPO | Admitting: Psychiatry

## 2020-08-30 ENCOUNTER — Encounter: Payer: Self-pay | Admitting: Psychiatry

## 2020-08-30 DIAGNOSIS — F3342 Major depressive disorder, recurrent, in full remission: Secondary | ICD-10-CM | POA: Diagnosis not present

## 2020-08-30 DIAGNOSIS — F902 Attention-deficit hyperactivity disorder, combined type: Secondary | ICD-10-CM

## 2020-08-30 DIAGNOSIS — F339 Major depressive disorder, recurrent, unspecified: Secondary | ICD-10-CM | POA: Diagnosis not present

## 2020-08-30 DIAGNOSIS — F411 Generalized anxiety disorder: Secondary | ICD-10-CM

## 2020-08-30 MED ORDER — VIIBRYD 10 MG PO TABS: 10.0000 mg | ORAL_TABLET | Freq: Every day | ORAL | 1 refills | Status: DC

## 2020-08-30 MED ORDER — AMPHETAMINE SULFATE 10 MG PO TABS: ORAL_TABLET | ORAL | 0 refills | Status: DC

## 2020-08-30 MED ORDER — LITHIUM CARBONATE 300 MG PO CAPS: 300.0000 mg | ORAL_CAPSULE | Freq: Every day | ORAL | 1 refills | Status: DC

## 2020-08-30 NOTE — Progress Notes (Signed)
Mark Fritz [id number] [birth year] 35 y.o.  Subjective:   Patient ID:  [name] is a 35 y.o. (DOB [birth year]) male.  Chief Complaint:  Chief Complaint  Patient presents with  . Anxiety  . Depression    HPI Mark Fritz presents to the office today for follow-up of depression and anxiety. He reports that Lithium has not helped with anxiety and "despair." He reports that Lithium has helped with being able to control immediate responses to medications.   He reports that he has periods of increased anxiety. He reports feeling as if "I can't do anything right." He reports that he has constant anxiety and is fearful that he will lose his job. He rumination and wondering what might happen next. He reports that he has increased aches and pains without explanation. He reports that he has felt in "flight or flight mode" at times. He reports that he feels he is no longer enjoyable to be around. Sleep has been ok overall with some difficulty falling and staying asleep. Has been having stress dreams. Appetite has been decreased and is continuing to eat an adequate amount.   Persistent sad mood. He reports irritability. He reports some social withdrawal. He has been having difficulty exercising. Energy and motivation have been lower. Feelings of hopelessness. Minimal enjoyment in things. He reports suicidal thoughts have decreased "but they have sort of started to come back." He reports suicidal thoughts have been fleeting. Contracts for safety.   He reports that family vacation was stressful with going to family reunion, children unable to sleep, and possible bed bug exposure.   He reports that he has been experiencing anxiety at work. 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 He reports that at times anxiety and depression interfere with ability to perform job responsibilities.    Past  Psychiatric Medication Trials: Sertraline-adverse effects(initial GI side effects, insomnia, cognitive side effects, sexual side effects) Trintellix- Not effective and caused some "fuzzy head" Wellbutrin- irritability Adderall-decreased appetite, increased irritability Evekeo-effective for attention deficit with out significant tolerability issues Lithium   PHQ2-9   Flowsheet Row Office Visit from 02/07/2017 in [facility]  PHQ-2 Total Score 4  PHQ-9 Total Score 15       Review of Systems:  Review of Systems  Musculoskeletal: Positive for arthralgias, back pain and neck pain. Negative for gait problem.  Neurological: Positive for tremors.       Occ tremor  Psychiatric/Behavioral:       Please refer to HPI    Medications: I have reviewed the patient's current medications.  Current Outpatient Medications  Medication Sig Dispense Refill  . Amphetamine Sulfate (EVEKEO) 10 MG TABS Take 10 mg by mouth every morning AND 5 mg daily at 12 noon. 45 tablet 0  . doxylamine, Sleep, (UNISOM) 25 MG tablet Take 12.5 mg by mouth at bedtime as needed.     . Famotidine (PEPCID PO) Take by mouth daily as needed.    . Vilazodone HCl (VIIBRYD) 10 MG TABS Take 1 tablet (10 mg total) by mouth daily. (Please use free trial voucher in notes) 30 tablet 1  . Amphetamine Sulfate (EVEKEO) 10 MG TABS Take 10 mg by mouth every morning AND 5 mg daily at 12 noon. 45 tablet 0  . [START ON 09/27/2020] Amphetamine Sulfate (EVEKEO) 10 MG TABS Take 10 mg by mouth every morning AND 5 mg daily after lunch.  45 tablet 0  . diclofenac Sodium (VOLTAREN) 1 % GEL Apply topically 4 (four) times daily. (Patient not taking: Reported on 05/17/2020)    . lithium carbonate 300 MG capsule Take 1 capsule (300 mg total) by mouth at bedtime. 30 capsule 1  . nitroGLYCERIN (NITRODUR - DOSED IN MG/24 HR) 0.2 mg/hr patch Apply 1/4 patch daily to tendon for tendonitis. 30 patch 1   No current facility-administered  medications for this visit.    Medication Side Effects: None  Dizziness has resolved. Increased thirst.   Allergies: No Known Allergies  Past Medical History:  Diagnosis Date  . ADHD (attention deficit hyperactivity disorder)   . De Quervain's tenosynovitis, right 12/2017  . Dental crowns present    and 1 implant  . History of gastric ulcer   . 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   Please see review of systems for further details on the patient's review from today.   Objective:   Physical Exam:  There were no vitals taken for this visit.  Physical Exam Constitutional:      General: He is not in acute distress. Musculoskeletal:        General: No deformity.  Neurological:     Mental Status: He is alert and oriented to person, place, and time.     Coordination: Coordination normal.  Psychiatric:        Attention and Perception: Attention and perception normal. He does not perceive auditory or visual hallucinations.        Mood and Affect: Mood is anxious and depressed. Affect is not labile, blunt, angry or inappropriate.        Speech: Speech normal.        Behavior: Behavior normal.        Thought Content: Thought content normal. Thought content is not paranoid or delusional. Thought content does not include homicidal or suicidal ideation. Thought content does not include homicidal or suicidal plan.        Cognition and Memory: Cognition and memory normal.        Judgment: Judgment normal.     Comments: Insight intact     Lab Review:     Component Value Date/Time   NA 138 07/10/2017 0859   K 4.2 07/10/2017 0859   CL 101 07/10/2017 0859   CO2 30 07/10/2017 0859   GLUCOSE 107 (H) 07/10/2017 0859   BUN 18 07/10/2017 0859   CREATININE 1.04 07/10/2017 0859   CALCIUM 10.0 07/10/2017 0859   PROT 7.5 07/10/2017 0859   ALBUMIN 4.8 07/10/2017  0859   AST 21 07/10/2017 0859   ALT 30 07/10/2017 0859   ALKPHOS 49 07/10/2017 0859   BILITOT 0.5 07/10/2017 0859       Component Value Date/Time   WBC 5.4 07/10/2017 0859   RBC 5.39 07/10/2017 0859   HGB 15.6 07/10/2017 0859   HCT 45.0 07/10/2017 0859   PLT 250.0 07/10/2017 0859   MCV 83.5 07/10/2017 0859   MCHC 34.7 07/10/2017 0859   RDW 13.0 07/10/2017 0859   LYMPHSABS 2.1 07/10/2017 0859   MONOABS 0.4 07/10/2017 0859   EOSABS 0.1 07/10/2017 0859   BASOSABS 0.0 07/10/2017 0859    No results found for: POCLITH, LITHIUM   No results found for: PHENYTOIN, PHENOBARB, VALPROATE, CBMZ   .res Assessment: Plan:    Patient seen for 30 minutes and  time spent counseling patient regarding possible treatment options for anxiety if depression.  Discussed potential benefits, risks, and side effects of Viibryd, Cymbalta, and Abilify.  Patient agrees to trial of Viibryd.  Will start Viibryd 5 mg daily with food for 1 week then increase to 10 mg daily with food for mood and anxiety. Continue Evekeo 10 mg every morning and 5 mg midday for attention deficit disorder. Continue lithium 300 mg at bedtime to improve mood and improve suicidal ideation. Recommend continuing psychotherapy with [name], PhD. Patient to follow-up with this provider in 4 to 6 weeks or sooner if clinically indicated. Patient advised to contact office with any questions, adverse effects, or acute worsening in signs and symptoms.   Jaelynn was seen today for anxiety and depression.  Diagnoses and all orders for this visit:  Major depression, recurrent, chronic (HCC) -     Vilazodone HCl (VIIBRYD) 10 MG TABS; Take 1 tablet (10 mg total) by mouth daily. (Please use free trial voucher in notes)  Generalized anxiety disorder -     Vilazodone HCl (VIIBRYD) 10 MG TABS; Take 1 tablet (10 mg total) by mouth daily. (Please use free trial voucher in notes)  Recurrent major depressive disorder, in full remission (HCC) -      lithium carbonate 300 MG capsule; Take 1 capsule (300 mg total) by mouth at bedtime.  Attention deficit hyperactivity disorder (ADHD), combined type -     Amphetamine Sulfate (EVEKEO) 10 MG TABS; Take 10 mg by mouth every morning AND 5 mg daily after lunch.     Please see After Visit Summary for patient specific instructions.  Future Appointments  Date Time Provider Department Center  09/06/2020  8:00 AM Robley Fries, PhD [facility] None  10/07/2020 11:30 AM Corie Chiquito, PMHNP [facility] None    No orders of the defined types were placed in this encounter.   -------------------------------

## 2020-09-01 ENCOUNTER — Telehealth: Payer: Self-pay

## 2020-09-01 NOTE — Telephone Encounter (Signed)
Please review pharmacy note

## 2020-09-01 NOTE — Telephone Encounter (Signed)
Mark Fritz,  This was the response to Viibryd. Would you please try to complete a PA? He has been on Sertraline and Trintellix. Nefazodone is no longer available, so that's not a possible option. Thanks.

## 2020-09-01 NOTE — Telephone Encounter (Signed)
Prior approval received for VIIBRYD 10 MG effective 09/01/2020-09/01/2021, PA# [id number] with [employer]  ID# [id number]

## 2020-09-01 NOTE — Telephone Encounter (Signed)
His prior authorization is approved now but their message mentions the vouchers are expired ?

## 2020-09-05 DIAGNOSIS — Q112 Microphthalmos: Secondary | ICD-10-CM | POA: Diagnosis not present

## 2020-09-06 ENCOUNTER — Ambulatory Visit: Payer: BC Managed Care – PPO | Admitting: Psychiatry

## 2020-09-09 ENCOUNTER — Ambulatory Visit (INDEPENDENT_AMBULATORY_CARE_PROVIDER_SITE_OTHER): Payer: BC Managed Care – PPO | Admitting: Psychiatry

## 2020-09-09 ENCOUNTER — Other Ambulatory Visit: Payer: Self-pay

## 2020-09-09 DIAGNOSIS — Z636 Dependent relative needing care at home: Secondary | ICD-10-CM

## 2020-09-09 DIAGNOSIS — F339 Major depressive disorder, recurrent, unspecified: Secondary | ICD-10-CM

## 2020-09-09 DIAGNOSIS — F411 Generalized anxiety disorder: Secondary | ICD-10-CM | POA: Diagnosis not present

## 2020-09-09 DIAGNOSIS — Z630 Problems in relationship with spouse or partner: Secondary | ICD-10-CM

## 2020-09-09 NOTE — Progress Notes (Signed)
Psychotherapy Progress Note [facility] [name], PhD LP  Patient ID: Mark Fritz     MRN: [id number] Therapy format: Individual psychotherapy Date: 09/09/2020      Start: 2:12p     Stop: 3:00p     Time Spent: 48 min Location: In-person   Session narrative (presenting needs, interim history, self-report of stressors and symptoms, applications of prior therapy, status changes, and interventions made in session) Apologetic for miss the other day.  Less frankly depressed, less irritable, SI abating.  Psychiatry going to Viibryd, in respect of paralyzing anxiety, clearer understanding now that despair comes from anxiety, much tied to stringent sense of responsibility.  Clear that he does not enjoy what he does and is more or less stuck finding something that will make use of his intellect and gifts.  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  Current job soured more when management changed from permissive to [employer].    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  Says she told him the idea of "office hours" for checking worrisome minutiae was tried before somehow and didn't "work".  Skeptical, feels like she is protecting her problem by saying this, trying to avoid the challenge herself to abstain from addictively checking with him.  Challenge is not to resent it, feel used.  Resolved to approach [name] about a guest visit with her therapist, with hopes of seeking guidance for the burnout he's feeling as th recipient of her checking and compulsive sharing behaviors.  If available, can get to her "that's the way I am" rationalizing.    Therapeutic modalities: Cognitive Behavioral Therapy and Solution-Oriented/Positive Psychology  Mental Status/Observations:  Appearance:   Casual and Neat     Behavior:   Appropriate  Motor:  Normal  Speech/Language:   Clear and Coherent  Affect:  Appropriate  Mood:  dysthymic  Thought process:  normal  Thought content:    WNL  Sensory/Perceptual disturbances:    WNL  Orientation:  Fully oriented  Attention:  Good    Concentration:  Good  Memory:  WNL  Insight:    Good  Judgment:   Good  Impulse Control:  Good   Risk Assessment: Danger to Self: No Self-injurious Behavior: No Danger to Others: No Physical Aggression / Violence: No Duty to Warn: No Access to Firearms a concern: No  Assessment of progress:  progressing  Diagnosis:   ICD-10-CM   1. Major depression, recurrent, chronic (HCC)  F33.9     2. Generalized anxiety disorder  F41.1     3. Caregiver stress  Z63.6     4. Relationship problem between partners  Z63.0      Plan:  Option to schedule with [name] to sit in with her therapist for pointers and acknowledgment of hardships Option to ask [name] to build in a "knock first" before automatically sharing or asking reassurance or otherwise imposing Other recommendations/advice as may be noted above Continue to utilize previously learned skills ad lib Maintain medication as prescribed and work faithfully with relevant prescriber(s) if any changes are desired or seem indicated Call the clinic on-call service, present to ER, or call 911 if any life-threatening psychiatric crisis Return 1-2 wks. Already scheduled visit in this office 10/07/2020.  Robley Fries, PhD [name], PhD LP Clinical Psychologist, [facility]  [facility] [address], [place], [place] [address] [phone]

## 2020-09-19 DIAGNOSIS — M9901 Segmental and somatic dysfunction of cervical region: Secondary | ICD-10-CM | POA: Diagnosis not present

## 2020-09-19 DIAGNOSIS — S2341XA Sprain of ribs, initial encounter: Secondary | ICD-10-CM | POA: Diagnosis not present

## 2020-09-19 DIAGNOSIS — M9905 Segmental and somatic dysfunction of pelvic region: Secondary | ICD-10-CM | POA: Diagnosis not present

## 2020-09-19 DIAGNOSIS — M9903 Segmental and somatic dysfunction of lumbar region: Secondary | ICD-10-CM | POA: Diagnosis not present

## 2020-09-20 ENCOUNTER — Encounter: Payer: Self-pay | Admitting: Family Medicine

## 2020-09-20 ENCOUNTER — Ambulatory Visit: Payer: Self-pay

## 2020-09-20 ENCOUNTER — Ambulatory Visit (INDEPENDENT_AMBULATORY_CARE_PROVIDER_SITE_OTHER): Payer: BC Managed Care – PPO | Admitting: Family Medicine

## 2020-09-20 ENCOUNTER — Other Ambulatory Visit: Payer: Self-pay

## 2020-09-20 VITALS — BP 130/84 | HR 86 | Ht 67.0 in | Wt 210.2 lb

## 2020-09-20 DIAGNOSIS — R109 Unspecified abdominal pain: Secondary | ICD-10-CM | POA: Diagnosis not present

## 2020-09-20 NOTE — Patient Instructions (Addendum)
Thank you for coming in today.  I've referred you to Physical Therapy.  Let us know if you don't hear from them in one week.  Please perform the exercise program that we have prepared for you and gone over in detail on a daily basis.  In addition to the handout you were provided you can access your program through: [email or web address]   Your unique program code is:  [email or web address]   Recheck in 6 weeks especially if not better.   Ok to work back into activity when feeling better.  Go slow first.

## 2020-09-20 NOTE — Progress Notes (Signed)
   I, Mark Fritz, LAT, ATC, am serving as scribe for Dr. Clementeen Graham.  Mark Fritz is a 35 y.o. male who presents to [facility] today for R side/rib pain x 2 weeks. Pt was last seen by Dr. Denyse Fritz on 08/24/19 for chronic L heel pain. Today, pt locates his pain to his R lateral trunk and rib both ant and post.   Radiating pain: yes into the ant and post rib at that level Aggravating factors: R trunk rotation like when winding up for a golf swing; serratus ant punch movement; deep breaths Treatments tried: chiropractor; IBU;    Pertinent review of systems: No fevers or chills  Relevant historical information: ADHD.  History of de Quervain's tenosynovitis.   Exam:  BP 130/84 (BP Location: Right Arm, Patient Position: Sitting, Cuff Size: Normal)   Pulse 86   Ht 5\' 7"  (1.702 m)   Wt 210 lb 3.2 oz (95.3 kg)   SpO2 96%   BMI 32.92 kg/m  General: Well Developed, well nourished, and in no acute distress.   MSK: T-spine nontender midline.  Normal thoracic motion. Normal shoulder motion. Some pain with resisted shoulder motion felt in lateral to anterior chest wall right and inferior periscapular region.     Assessment and Plan: 35 y.o. male with right lateral to anterior chest wall pain associated with arm motion.  Very likely related to muscle dysfunction of the serratus anterior or scapular stabilization muscles.  Plan for physical therapy and home exercise program.  Recheck in about 6 weeks.  If not improving certainly could proceed with further evaluation including imaging.   PDMP not reviewed this encounter. Orders Placed This Encounter  Procedures  . Ambulatory referral to Physical Therapy    Referral Priority:   Routine    Referral Type:   Physical Medicine    Referral Reason:   Specialty Services Required    Requested Specialty:   Physical Therapy   No orders of the defined types were placed in this encounter.    Discussed warning  signs or symptoms. Please see discharge instructions. Patient expresses understanding.   The above documentation has been reviewed and is accurate and complete 31, M.D.

## 2020-09-25 ENCOUNTER — Other Ambulatory Visit: Payer: Self-pay | Admitting: Psychiatry

## 2020-09-25 DIAGNOSIS — F3342 Major depressive disorder, recurrent, in full remission: Secondary | ICD-10-CM

## 2020-10-07 ENCOUNTER — Encounter: Payer: Self-pay | Admitting: Psychiatry

## 2020-10-07 ENCOUNTER — Ambulatory Visit (INDEPENDENT_AMBULATORY_CARE_PROVIDER_SITE_OTHER): Payer: BC Managed Care – PPO | Admitting: Psychiatry

## 2020-10-07 ENCOUNTER — Other Ambulatory Visit: Payer: Self-pay

## 2020-10-07 DIAGNOSIS — F3342 Major depressive disorder, recurrent, in full remission: Secondary | ICD-10-CM

## 2020-10-07 DIAGNOSIS — F902 Attention-deficit hyperactivity disorder, combined type: Secondary | ICD-10-CM

## 2020-10-07 DIAGNOSIS — F411 Generalized anxiety disorder: Secondary | ICD-10-CM

## 2020-10-07 DIAGNOSIS — F339 Major depressive disorder, recurrent, unspecified: Secondary | ICD-10-CM | POA: Diagnosis not present

## 2020-10-07 DIAGNOSIS — Z566 Other physical and mental strain related to work: Secondary | ICD-10-CM | POA: Diagnosis not present

## 2020-10-07 DIAGNOSIS — Z636 Dependent relative needing care at home: Secondary | ICD-10-CM | POA: Diagnosis not present

## 2020-10-07 DIAGNOSIS — F3341 Major depressive disorder, recurrent, in partial remission: Secondary | ICD-10-CM

## 2020-10-07 MED ORDER — LITHIUM CARBONATE 300 MG PO CAPS: ORAL_CAPSULE | ORAL | 0 refills | Status: DC

## 2020-10-07 MED ORDER — LITHIUM CARBONATE 300 MG PO CAPS
ORAL_CAPSULE | ORAL | 0 refills | Status: DC
Start: 2020-10-07 — End: 2020-12-15

## 2020-10-07 MED ORDER — AMPHETAMINE SULFATE 10 MG PO TABS: ORAL_TABLET | ORAL | 0 refills | Status: DC

## 2020-10-07 MED ORDER — VILAZODONE HCL 10 MG PO TABS: 10.0000 mg | ORAL_TABLET | Freq: Every day | ORAL | 1 refills | Status: DC

## 2020-10-07 NOTE — Progress Notes (Signed)
Psychotherapy Progress Note [facility] [name], PhD LP  Patient ID: Mark Fritz     MRN: [id number] Therapy format: Individual psychotherapy Date: 10/07/2020      Start: 10:12a     Stop: 11:02a     Time Spent: 50 min Location: In-person   Session narrative (presenting needs, interim history, self-report of stressors and symptoms, applications of prior therapy, status changes, and interventions made in session) Mood been a lot better, sees Viibryd helping.  Philosophically, feeling more free to just be direct with others at work, rather than try too hard to be nice and suffer.  Actually happier going ahead and doing what's necessary instead of grousing about it.   Mark Fritz still has problems with reassurance-seeking OCD and neglecting pledged responsibilities (for overworrying, generally).  Have had introductory couples counseling, and has been surprised to hear Mark Fritz present some things in absolute terms, more complaining of him than expected.  Some apprehension himself whether the male counselor will naturally understand or be open to his perspective, a common worry, but clear here that Mark Fritz means to be compassionate, he's just been living so ling with multiple pressures himself from dealing moral dilemmas, unfulfillment, and existential loneliness in his work, feeling such a moral weight from his understood role as husband and father, and intermittently alarmed at his own depressive response and capacity to feel -- and occasionally express -- anger.    Discussed difficulty articulating his feelings and wishes, reflected back his great tendencies to assume responsibility and having to "swallow" his feelings and wishes.  Identified grief for lost dream of how things were going to be before Mark Fritz got sick (had shingles in her ear, with nerve damage, largely bedridden for 6 months some time back).  Mark Fritz herself grew up hyperresponsible as a child for how her mother was  often dysfunctional, bedridden sometimes, and understanding is she developed OCD probably as an internalizing coping mechanism.  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  ADHD medication helped, but the story felt like he set himself up to have to do a profession he can't really handle, in a culture he can't really cosign.  Has become disillusioned with all the cynicism and egos and workplace politics he's had to see working in different domains of Social worker.  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  The hours required were wearing him out, frankly, as expressed at intake.  Disc. possible outlets for his competitive energy, e.g., adult soccer.  Disc. Approach to marriage counseling and balanced communication there.  Therapeutic modalities: Cognitive Behavioral Therapy, Solution-Oriented/Positive Psychology, and Assertiveness/Communication  Mental Status/Observations:  Appearance:   Casual     Behavior:  Appropriate  Motor:  Normal  Speech/Language:   Clear and Coherent  Affect:  Appropriate  Mood:  frustrated  Thought process:  normal  Thought content:    WNL  Sensory/Perceptual disturbances:    WNL  Orientation:  Fully oriented  Attention:  Good    Concentration:  Good  Memory:  WNL  Insight:    Good  Judgment:   Good  Impulse Control:  Good   Risk Assessment: Danger to Self: No Self-injurious Behavior: No Danger to Others: No Physical Aggression / Violence: No Duty to Warn: No Access to Firearms a concern: No  Assessment of progress:  stabilized  Diagnosis:  ICD-10-CM   1. Recurrent major depressive disorder, in partial remission (HCC)  F33.41     2. Generalized anxiety disorder  F41.1     3. Caregiver stress  Z63.6     4. Work stress  Z56.6      Plan:  In communication  with Mark Fritz, continue to practice leading with empathy, use assertive inquiry to draw out her complaints and, as indicated, usher her toward reevaluating her own attributions.   Be willing to be frank about feeling frustrated, or feeling maligned if it comes to it, since suppressing these is apt to be misread as muted rage, when it is in fact frustration but could turn into greater anger if oversuppressed.  As needed, ask for open listening while granting that she can decide what to do with it once she hears him out, but pls listen through and don't jump to catastrophic conclusions if he raises an issue. Ward off any defensive thoughts about Mark Fritz turning on him, if they arise. In dealing with Mark Fritz's reassurance-seeking, try to practice "I can say, but first, what do you think?" Other recommendations/advice as may be noted above Continue to utilize previously learned skills ad lib Maintain medication as prescribed and work faithfully with relevant prescriber(s) if any changes are desired or seem indicated Call the clinic on-call service, present to ER, or call 911 if any life-threatening psychiatric crisis Return for time as available. Already scheduled visit in this office 10/07/2020 (med mgmt)  Robley Fries, PhD [name], PhD LP Clinical Psychologist, [facility] [address] [place], [place] [address] [phone]

## 2020-10-07 NOTE — Progress Notes (Signed)
Mark Fritz [id number] 07/12/1985 35 y.o.  Subjective:   Patient ID:  Mark Fritz is a 35 y.o. (DOB [birth year]) male.  Chief Complaint:  Chief Complaint  Patient presents with   Anxiety   Depression    HPI Mark Fritz presents to the office today for follow-up of anxiety, depression, and ADHD. He reports that Viibryd seems to be working well. He has been decreasing use of Evekeo. He reports that there were a few times that he felt flushed with the combination. He reports that he has been taking Evekeo 10 mg daily (5 mg BID). He now recognizes that at times distractions have been related to worrying about something else.   He has noticed on his fitness tracker that his resting heart rate has decreased from the 70's to an average of 63.   He reports that his mood has been improved. He reports that his coworker has noticed a change in his mood and anxiety.   Energy and motivation have been improved. Some slight sleep issues and is not sure if this is related to new mattress. He used to sleep 6 hours every night. He is now having a couple of nights of disrupted sleep followed by a night of very good sleep. He reports that sleep disturbance has been gradually improving. He now notices he needs to take Evekeo earlier. He reports that appetite has increased and is working to control this. He recently has been decreasing sugar intake. "I'm living more on purpose." Has been spending time with family. Denies any recent SI.   Past Psychiatric Medication Trials: Sertraline-Effective. Multiple adverse effects (initial GI side effects, insomnia, cognitive side effects, sexual side effects) Trintellix- Not effective and caused some "fuzzy head" Wellbutrin- irritability Adderall- decreased appetite, increased irritability Evekeo- effective for attention deficit with out significant tolerability issues Lithium  PHQ2-9    Flowsheet Row Office Visit from 02/07/2017 in [facility]  PHQ-2 Total Score 4  PHQ-9 Total Score 15        Review of Systems:  Review of Systems  Musculoskeletal:  Negative for gait problem.  Neurological:  Negative for tremors.  Psychiatric/Behavioral:         Please refer to HPI   Medications: I have reviewed the patient's current medications.  Current Outpatient Medications  Medication Sig Dispense Refill   Amphetamine Sulfate (EVEKEO) 10 MG TABS Take 10 mg by mouth every morning AND 5 mg daily after lunch. 45 tablet 0   [START ON 11/04/2020] Amphetamine Sulfate (EVEKEO) 10 MG TABS Take 10 mg by mouth every morning AND 5 mg daily at 12 noon. 45 tablet 0   [START ON 12/02/2020] Amphetamine Sulfate (EVEKEO) 10 MG TABS Take 10 mg by mouth every morning AND 5 mg daily at 12 noon. 45 tablet 0   diclofenac Sodium (VOLTAREN) 1 % GEL Apply topically 4 (four) times daily.     doxylamine, Sleep, (UNISOM) 25 MG tablet Take 12.5 mg by mouth at bedtime as needed.      Famotidine (PEPCID PO) Take by mouth daily as needed.     lithium carbonate 300 MG capsule TAKE 1 CAPSULE BY MOUTH EVERYDAY AT BEDTIME 90 capsule 0   Vilazodone HCl (VIIBRYD) 10 MG TABS Take 1 tablet (10 mg total) by mouth daily. (Please use free trial voucher in notes) 90 tablet 1   No current facility-administered medications for this visit.    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   Please see review of systems for further details on the patient's review from today.   Objective:   Physical Exam:  BP 130/79   Pulse 73   Physical Exam Constitutional:      General: He  is not in acute distress. Musculoskeletal:        General: No deformity.  Neurological:     Mental Status: He is alert and oriented to person, place, and time.     Coordination: Coordination normal.  Psychiatric:        Attention and Perception: Attention and perception normal. He does not perceive auditory or visual hallucinations.        Mood and Affect: Mood normal. Mood is not anxious or depressed. Affect is not labile, blunt, angry or inappropriate.        Speech: Speech normal.        Behavior: Behavior normal.        Thought Content: Thought content normal. Thought content is not paranoid or delusional. Thought content does not include homicidal or suicidal ideation. Thought content does not include homicidal or suicidal plan.        Cognition and Memory: Cognition and memory normal.        Judgment: Judgment normal.     Comments: Insight intact    Lab Review:     Component Value Date/Time   NA 138 07/10/2017 0859   K 4.2 07/10/2017 0859   CL 101 07/10/2017 0859   CO2 30 07/10/2017 0859   GLUCOSE 107 (H) 07/10/2017 0859   BUN 18 07/10/2017 0859   CREATININE 1.04 07/10/2017 0859   CALCIUM 10.0 07/10/2017 0859   PROT 7.5 07/10/2017 0859   ALBUMIN 4.8 07/10/2017 0859   AST 21 07/10/2017 0859   ALT 30 07/10/2017 0859   ALKPHOS 49 07/10/2017 0859   BILITOT 0.5 07/10/2017 0859       Component Value Date/Time   WBC 5.4 07/10/2017 0859   RBC 5.39 07/10/2017 0859   HGB 15.6 07/10/2017 0859   HCT 45.0 07/10/2017 0859   PLT 250.0 07/10/2017 0859   MCV 83.5 07/10/2017 0859   MCHC 34.7 07/10/2017 0859   RDW 13.0 07/10/2017 0859   LYMPHSABS 2.1 07/10/2017 0859   MONOABS 0.4 07/10/2017 0859   EOSABS 0.1 07/10/2017 0859   BASOSABS 0.0 07/10/2017 0859    No results found for: POCLITH, LITHIUM   No results found for: PHENYTOIN, PHENOBARB, VALPROATE, CBMZ   .res Assessment: Plan:    Patient seen for 30 minutes and time spent counseling the patient regarding continuation  of Viibryd and possible cost saving measures to include downloading co-pay savings card from [employer].  Will continue 10 mg dose at this time since patient reports significant improvement in mood and anxiety signs and symptoms.   Will continue Evekeo for attention deficit disorder.  Patient reports that he plans to try to reduce dose of Evekeo if possible. Continue lithium 300 mg at bedtime for mood signs and symptoms and prevention suicidal ideation. Patient to follow-up in 3 months or sooner if clinically indicated. Recommend continuing psychotherapy  with [name], PhD. Patient advised to contact office with any questions, adverse effects, or acute worsening in signs and symptoms.   Siler was seen today for anxiety and depression.  Diagnoses and all orders for this visit:  Major depression, recurrent, chronic (HCC) -     Vilazodone HCl (VIIBRYD) 10 MG TABS; Take 1 tablet (10 mg total) by mouth daily. (Please use free trial voucher in notes)  Generalized anxiety disorder -     Vilazodone HCl (VIIBRYD) 10 MG TABS; Take 1 tablet (10 mg total) by mouth daily. (Please use free trial voucher in notes)  Attention deficit hyperactivity disorder (ADHD), combined type -     Amphetamine Sulfate (EVEKEO) 10 MG TABS; Take 10 mg by mouth every morning AND 5 mg daily at 12 noon. -     Amphetamine Sulfate (EVEKEO) 10 MG TABS; Take 10 mg by mouth every morning AND 5 mg daily at 12 noon.  Recurrent major depressive disorder, in full remission (HCC) -     Discontinue: lithium carbonate 300 MG capsule; TAKE 1 CAPSULE BY MOUTH EVERYDAY AT BEDTIME -     lithium carbonate 300 MG capsule; TAKE 1 CAPSULE BY MOUTH EVERYDAY AT BEDTIME    Please see After Visit Summary for patient specific instructions.  Future Appointments  Date Time Provider Department Center  10/21/2020  8:00 AM Robley Fries, PhD [facility] None  11/04/2020  8:00 AM Rodolph Bong, MD [facility] None  01/06/2021  9:00 AM Corie Chiquito,  PMHNP [facility] None    No orders of the defined types were placed in this encounter.   -------------------------------

## 2020-10-10 DIAGNOSIS — M9903 Segmental and somatic dysfunction of lumbar region: Secondary | ICD-10-CM | POA: Diagnosis not present

## 2020-10-10 DIAGNOSIS — S2341XA Sprain of ribs, initial encounter: Secondary | ICD-10-CM | POA: Diagnosis not present

## 2020-10-10 DIAGNOSIS — M9905 Segmental and somatic dysfunction of pelvic region: Secondary | ICD-10-CM | POA: Diagnosis not present

## 2020-10-10 DIAGNOSIS — M9901 Segmental and somatic dysfunction of cervical region: Secondary | ICD-10-CM | POA: Diagnosis not present

## 2020-10-21 ENCOUNTER — Ambulatory Visit: Payer: BC Managed Care – PPO | Admitting: Psychiatry

## 2020-10-21 ENCOUNTER — Encounter: Payer: Self-pay | Admitting: Psychiatry

## 2020-11-04 ENCOUNTER — Ambulatory Visit: Payer: BC Managed Care – PPO | Admitting: Family Medicine

## 2020-11-25 DIAGNOSIS — Z20822 Contact with and (suspected) exposure to covid-19: Secondary | ICD-10-CM | POA: Diagnosis not present

## 2020-11-30 DIAGNOSIS — Z20822 Contact with and (suspected) exposure to covid-19: Secondary | ICD-10-CM | POA: Diagnosis not present

## 2020-12-15 ENCOUNTER — Encounter: Payer: Self-pay | Admitting: Psychiatry

## 2020-12-15 ENCOUNTER — Telehealth (INDEPENDENT_AMBULATORY_CARE_PROVIDER_SITE_OTHER): Payer: BC Managed Care – PPO | Admitting: Psychiatry

## 2020-12-15 DIAGNOSIS — F3342 Major depressive disorder, recurrent, in full remission: Secondary | ICD-10-CM | POA: Diagnosis not present

## 2020-12-15 DIAGNOSIS — F339 Major depressive disorder, recurrent, unspecified: Secondary | ICD-10-CM | POA: Diagnosis not present

## 2020-12-15 DIAGNOSIS — F411 Generalized anxiety disorder: Secondary | ICD-10-CM | POA: Diagnosis not present

## 2020-12-15 MED ORDER — LITHIUM CARBONATE 150 MG PO CAPS: 450.0000 mg | ORAL_CAPSULE | Freq: Every day | ORAL | 1 refills | Status: DC

## 2020-12-15 MED ORDER — VILAZODONE HCL 20 MG PO TABS: 20.0000 mg | ORAL_TABLET | Freq: Every day | ORAL | 1 refills | Status: DC

## 2020-12-15 NOTE — Progress Notes (Signed)
Mark Fritz [id number] [birth year] 35 y.o.  Virtual Visit via Video Note  I connected with pt @ on 12/15/20 at  9:30 AM EDT by a video enabled telemedicine application and verified that I am speaking with the correct person using two identifiers.   I discussed the limitations of evaluation and management by telemedicine and the availability of in person appointments. The patient expressed understanding and agreed to proceed.  I discussed the assessment and treatment plan with the patient. The patient was provided an opportunity to ask questions and all were answered. The patient agreed with the plan and demonstrated an understanding of the instructions.   The patient was advised to call back or seek an in-person evaluation if the symptoms worsen or if the condition fails to improve as anticipated.  I provided 45 minutes of non-face-to-face time during this encounter.  The patient was located at home.  [facility]   Corie ChiquitoJessica Lam Mccubbins, PMHNP   Subjective:   Patient ID:  Mark BourbonBryce R Fritz is a 35 y.o. (DOB [birth year]) male.  Chief Complaint:  Chief Complaint  Patient presents with   Depression    Depression       Bobbye CharlestonBryce R Scheid presents for follow-up of depression, anxiety, and ADHD. He  reports that he and his wife have been meeting with a couple's therapist to help cope with wife's depression and OCD. He reports that he shared with couple's therapist that he was "in a bad place" and therapist urged him to seek immediate help.   He reports that he had recent suicidal thoughts with plan. Reports experiencing suicidal intent yesterday. He reports that he has been having suicidal thoughts "almost every day." He reports that he has had suicidal thoughts for a few weeks and has had worsening depression over the last month. He reports, "it's lightened up knowing that there may be something I can do to help, but the weight is still there." Denies suicidal  intent at time of visit. He contracts for safety and reports "it was kind of a wake up call."   He reports feeling overwhelmed at times by the needs of others. He reports that he has had rumination. He reports that when he is starting to go to sleep he will hear someone say his name with some urgency. He reports that he has been looking for ways to escape, "whether it's food... looking at things online." Denies panic s/s. He reports that his sleep was "getting pretty bad." Some recent improvement in insomnia. Has been craving sugar. He reports energy and motivation have been low. He has been trying to push himself to exercise "because if I don't, that's the danger" and will lead to worsening depression. Concentration has been difficult. He reports that Stann Mainlandvekeo has been minimally effective.   He reports that he has had distress with events that are transpiring with the country. Reports some difficulties with work and family.   Past Psychiatric Medication Trials: Sertraline-Effective. Multiple adverse effects (initial GI side effects, insomnia, cognitive side effects, sexual side effects) Trintellix- Not effective and caused some "fuzzy head" Viibryd Wellbutrin- irritability Adderall- decreased appetite, increased irritability Evekeo- effective for attention deficit with out significant tolerability issues Lithium  Review of Systems:  Review of Systems  Gastrointestinal:        Improved heartburn  Musculoskeletal:  Negative for gait problem.  Neurological:        Occ mild tremor  Psychiatric/Behavioral:  Positive for depression.  Please refer to HPI   Medications: I have reviewed the patient's current medications.  Current Outpatient Medications  Medication Sig Dispense Refill   Amphetamine Sulfate (EVEKEO) 10 MG TABS Take 10 mg by mouth every morning AND 5 mg daily at 12 noon. 45 tablet 0   doxylamine, Sleep, (UNISOM) 25 MG tablet Take 12.5 mg by mouth at bedtime as needed.       Vilazodone HCl 20 MG TABS Take 1 tablet (20 mg total) by mouth daily. 30 tablet 1   Amphetamine Sulfate (EVEKEO) 10 MG TABS Take 10 mg by mouth every morning AND 5 mg daily after lunch. 45 tablet 0   Amphetamine Sulfate (EVEKEO) 10 MG TABS Take 10 mg by mouth every morning AND 5 mg daily at 12 noon. 45 tablet 0   Famotidine (PEPCID PO) Take by mouth daily as needed. (Patient not taking: Reported on 12/15/2020)     lithium carbonate 150 MG capsule Take 3 capsules (450 mg total) by mouth at bedtime. 90 capsule 1   No current facility-administered medications for this visit.    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    Family History  Problem Relation Age of Onset   Depression Mother    Diabetes Mother    Miscarriages / [place] Mother    Hearing loss Father    Hypertension Father    Mental illness Maternal Grandmother    Hypertension Paternal Grandfather    Stroke Paternal Grandfather    Depression Sister     Social History   Socioeconomic History   Marital status: Married    Spouse name: Not on file   Number of children: Not on file   Years of education: Not on file   Highest education level: Not on file  Occupational History   Not on file  Tobacco Use   Smoking status: Never   Smokeless tobacco: Never  Vaping Use   Vaping Use: Never used  Substance and Sexual Activity   Alcohol use: No   Drug use: No   Sexual activity: Yes  Other Topics Concern   Not on file  Social History Narrative   Not on file   Social Determinants of Health   Financial Resource Strain: Not on file  Food Insecurity: Not on file  Transportation Needs: Not on file  Physical Activity: Not on  file  Stress: Not on file  Social Connections: Not on file  Intimate Partner Violence: Not on file    Past Medical History, Surgical history, Social history, and Family history were reviewed and updated as appropriate.   Please see review of systems for further details on the patient's review from today.   Objective:   Physical Exam:  There were no vitals taken for this visit.  Physical Exam Neurological:     Mental Status: He is alert and oriented to person, place, and time.     Cranial Nerves: No dysarthria.  Psychiatric:        Attention and Perception: Attention and perception normal.        Mood and Affect: Mood is anxious and depressed.        Speech: Speech normal.  Behavior: Behavior is cooperative.        Thought Content: Thought content is not paranoid or delusional. Thought content does not include homicidal ideation. Thought content does not include homicidal plan.        Cognition and Memory: Cognition and memory normal.        Judgment: Judgment normal.     Comments: Insight intact Suicidal thoughts without intent. contracts for safety    Lab Review:     Component Value Date/Time   NA 138 07/10/2017 0859   K 4.2 07/10/2017 0859   CL 101 07/10/2017 0859   CO2 30 07/10/2017 0859   GLUCOSE 107 (H) 07/10/2017 0859   BUN 18 07/10/2017 0859   CREATININE 1.04 07/10/2017 0859   CALCIUM 10.0 07/10/2017 0859   PROT 7.5 07/10/2017 0859   ALBUMIN 4.8 07/10/2017 0859   AST 21 07/10/2017 0859   ALT 30 07/10/2017 0859   ALKPHOS 49 07/10/2017 0859   BILITOT 0.5 07/10/2017 0859       Component Value Date/Time   WBC 5.4 07/10/2017 0859   RBC 5.39 07/10/2017 0859   HGB 15.6 07/10/2017 0859   HCT 45.0 07/10/2017 0859   PLT 250.0 07/10/2017 0859   MCV 83.5 07/10/2017 0859   MCHC 34.7 07/10/2017 0859   RDW 13.0 07/10/2017 0859   LYMPHSABS 2.1 07/10/2017 0859   MONOABS 0.4 07/10/2017 0859   EOSABS 0.1 07/10/2017 0859   BASOSABS 0.0 07/10/2017 0859    No  results found for: POCLITH, LITHIUM   No results found for: PHENYTOIN, PHENOBARB, VALPROATE, CBMZ   .res Assessment: Plan:   Pt seen for 45 minutes and time spent discussing pt's questions regarding treatment options for depression.  Discussed PHP and  IOP programs and provided pt with contact information for [facility] and [facility]. Also discussed his questions about TMS and provided him with referral information for [facility]. He reports that he may also be interested in Spravato treatment and discussed potential benefits, risks, and side effects of Spravato.  Pt reports that he is considering a solution oriented approach to therapy.  Discussed potential benefits, risks, and side effects of increasing Lithium to 450 mg po QHS to improve suicidal thoughts since he reports that lower doses of Lithium were helpful for suicidal thoughts in the past.  Discussed potential benefits, risks, and side effects of Rexulti. Discussed potential metabolic side effects associated with atypical antipsychotics, as well as potential risk for movement side effects. Advised pt to contact office if movement side effects occur.  Pt reports that he would prefer to increase Viibryd instead of initiating Rexulti since he has tolerated Viibryd well and has experienced some improvement with Viibryd 10 mg daily. Will increase Viibryd to 20 mg po qd for mood and anxiety s/s.  Continue Evekeo 10 mg po q am and 5 mg po q noon for ADHD. Pt to follow-up in 3 weeks or sooner if clinically indicated. Offered earlier follow-up however pt reports that he will be seeing therapist in one week and prefers to follow-up on 9/9 as scheduled.  Patient advised to contact office with any questions, adverse effects, or acute worsening in signs and symptoms.   [place] was seen today for depression.  Diagnoses and all orders for this visit:  Recurrent major depressive disorder, in full remission (HCC)  Major  depression, recurrent, chronic (HCC) -     lithium carbonate 150 MG capsule; Take 3 capsules (450 mg total) by mouth at bedtime. -  Vilazodone HCl 20 MG TABS; Take 1 tablet (20 mg total) by mouth daily.  Generalized anxiety disorder -     Vilazodone HCl 20 MG TABS; Take 1 tablet (20 mg total) by mouth daily.    Please see After Visit Summary for patient specific instructions.  Future Appointments  Date Time Provider Department Center  12/23/2020 11:00 AM Robley Fries, PhD [facility] None  01/06/2021  9:00 AM Corie Chiquito, PMHNP [facility] None    No orders of the defined types were placed in this encounter.     -------------------------------

## 2020-12-23 ENCOUNTER — Ambulatory Visit: Payer: BC Managed Care – PPO | Admitting: Psychiatry

## 2020-12-24 ENCOUNTER — Other Ambulatory Visit: Payer: Self-pay | Admitting: Psychiatry

## 2020-12-24 DIAGNOSIS — F3342 Major depressive disorder, recurrent, in full remission: Secondary | ICD-10-CM

## 2021-01-06 ENCOUNTER — Other Ambulatory Visit (HOSPITAL_COMMUNITY)
Admission: EM | Admit: 2021-01-06 | Discharge: 2021-01-06 | Disposition: A | Discharge status: Home / Self Care | Payer: BC Managed Care – PPO | Attending: Behavioral Health | Admitting: Behavioral Health

## 2021-01-06 ENCOUNTER — Encounter: Payer: Self-pay | Admitting: Psychiatry

## 2021-01-06 ENCOUNTER — Other Ambulatory Visit: Payer: Self-pay

## 2021-01-06 ENCOUNTER — Ambulatory Visit (INDEPENDENT_AMBULATORY_CARE_PROVIDER_SITE_OTHER): Payer: BC Managed Care – PPO | Admitting: Psychiatry

## 2021-01-06 DIAGNOSIS — F339 Major depressive disorder, recurrent, unspecified: Secondary | ICD-10-CM

## 2021-01-06 DIAGNOSIS — F902 Attention-deficit hyperactivity disorder, combined type: Secondary | ICD-10-CM

## 2021-01-06 DIAGNOSIS — F332 Major depressive disorder, recurrent severe without psychotic features: Secondary | ICD-10-CM | POA: Diagnosis present

## 2021-01-06 DIAGNOSIS — F411 Generalized anxiety disorder: Secondary | ICD-10-CM | POA: Diagnosis not present

## 2021-01-06 MED ORDER — MAGNESIUM HYDROXIDE 400 MG/5ML PO SUSP
30.0000 mL | Freq: Every day | ORAL | Status: DC | PRN
Start: 2021-01-06 — End: 2021-01-06

## 2021-01-06 MED ORDER — HYDROXYZINE HCL 25 MG PO TABS: 25.0000 mg | ORAL_TABLET | Freq: Three times a day (TID) | ORAL | Status: DC | PRN

## 2021-01-06 MED ORDER — ALUM & MAG HYDROXIDE-SIMETH 200-200-20 MG/5ML PO SUSP: 30.0000 mL | ORAL | Status: DC | PRN

## 2021-01-06 MED ORDER — HYDROXYZINE PAMOATE 25 MG PO CAPS: 25.0000 mg | ORAL_CAPSULE | Freq: Three times a day (TID) | ORAL | 0 refills | Status: DC | PRN

## 2021-01-06 MED ORDER — ACETAMINOPHEN 325 MG PO TABS: 650.0000 mg | ORAL_TABLET | Freq: Four times a day (QID) | ORAL | Status: DC | PRN

## 2021-01-06 MED ORDER — VILAZODONE HCL 20 MG PO TABS
20.0000 mg | ORAL_TABLET | Freq: Every day | ORAL | Status: DC
  Filled 2021-01-06: qty 1

## 2021-01-06 MED ORDER — AMPHETAMINE SULFATE 10 MG PO TABS: ORAL_TABLET | ORAL | 0 refills | Status: DC

## 2021-01-06 MED ORDER — LITHIUM CARBONATE 300 MG PO CAPS: 450.0000 mg | ORAL_CAPSULE | Freq: Every day | ORAL | Status: DC

## 2021-01-06 NOTE — ED Provider Notes (Signed)
Behavioral Health Urgent Care Medical Screening Exam  Patient Name: Mark Fritz MRN: [id number] Date of Evaluation: 01/06/21 Chief Complaint:  Pt reported increasing SI with mental development of plans and increasing feelings that he might follow through. Pt does say that love of his family keeps him from attempting. Pt reported issues with anxiety and ADHD as well. Pt and pt's wife do not feel safe and were referred her after he had an appointment with his NP for medication management earlier today. Pt has a therapist at [facility]. who they say has expressed concern too. Pt denies HI, AVH, paranoia and any drug/alcohol use. Pt owns guns but he does not have access to them now. Diagnosis:  Final diagnoses:  MDD (major depressive disorder), recurrent severe, without psychosis (HCC)    History of Present illness: Mark Fritz is a 35 y.o. male patient seen and examined face to face by this provider, and chart reviewed. Patient present with his wife and gives verbal consent for his wife [name] to participate in the assessment. On evaluation, patient is alert and oriented x4. His thought process is logical, and speech is coherent. His mood is depressed, and affect is congruent. He is casually dressed and well groomed. He reports struggling with suicidal ideations passively x6 months.  He reports that for the past 4 to 5 weeks the suicidal ideations have worsened. He reports that for the past two to three the suicidal ideations have become persistent. He reports no suicidal plan or intent.  He denies that he is suicidal ideations at this time. He denies homicidal ideations. He denies auditory and visual hallucinations. He does not appear to be responding to internal or external stimuli. He reports some paranoia about his coworkers where he feels that his boss only likes gay people and women. He reports feeling depressed since November and describes his depressive symptoms as sadness, worthlessness,  hopelessness, decreased energy level, crying spells, irritability, and a lack of interest in activities. He reports that he came in for an evaluation because him and his wife were arguing, which turned into yelling and shouting and that caused him to have a panic attack for the first time. He describes the panic attack as collapsing to the ground, crying and shaking.  He identifies life stressors as work, marital issues, and world events.  He states that he attends medication management with [facility] and therapy with [place] [facility]. His next appt with [facility] is on 02/03/21.   Patient was offered to stay at the [facility] facility based crisis for safety and mood stabilization. Both the patient and his wife declined treatment at the [facility]. The patient's wife states that they were able to discuss a safety plan and the patient will go and stay with his parents for a couple days. The patient agrees to the stated plan. Both the patient and his wife agreed to remove any weapons from the home including knives. Patient states that he does not have access to guns.  The patient's wife [name] states that she will lock up all medications as well. The patient states if he develops suicidal thoughts he will contact his wife, talk to his parents, or reach out to his therapist. We discussed the patient starting Vistaril 25 mg 3 times a day as needed for severe anxiety. We discussed the benefits and side effects to taking Vistaril.  he patient verbalizes understanding.  he patient was provided with Vistaril 25 mg x 12 tablets.  The patient was advised to follow up with his psychiatrist for a medication refill next week.  Psychiatric Specialty Exam  Presentation  General Appearance:Appropriate for Environment  Eye Contact:Fair  Speech:Clear and Coherent  Speech Volume:Normal  Handedness:No data recorded  Mood and Affect  Mood:Depressed  Affect:Congruent   Thought  Process  Thought Processes:Coherent; Goal Directed  Descriptions of Associations:Intact  Orientation:Full (Time, Place and Person)  Thought Content:WDL  Diagnosis of Schizophrenia or Schizoaffective disorder in past: No   Hallucinations:None  Ideas of Reference:None  Suicidal Thoughts:Yes, Passive  Homicidal Thoughts:No   Sensorium  Memory:Immediate Fair; Recent Fair; Remote Fair  Judgment:Fair  Insight:Fair   Executive Functions  Concentration:Fair  Attention Span:Fair  Recall:Fair  Fund of Knowledge:Fair  Language:Fair   Psychomotor Activity  Psychomotor Activity:Normal   Assets  Assets:Communication Skills; Desire for Improvement; Financial Resources/Insurance; Housing; Intimacy; Leisure Time; Physical Health; Social Support; Vocational/Educational   Sleep  Sleep:Fair  Number of hours: 6   Nutritional Assessment (For OBS and FBC admissions only) Has the patient had a weight loss or gain of 10 pounds or more in the last 3 months?: No Has the patient had a decrease in food intake/or appetite?: No Does the patient have dental problems?: No Does the patient have eating habits or behaviors that may be indicators of an eating disorder including binging or inducing vomiting?: No Has the patient recently lost weight without trying?: 0 Has the patient been eating poorly because of a decreased appetite?: 0 Malnutrition Screening Tool Score: 0    Physical Exam: Physical Exam HENT:     Head: Normocephalic.     Nose: Nose normal.  Eyes:     Conjunctiva/sclera: Conjunctivae normal.  Cardiovascular:     Rate and Rhythm: Normal rate.  Pulmonary:     Effort: Pulmonary effort is normal.  Musculoskeletal:        General: Normal range of motion.     Cervical back: Normal range of motion.  Neurological:     Mental Status: He is alert and oriented to person, place, and time.   Review of Systems  Constitutional: Negative.   HENT: Negative.    Eyes:  Negative.   Respiratory: Negative.    Cardiovascular: Negative.   Gastrointestinal: Negative.   Genitourinary: Negative.   Musculoskeletal: Negative.   Skin: Negative.   Neurological: Negative.   Endo/Heme/Allergies: Negative.   Psychiatric/Behavioral:  Positive for depression and suicidal ideas. The patient is nervous/anxious.   Blood pressure 114/78, pulse 80, temperature 97.8 F (36.6 C), resp. rate 18, height 5\' 3"  (1.6 m), weight 206 lb (93.4 kg), SpO2 98 %. Body mass index is 36.49 kg/m.  Musculoskeletal: Strength & Muscle Tone: within normal limits Gait & Station: normal Patient leans: N/A   [facility] MSE Discharge Disposition for Follow up and Recommendations: Based on my evaluation the patient does not appear to have an emergency medical condition and can be discharged with resources and follow up care in outpatient services for Medication Management, Individual Therapy, and Group Therapy   Follow-up Information     [facility]. Go on 02/03/2021.   Why: for follow-up with psychiatry. Contact information: [address], [address] [place]                [id number], NP 01/06/2021, 5:42 PM

## 2021-01-06 NOTE — BH Assessment (Signed)
Comprehensive Clinical Assessment (CCA) Note  01/06/2021 Mark Fritz [id number]  DISPOSITION: Per Mark Nixon, NP, pt is recommended for [facility].   The patient demonstrates the following risk factors for suicide: Chronic risk factors for suicide include: psychiatric disorder of MDD . Acute risk factors for suicide include: family or marital conflict. Protective factors for this patient include: positive social support, positive therapeutic relationship, responsibility to others (children, family), and hope for the future. Considering these factors, the overall suicide risk at this point appears to be high. Patient is appropriate for outpatient follow up.  Flowsheet Row ED from 01/06/2021 in [facility]  C-SSRS RISK CATEGORY High Risk      Pt is a 35 yo male presenting voluntarily accompanied by his wife, Mark Fritz. Wife participated in the assessment with pt's permission. Pt reported increasing SI with mental development of plans and increasing feelings that he might follow through. Pt does say that love of his family keeps him from attempting. Pt reported issues with anxiety and ADHD as well. Pt and pt's wife do not feel safe and were referred her after he had an appointment with his NP Mark Fritz at [facility]) for medication management earlier today. Pt has a therapist at [facility].Maxwell Fritz) who they say has expressed concern too. Pt denies HI, AVH, paranoia and any drug/alcohol use. Pt owns guns but he does not have access to them now. Pt reported that he has been diagnosed with ADHD and is prescribed medication. Pt seemed fearful and anxious although he tried to cover it with humor. Pt reported several outdoor sports that he enjoys but added that he does not participate due to lack of motivation. Pt stated he sleeps about 5-6 hours per night. Pt has been married for 14 years but there may be some conflict in the marriage.  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   Chief Complaint:  Chief Complaint  Patient presents with   Suicidal   ADHD   Anxiety   Visit Diagnosis:  MDD, Recurrent, Severe GAD ADHD    CCA Screening, Triage and Referral (STR)  Patient Reported Information How did you hear about [place]? Other (Comment) (Referred by NP who manages his medications and his therapist.)  What Is the Reason for Your Visit/Call Today? Pt is a 35 yo male presenting voluntarily accompanied by his wife, Mark Fritz. Wife participated in the assessment with pt's permission. Pt reported increasing SI with mental development of plans and increasing feelings that he might follow through. Pt does say that love of his family keeps him from attempting. Pt reported issues with anxiety and ADHD as well. Pt and pt's wife do not feel safe and were referred her after he had an appointment with his NP for medication management earlier today. Pt has a therapist at [employer] [facility]. who they say has expressed concern too. Pt denies HI, AVH, paranoia and any drug/alcohol use. Pt owns guns but he does not have access to them now.  How Long Has This Been Causing You Problems? 1-6 months  What Do You Feel Would Help You the Most Today? Treatment for Depression or other mood problem   Have You Recently Had Any Thoughts About Hurting Yourself? Yes  Are You Planning to Commit Suicide/Harm Yourself At This time? Yes   Have you Recently Had Thoughts About Hurting Someone [name]? No  Are You Planning to Harm Someone at This Time? No  Explanation: No data  recorded  Have You Used Any Alcohol or Drugs in the Past 24 Hours? No  How Long Ago Did You Use Drugs or Alcohol? No data recorded What Did You Use and How Much? No data recorded  Do You Currently Have a Therapist/Psychiatrist? No data recorded Name of Therapist/Psychiatrist: No data recorded  Have You Been Recently Discharged From Any Office Practice or Programs? No data recorded Explanation of  Discharge From Practice/Program: No data recorded    CCA Screening Triage Referral Assessment Type of Contact: No data recorded Telemedicine Service Delivery:   Is this Initial or Reassessment? No data recorded Date Telepsych consult ordered in [facility]:  No data recorded Time Telepsych consult ordered in [facility]:  No data recorded Location of Assessment: No data recorded Provider Location: No data recorded  Collateral Involvement: No data recorded  Does Patient Have a Court Appointed Legal Guardian? No data recorded Name and Contact of Legal Guardian: No data recorded If Minor and Not Living with Parent(s), Who has Custody? No data recorded Is CPS involved or ever been involved? No data recorded Is APS involved or ever been involved? No data recorded  Patient Determined To Be At Risk for Harm To Self or Others Based on Review of Patient Reported Information or Presenting Complaint? No data recorded Method: No data recorded Availability of Means: No data recorded Intent: No data recorded Notification Required: No data recorded Additional Information for Danger to Others Potential: No data recorded Additional Comments for Danger to Others Potential: No data recorded Are There Guns or Other Weapons in Your Home? No data recorded Types of Guns/Weapons: No data recorded Are These Weapons Safely Secured?                            No data recorded Who Could Verify You Are Able To Have These Secured: No data recorded Do You Have any Outstanding Charges, Pending Court Dates, Parole/Probation? No data recorded Contacted To Inform of Risk of Harm To Self or Others: No data recorded   Does Patient Present under Involuntary Commitment? No data recorded IVC Papers Initial File Date: No data recorded  [place] of Residence: No data recorded  Patient Currently Receiving the Following Services: No data recorded  Determination of Need: Urgent (48 hours)   Options For Referral: No data  recorded    CCA Biopsychosocial Patient Reported Schizophrenia/Schizoaffective Diagnosis in Past: No   Strengths: intellect and love for his family   Mental Health Symptoms Depression:   Change in energy/activity; Difficulty Concentrating; Fatigue; Hopelessness; Increase/decrease in appetite; Irritability; Sleep (too much or little); Tearfulness; Worthlessness   Duration of Depressive symptoms:  Duration of Depressive Symptoms: Greater than two weeks   Mania:   None   Anxiety:    Difficulty concentrating; Fatigue; Irritability; Restlessness; Sleep; Tension; Worrying   Psychosis:   None   Duration of Psychotic symptoms:    Trauma:   None   Obsessions:   None   Compulsions:   None   Inattention:   None   Hyperactivity/Impulsivity:   Fidgets with hands/feet; Feeling of restlessness (Pt reported that he has been diagnosed with ADHD and is prescribed medication.)   Oppositional/Defiant Behaviors:   N/A   Emotional Irregularity:   Mood lability; Recurrent suicidal behaviors/gestures/threats; Potentially harmful impulsivity   Other Mood/Personality Symptoms:  No data recorded   Mental Status Exam Appearance and self-care  Stature:   Average   Weight:   Average weight  Clothing:   Casual   Grooming:   Normal   Cosmetic use:   None   Posture/gait:   Normal   Motor activity:   Restless   Sensorium  Attention:   Normal   Concentration:   Normal; Preoccupied (Pt seemed fearful and anxious although he tried to cover it with humor.)   Orientation:   Person; Place; Situation; Time   Recall/memory:   Normal   Affect and Mood  Affect:   Anxious; Blunted   Mood:   Anxious; Depressed; Hopeless; Worthless   Relating  Eye contact:   Normal   Facial expression:   Responsive   Attitude toward examiner:   Cooperative   Thought and Language  Speech flow:  Clear and Coherent; Normal   Thought content:   Appropriate to Mood and  Circumstances   Preoccupation:   Suicide   Hallucinations:   None   Organization:  No data recorded  Affiliated Computer ServicesExecutive Functions  Fund of Knowledge:   Average   Intelligence:   Average   Abstraction:   Normal   Judgement:   Fair   Reality Testing:   Adequate   Insight:   Fair   Decision Making:   Impulsive   Social Functioning  Social Maturity:   Impulsive   Social Judgement:   Normal   Stress  Stressors:   Family conflict; Work   Coping Ability:   Human resources officerverwhelmed   Skill Deficits:   Interpersonal; Self-care   Supports:   Family; Friends/Service system     Religion: Religion/Spirituality Are You A Religious Person?: Yes What is Your Religious Affiliation?: Christian  Leisure/Recreation: Leisure / Recreation Do You Have Hobbies?: Yes Leisure and Hobbies: Pt reported several outdoor sports that he enjoys but added that he does not participate due to lack of motivation.  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  Education: Education Is Patient Currently Attending School?: No Last Grade Completed: 12 (Pt completed law school.) Did You Attend College?: Yes What Type of College Degree Do you Have?: JD Did You Have An Individualized Education Program (IIEP): No Did You Have Any Difficulty At School?: No Patient's Education Has Been Impacted by Current Illness: No   CCA Family/Childhood History Family and Relationship History: Family history Marital status: Married Number of Years Married: 14 Additional relationship information: There may be some conflict  in the marriage. Does patient have children?: Yes How many children?: 3 How is patient's relationship with their children?: good  Childhood History:  Childhood History By whom was/is the patient raised?: Both parents Did patient suffer any verbal/emotional/physical/sexual abuse as a child?: No Did patient suffer from severe childhood neglect?: No Has patient ever been sexually abused/assaulted/raped as an adolescent or adult?: No Was the patient ever a victim of a crime or a disaster?: No Witnessed domestic violence?: No Has patient been affected by domestic violence as an adult?: No  Child/Adolescent Assessment:     CCA Substance Use Alcohol/Drug Use: Alcohol / Drug Use Pain Medications: see MAR Prescriptions: see MAR Over the Counter: see MAR History of alcohol / drug use?: No history of alcohol / drug abuse (Pt  denies.)                         ASAM's:  Six Dimensions of Multidimensional Assessment  Dimension 1:  Acute Intoxication and/or Withdrawal Potential:      Dimension 2:  Biomedical Conditions and Complications:      Dimension 3:  Emotional, Behavioral, or Cognitive Conditions and Complications:     Dimension 4:  Readiness to Change:     Dimension 5:  Relapse, Continued use, or Continued Problem Potential:     Dimension 6:  Recovery/Living Environment:     ASAM Severity Score:    ASAM Recommended Level of Treatment:     Substance use Disorder (SUD)    Recommendations for Services/Supports/Treatments:    Discharge Disposition:    DSM5 Diagnoses: Patient Active Problem List   Diagnosis Date Noted   MDD (major depressive disorder), recurrent severe, without psychosis (HCC) 01/06/2021   Major depressive disorder, recurrent episode, moderate (HCC) 02/19/2018   De Quervain's tenosynovitis, right 03/19/2017   Mesenteric adenitis 03/07/2017   Constipation 03/07/2017   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

## 2021-01-06 NOTE — Progress Notes (Signed)
   01/06/21 1631  [facility] Triage Screening (Walk-ins at [facility] only)  How Did You Hear About [place]? Other (Comment) (Referred by NP who manages his medications and his therapist.)  What Is the Reason for Your Visit/Call Today? Pt is a 35 yo male presenting voluntarily accompanied by his wife, Mark Fritz. Wife participated in the assessment with pt's permission. Pt reported increasing SI with mental development of plans and increasing feelings that he might follow through. Pt does say that love of his family keeps him from attempting. Pt reported issues with anxiety and ADHD as well. Pt and pt's wife do not feel safe and were referred her after he had an appointment with his NP for medication management earlier today. Pt has a therapist at [facility]. who they say has expressed concern too. Pt denies HI, AVH, paranoia and any drug/alcohol use. Pt owns guns but he does not have access to them now.  How Long Has This Been Causing You Problems? 1-6 months  Have You Recently Had Any Thoughts About Hurting Yourself? Yes  How long ago did you have thoughts about hurting yourself? today  Are You Planning to Commit Suicide/Harm Yourself At This time? Yes  Have you Recently Had Thoughts About Hurting Someone [name]? No  Are You Planning To Harm Someone At This Time? No  Are you currently experiencing any auditory, visual or other hallucinations? No  Have You Used Any Alcohol or Drugs in the Past 24 Hours? No  Do you have any current medical co-morbidities that require immediate attention? No  Clinician description of patient physical appearance/behavior: Pt is casually dressed and adequately groomed. Pt was cooperative and polite.Pt's speech, movement and thought processes were within normal limtis. Pt's mood was depressed and anxious.  What Do You Feel Would Help You the Most Today? Treatment for Depression or other mood problem  If access to [facility] was not available, would you have sought care in the  Emergency Department? Yes  Determination of Need Urgent (48 hours)  Mark Fritz T. Mark Norman, MS, [facility] Triage Specialist [facility]

## 2021-01-06 NOTE — Discharge Instructions (Signed)

## 2021-01-06 NOTE — Progress Notes (Signed)
Mark Fritz received his AVS, questions answered and he retrieved his personal belongings. He was escorted to the lobby without incident.

## 2021-01-06 NOTE — Progress Notes (Signed)
[name] [id number] 1985-06-26 35 y.o.  Subjective:   Patient ID:  Mark Fritz is a 35 y.o. (DOB [birth year]) male.  Chief Complaint:  Chief Complaint  Patient presents with   Anxiety   Depression    HPI [name] presents to the office today for follow-up of depression and anxiety. He reports, "my marriage may be dead, so I want to be." He reports having passive death wishes and intermittent suicidal thoughts. Denies suicidal intent. Contracts for safety. Barriers to suicide include family- "My family is the only reason I am still here."   He reports "I've been really angry today because I am hurt." Reports depressed mood. He reports that his anxiety is "always high." He reports that in times of intense anxiety he will experience "tunnel vision... almost like blacking out." He reports that he had some increase in sleep disturbance after medication increases and this is improving. Appetite is ok. Energy and motivation have been lower. Concentration is diminished. He reports that he has been falling behind at work.   Has started seeing an individual therapist in the same practice where they are having couples therapy. Reports that his parents are in town. Reports that he has requested several Fridays off and several days of surgery.    Past Psychiatric Medication Trials: Sertraline-Effective. Multiple adverse effects (initial GI side effects, insomnia, cognitive side effects, sexual side effects) Trintellix- Not effective and caused some "fuzzy head" Viibryd Wellbutrin- irritability Adderall- decreased appetite, increased irritability Evekeo- effective for attention deficit with out significant tolerability issues Lithium  PHQ2-9    Flowsheet Row Office Visit from 02/07/2017 in [facility]  PHQ-2 Total Score 4  PHQ-9 Total Score 15        Review of Systems:  Review of Systems  Musculoskeletal:  Negative for gait problem.  Neurological:   Positive for tremors.  Psychiatric/Behavioral:         Please refer to HPI   Medications: I have reviewed the patient's current medications.  Current Outpatient Medications  Medication Sig Dispense Refill   Amphetamine Sulfate (EVEKEO) 10 MG TABS Take 10 mg by mouth every morning AND 5 mg daily at 12 noon. 45 tablet 0   Amphetamine Sulfate (EVEKEO) 10 MG TABS Take 10 mg by mouth every morning AND 5 mg daily at 12 noon. 45 tablet 0   [START ON 02/03/2021] Amphetamine Sulfate (EVEKEO) 10 MG TABS Take 10 mg by mouth every morning AND 5 mg daily after lunch. 45 tablet 0   doxylamine, Sleep, (UNISOM) 25 MG tablet Take 12.5 mg by mouth at bedtime as needed.      Famotidine (PEPCID PO) Take by mouth daily as needed. (Patient not taking: Reported on 12/15/2020)     lithium carbonate 150 MG capsule Take 3 capsules (450 mg total) by mouth at bedtime. 90 capsule 1   Vilazodone HCl 20 MG TABS Take 1 tablet (20 mg total) by mouth daily. 30 tablet 1   No current facility-administered medications for this visit.    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   Please see review of systems for further details on the patient's review from today.   Objective:   Physical Exam:  There were no vitals taken for this visit.  Physical Exam Constitutional:      General: He is not in acute distress. Musculoskeletal:        General: No deformity.  Neurological:     Mental Status: He is alert and oriented to person, place, and time.     Coordination: Coordination normal.  Psychiatric:        Attention and  Perception: Attention and perception normal. He does not perceive auditory or visual hallucinations.        Mood and Affect: Mood is anxious and depressed. Affect is not labile, blunt, angry or inappropriate.        Speech: Speech normal.        Behavior: Behavior normal.        Thought Content: Thought content is not paranoid or delusional. Thought content includes suicidal ideation. Thought content does not include homicidal ideation. Thought content does not include homicidal plan.        Cognition and Memory: Cognition and memory normal.        Judgment: Judgment normal.     Comments: Insight intact Reports intermittent suicidal thoughts without intent. Contracts for safety. Exhibits some forward thinking, ie talking about upcoming surgery, increasing frequency of therapy visits, taking some future Fridays off from work, etc,    Lab Review:     Component Value Date/Time   NA 138 07/10/2017 0859   K 4.2 07/10/2017 0859   CL 101 07/10/2017 0859   CO2 30 07/10/2017 0859   GLUCOSE 107 (H) 07/10/2017 0859   BUN 18 07/10/2017 0859   CREATININE 1.04 07/10/2017 0859   CALCIUM 10.0 07/10/2017 0859   PROT 7.5 07/10/2017 0859   ALBUMIN 4.8 07/10/2017 0859   AST 21 07/10/2017 0859   ALT 30 07/10/2017 0859   ALKPHOS 49 07/10/2017 0859   BILITOT 0.5 07/10/2017 0859       Component Value Date/Time   WBC 5.4 07/10/2017 0859   RBC 5.39 07/10/2017 0859   HGB 15.6 07/10/2017 0859   HCT 45.0 07/10/2017 0859   PLT 250.0 07/10/2017 0859   MCV 83.5 07/10/2017 0859   MCHC 34.7 07/10/2017 0859   RDW 13.0 07/10/2017 0859   LYMPHSABS 2.1 07/10/2017 0859   MONOABS 0.4 07/10/2017 0859   EOSABS 0.1 07/10/2017 0859   BASOSABS 0.0 07/10/2017 0859    No results found for: POCLITH, LITHIUM   No results found for: PHENYTOIN, PHENOBARB, VALPROATE, CBMZ   .res Assessment: Plan:   Patient seen for 30 minutes and time spent discussing safety plan at length.  Patient contracts for safety and reports  that he would go to behavioral health urgent care center if he was experiencing suicidal intent or increased suicidal thoughts.  Patient also reports plan to increase frequency of individual psychotherapy in addition to marital therapy.  Discussed option of FMLA, either intermittent or continuous, and advised patient to notify provider if this may be needed or helpful in the future.  He reports that he does not want to go to an outpatient program that would involve group therapy.  He declines need for inpatient hospitalization and reports, "I think I just need a break from all this right now." Encouraged him to consider options for taking a break with increased support, such as spending the weekend with his parents or other  family or friends that are supportive.  Offered to increase frequency of follow-up visits, however patient declines stating that medication changes are unlikely to be beneficial since mood and anxiety signs and symptoms are in response to marital stressors and believes that more frequent therapy visits would likely be most helpful.  Advised patient to contact office if he needs to be seen sooner. Patient request prescription for Evekeo.  Additional prescription sent in for a Evekeo 10 mg in the morning and 5 mg after lunch for attention deficit disorder. Reviewed options to add Rexulti were augmentation of depression and patient declines.  He reports that medication adjustments have occasionally been difficult in the past and does not want to add medication adjustment at this time. Continue Viibryd 20 mg daily for anxiety and depression. Continue lithium 450 mg at bedtime for mood signs and symptoms. Agree with plan to follow-up closely with individual and marital therapist. Patient follow-up with this provider in 4 weeks or sooner if clinically indicated. Patient advised to contact office with any questions, adverse effects, or acute worsening in signs and symptoms.   Cliford was seen  today for anxiety and depression.  Diagnoses and all orders for this visit:  Major depression, recurrent, chronic (HCC)  Attention deficit hyperactivity disorder (ADHD), combined type -     Amphetamine Sulfate (EVEKEO) 10 MG TABS; Take 10 mg by mouth every morning AND 5 mg daily after lunch.  Generalized anxiety disorder    Please see After Visit Summary for patient specific instructions.  Future Appointments  Date Time Provider Department Center  02/03/2021  9:00 AM Corie Chiquito, PMHNP [facility] None    No orders of the defined types were placed in this encounter.   -------------------------------

## 2021-01-08 ENCOUNTER — Other Ambulatory Visit: Payer: Self-pay | Admitting: Psychiatry

## 2021-01-08 DIAGNOSIS — F339 Major depressive disorder, recurrent, unspecified: Secondary | ICD-10-CM

## 2021-01-13 ENCOUNTER — Telehealth: Payer: Self-pay | Admitting: Psychiatry

## 2021-01-13 NOTE — Telephone Encounter (Signed)
Received FMLA Form. Placed in Traci's box 9/16.

## 2021-01-17 ENCOUNTER — Telehealth: Payer: Self-pay | Admitting: Psychiatry

## 2021-01-17 DIAGNOSIS — G4700 Insomnia, unspecified: Secondary | ICD-10-CM

## 2021-01-17 MED ORDER — HYDROXYZINE PAMOATE 25 MG PO CAPS: 25.0000 mg | ORAL_CAPSULE | Freq: Every evening | ORAL | 2 refills | Status: DC | PRN

## 2021-01-17 NOTE — Telephone Encounter (Signed)
Next visit is 02/03/21. Mark Fritz dropped off his FMLA form this past Friday to be completed. He is not sure if he needs to take it though. Reason being he went to his parents this weekend and it took away his stress significantly. His parents live down the street from him. He thinks if he visits his parents periodically, this will help with his stress and his work place. Could someone please call him at [phone].

## 2021-01-17 NOTE — Telephone Encounter (Signed)
Please advise if pt should still take FMLA

## 2021-01-17 NOTE — Telephone Encounter (Signed)
He has been staying with his parents and is preparing for surgery tomorrow. He reports that his family has COVID. He reports that he is now recognizing anxiety has been a larger component to his s/s and this has been helpful with his approach. He reports that he is now looking at how to manage his stressors.  He reports that he plans on using time off and no longer feeling that he needs FMLA. Will hold off on completing FMLA at this time.

## 2021-01-17 NOTE — Telephone Encounter (Signed)
He reports that hydroxyzine has been effective for his insomnia. Will send in script for hydroxyzine.

## 2021-01-18 DIAGNOSIS — H5440 Blindness, one eye, unspecified eye: Secondary | ICD-10-CM | POA: Diagnosis not present

## 2021-01-18 DIAGNOSIS — H54415A Blindness right eye category 5, normal vision left eye: Secondary | ICD-10-CM | POA: Diagnosis not present

## 2021-01-18 DIAGNOSIS — H5711 Ocular pain, right eye: Secondary | ICD-10-CM | POA: Diagnosis not present

## 2021-01-18 DIAGNOSIS — E669 Obesity, unspecified: Secondary | ICD-10-CM | POA: Diagnosis not present

## 2021-01-18 DIAGNOSIS — Z6831 Body mass index (BMI) 31.0-31.9, adult: Secondary | ICD-10-CM | POA: Diagnosis not present

## 2021-01-18 DIAGNOSIS — H5710 Ocular pain, unspecified eye: Secondary | ICD-10-CM | POA: Diagnosis not present

## 2021-01-18 NOTE — Telephone Encounter (Signed)
Will hold per Shanda Bumps and pt

## 2021-01-19 NOTE — Telephone Encounter (Signed)
reviewed

## 2021-01-31 DIAGNOSIS — F419 Anxiety disorder, unspecified: Secondary | ICD-10-CM | POA: Diagnosis not present

## 2021-02-03 ENCOUNTER — Other Ambulatory Visit: Payer: Self-pay

## 2021-02-03 ENCOUNTER — Encounter: Payer: Self-pay | Admitting: Psychiatry

## 2021-02-03 ENCOUNTER — Ambulatory Visit (INDEPENDENT_AMBULATORY_CARE_PROVIDER_SITE_OTHER): Payer: BC Managed Care – PPO | Admitting: Psychiatry

## 2021-02-03 DIAGNOSIS — F339 Major depressive disorder, recurrent, unspecified: Secondary | ICD-10-CM | POA: Diagnosis not present

## 2021-02-03 DIAGNOSIS — F411 Generalized anxiety disorder: Secondary | ICD-10-CM | POA: Diagnosis not present

## 2021-02-03 MED ORDER — LITHIUM CARBONATE 150 MG PO CAPS: 450.0000 mg | ORAL_CAPSULE | Freq: Every day | ORAL | 0 refills | Status: DC

## 2021-02-03 MED ORDER — PROPRANOLOL HCL 10 MG PO TABS: ORAL_TABLET | ORAL | 1 refills | Status: DC

## 2021-02-03 MED ORDER — AMPHETAMINE SULFATE 10 MG PO TABS: ORAL_TABLET | ORAL | 0 refills | Status: DC

## 2021-02-03 MED ORDER — VILAZODONE HCL 20 MG PO TABS: 20.0000 mg | ORAL_TABLET | Freq: Every day | ORAL | 0 refills | Status: DC

## 2021-02-03 NOTE — Progress Notes (Signed)
Mark Fritz [id number] 11-14-1985 35 y.o.  Subjective:   Patient ID:  Mark Fritz is a 35 y.o. (DOB 1985-09-28) male.  Chief Complaint:  Chief Complaint  Patient presents with   Anxiety   Depression    HPI Mark Fritz presents to the office today for follow-up of anxiety, depression, and ADD. He reports that the day of his surgery was difficult. He reports "it has been frustrating not to be able to exercise" and today was the first day he has been able to exercise. He reports that he has had more pain than expected.   He reports that anxiety has "been pretty bad." He reports that his anxiety occ manifests with irritability and anger. Reports that he has increased anxiety with not being able to exercise. He reports feeling "nervous, coupled with overwhelmed." Denies any recent panic attacks. He reports some sadness in response to feeling "unfulfilled...not engaged." Sleep has been improved with Hydroxyzine. Difficult for him to assess energy and motivation due to post-surgical restrictions. Motivations is fair and is meeting responsibilities for work. Appetite has varied due to GI side effects from ibuprofen. Some difficulty with concentration due to distractibility. Denies SI.   He is back home and reports he and his wife are continuing to work on their relationship.   He reports that Hydroxyzine has been helpful for anxiety and typically only takes it at night due to drowsiness. Took Hydroxyzine prn before a work function and this was helpful for social anxiety.   Continues to see individual therapist and couples therapist.   Past Psychiatric Medication Trials: Sertraline-Effective. Multiple adverse effects (initial GI side effects, insomnia, cognitive side effects, sexual side effects) Trintellix- Not effective and caused some "fuzzy head" Viibryd Wellbutrin- irritability Adderall- decreased appetite, increased irritability Evekeo- effective for attention deficit with out  significant tolerability issues Lithium   PHQ2-9    Flowsheet Row Office Visit from 02/07/2017 in [facility]  PHQ-2 Total Score 4  PHQ-9 Total Score 15        Review of Systems:  Review of Systems  Eyes:  Positive for pain.  Gastrointestinal:        GI side effects with ibuprofen  Musculoskeletal:  Negative for gait problem.  Psychiatric/Behavioral:         Please refer to HPI   Medications: I have reviewed the patient's current medications.  Current Outpatient Medications  Medication Sig Dispense Refill   acetaminophen (TYLENOL) 325 MG tablet Take 650 mg by mouth every 6 (six) hours as needed.     Amphetamine Sulfate (EVEKEO) 10 MG TABS Take 10 mg by mouth every morning AND 5 mg daily after lunch. 45 tablet 0   [START ON 03/03/2021] Amphetamine Sulfate (EVEKEO) 10 MG TABS Take 1 tablet in the morning and 1/2 tablet mid-day 60 tablet 0   hydrOXYzine (VISTARIL) 25 MG capsule Take 1 capsule (25 mg total) by mouth at bedtime as needed for anxiety. 30 capsule 2   ibuprofen (ADVIL) 200 MG tablet Take 200 mg by mouth every 6 (six) hours as needed.     propranolol (INDERAL) 10 MG tablet Take 1-2 tabs po BID prn anxiety 120 tablet 1   doxylamine, Sleep, (UNISOM) 25 MG tablet Take 12.5 mg by mouth at bedtime as needed.  (Patient not taking: Reported on 02/03/2021)     lithium carbonate 150 MG capsule Take 3 capsules (450 mg total) by mouth at bedtime. 270 capsule 0   Vilazodone HCl 20 MG TABS  Take 1 tablet (20 mg total) by mouth daily. 90 tablet 0   No current facility-administered medications for this visit.    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   Please see review of systems for further details on the patient's review from today.   Objective:   Physical Exam:  BP 123/88   Pulse (!) 105   Physical Exam Constitutional:      General: He is not in acute distress. Musculoskeletal:        General: No deformity.  Neurological:     Mental Status: He is alert and oriented to person, place, and time.     Coordination: Coordination normal.  Psychiatric:        Attention and Perception: Attention and perception normal. He does not perceive auditory or visual hallucinations.        Mood and Affect: Mood is anxious. Mood is not depressed. Affect is not labile, blunt, angry or inappropriate.        Speech: Speech normal.        Behavior: Behavior normal.        Thought Content: Thought content normal. Thought content is not paranoid or delusional. Thought content does not include homicidal or suicidal ideation. Thought content does not include homicidal or suicidal plan.        Cognition and Memory: Cognition and memory normal.        Judgment: Judgment normal.     Comments: Insight intact    Lab Review:     Component Value Date/Time   NA 138 07/10/2017 0859   K 4.2 07/10/2017 0859   CL 101 07/10/2017 0859   CO2 30 07/10/2017 0859   GLUCOSE 107 (H) 07/10/2017 0859   BUN 18 07/10/2017 0859   CREATININE 1.04 07/10/2017 0859   CALCIUM 10.0 07/10/2017 0859   PROT 7.5 07/10/2017 0859   ALBUMIN 4.8 07/10/2017 0859   AST 21 07/10/2017 0859   ALT 30 07/10/2017 0859   ALKPHOS 49 07/10/2017 0859   BILITOT 0.5 07/10/2017 0859       Component Value Date/Time   WBC 5.4 07/10/2017 0859   RBC 5.39 07/10/2017 0859   HGB 15.6 07/10/2017 0859   HCT 45.0 07/10/2017 0859   PLT 250.0 07/10/2017 0859   MCV 83.5 07/10/2017 0859   MCHC 34.7 07/10/2017 0859   RDW 13.0 07/10/2017 0859   LYMPHSABS 2.1 07/10/2017 0859    MONOABS 0.4 07/10/2017 0859   EOSABS 0.1 07/10/2017 0859   BASOSABS 0.0 07/10/2017 0859    No results found for: POCLITH, LITHIUM   No results found for: PHENYTOIN, PHENOBARB, VALPROATE, CBMZ   .res Assessment: Plan:   Discussed potential benefits, risks, and side effects of Propranolol. Discussed that Propranolol can be used as needed for anxiety signs and symptoms. Patient agrees to trial of Propranolol. Will start Propranolol 10 mg 1-2 tabs po BID prn anxiety.  He reports that he may also try using Hydroxyzine prn anxiety during the daytime if excessive drowsiness does not occur.  Discussed that Viibryd dose  could be increased to improve anxiety s/s. Pt reports that he prefers to continue Viibryd 20 mg po qd and try using other medications on an as needed basis.  Continue Lithium 450 mg at bedtime for mood s/s. Continue Evekeo 10 mg po q am and 5 mg in the afternoon for ADHD. Recommend continuing psychotherapy.  Pt to follow-up in 3 months or sooner if clinically indicated.  Patient advised to contact office with any questions, adverse effects, or acute worsening in signs and symptoms.  Mark Fritz was seen today for anxiety and depression.  Diagnoses and all orders for this visit:  Major depression, recurrent, chronic (HCC) -     lithium carbonate 150 MG capsule; Take 3 capsules (450 mg total) by mouth at bedtime. -     Vilazodone HCl 20 MG TABS; Take 1 tablet (20 mg total) by mouth daily.  Generalized anxiety disorder -     Vilazodone HCl 20 MG TABS; Take 1 tablet (20 mg total) by mouth daily.  Other orders -     Amphetamine Sulfate (EVEKEO) 10 MG TABS; Take 1 tablet in the morning and 1/2 tablet mid-day -     propranolol (INDERAL) 10 MG tablet; Take 1-2 tabs po BID prn anxiety    Please see After Visit Summary for patient specific instructions.  Future Appointments  Date Time Provider Department Center  05/05/2021  8:30 AM Corie Chiquito, PMHNP [facility] None    No orders of the  defined types were placed in this encounter.   -------------------------------

## 2021-02-09 ENCOUNTER — Other Ambulatory Visit: Payer: Self-pay | Admitting: Psychiatry

## 2021-02-09 DIAGNOSIS — G4700 Insomnia, unspecified: Secondary | ICD-10-CM

## 2021-02-10 DIAGNOSIS — F419 Anxiety disorder, unspecified: Secondary | ICD-10-CM | POA: Diagnosis not present

## 2021-02-28 ENCOUNTER — Other Ambulatory Visit: Payer: Self-pay | Admitting: Psychiatry

## 2021-03-03 DIAGNOSIS — F419 Anxiety disorder, unspecified: Secondary | ICD-10-CM | POA: Diagnosis not present

## 2021-03-08 DIAGNOSIS — F419 Anxiety disorder, unspecified: Secondary | ICD-10-CM | POA: Diagnosis not present

## 2021-03-13 DIAGNOSIS — F419 Anxiety disorder, unspecified: Secondary | ICD-10-CM | POA: Diagnosis not present

## 2021-03-14 ENCOUNTER — Telehealth: Payer: Self-pay | Admitting: Psychiatry

## 2021-03-14 ENCOUNTER — Other Ambulatory Visit: Payer: Self-pay | Admitting: Psychiatry

## 2021-03-14 DIAGNOSIS — F411 Generalized anxiety disorder: Secondary | ICD-10-CM

## 2021-03-14 DIAGNOSIS — F339 Major depressive disorder, recurrent, unspecified: Secondary | ICD-10-CM

## 2021-03-14 MED ORDER — VILAZODONE HCL 20 MG PO TABS: 30.0000 mg | ORAL_TABLET | Freq: Every day | ORAL | 0 refills | Status: DC

## 2021-03-14 NOTE — Telephone Encounter (Signed)
Next visit is 05/05/21. Mark Fritz called stating that the Vybrid is helping him and he is tolerating very well but says that occasionally he can get his boat rocked with anxiety. Could he make a small increase on his Vybrid? His phone number is [phone].

## 2021-03-14 NOTE — Telephone Encounter (Signed)
Pt stated its working well but there are times out of the blue that he will get suicidal ideations.He wants to increase

## 2021-03-14 NOTE — Telephone Encounter (Signed)
Recommend he increase Viibryd to 30 mg daily. They do not make a 30 mg, so he can take 1/2 of a 20 mg tablet. His insurance may require a prior authorization for this dose. [facility] to [facility] [address].

## 2021-03-14 NOTE — Telephone Encounter (Signed)
Requires PA, forwarded to Traci.

## 2021-03-14 NOTE — Telephone Encounter (Signed)
    Refill sent in for Viibryd - pharmacy states need PA - insurance will only pay for max 1/day.

## 2021-03-15 ENCOUNTER — Other Ambulatory Visit: Payer: Self-pay | Admitting: Psychiatry

## 2021-03-15 NOTE — Telephone Encounter (Signed)
LVM with info

## 2021-03-16 NOTE — Telephone Encounter (Signed)
That is correct a PA is required, it is pending

## 2021-03-16 NOTE — Telephone Encounter (Signed)
PA is pending due to quantity and medication

## 2021-03-17 NOTE — Telephone Encounter (Signed)
Prior Approval received for VILAZODONE 20 MG 1.5 TABS #135 effective 03/17/2021-03/17/2022 PA# [id number] with [employer] [place] XU#[id number]

## 2021-03-20 DIAGNOSIS — H02102 Unspecified ectropion of right lower eyelid: Secondary | ICD-10-CM | POA: Diagnosis not present

## 2021-03-20 DIAGNOSIS — Z970 Presence of artificial eye: Secondary | ICD-10-CM | POA: Diagnosis not present

## 2021-03-20 DIAGNOSIS — G43009 Migraine without aura, not intractable, without status migrainosus: Secondary | ICD-10-CM | POA: Diagnosis not present

## 2021-04-03 DIAGNOSIS — Z4420 Encounter for fitting and adjustment of artificial eye, unspecified: Secondary | ICD-10-CM | POA: Diagnosis not present

## 2021-04-05 ENCOUNTER — Other Ambulatory Visit: Payer: Self-pay | Admitting: Psychiatry

## 2021-04-05 DIAGNOSIS — F902 Attention-deficit hyperactivity disorder, combined type: Secondary | ICD-10-CM

## 2021-04-05 NOTE — Telephone Encounter (Signed)
Patient lm stating his [facility] Rx is not available at the [facility] [address] they are out of stock. He is requesting the office to cancel that location and [facility] [address] [facility] available. Patient stated he only has 3 more days worth. He is requesting a notification on the status as soon as you have complied with the request.

## 2021-04-06 MED ORDER — AMPHETAMINE SULFATE 10 MG PO TABS: ORAL_TABLET | ORAL | 0 refills | Status: DC

## 2021-04-06 NOTE — Telephone Encounter (Signed)
Please let him know script was sent

## 2021-04-06 NOTE — Telephone Encounter (Signed)
Patient notified

## 2021-04-17 DIAGNOSIS — F419 Anxiety disorder, unspecified: Secondary | ICD-10-CM | POA: Diagnosis not present

## 2021-05-05 ENCOUNTER — Ambulatory Visit: Payer: BC Managed Care – PPO | Admitting: Psychiatry

## 2021-05-11 ENCOUNTER — Other Ambulatory Visit: Payer: Self-pay

## 2021-05-11 ENCOUNTER — Telehealth: Payer: Self-pay | Admitting: Psychiatry

## 2021-05-11 DIAGNOSIS — F902 Attention-deficit hyperactivity disorder, combined type: Secondary | ICD-10-CM

## 2021-05-11 MED ORDER — AMPHETAMINE SULFATE 10 MG PO TABS: ORAL_TABLET | ORAL | 0 refills | Status: DC

## 2021-05-11 NOTE — Telephone Encounter (Signed)
Mark Fritz called to apologize formissing his last appt. He wants to RS but also will need a refill on Evekeo. [facility] [address]

## 2021-05-11 NOTE — Telephone Encounter (Signed)
Called back and made follow-up appt for 2/14

## 2021-05-11 NOTE — Telephone Encounter (Signed)
PENDED

## 2021-05-16 DIAGNOSIS — F419 Anxiety disorder, unspecified: Secondary | ICD-10-CM | POA: Diagnosis not present

## 2021-05-22 DIAGNOSIS — F419 Anxiety disorder, unspecified: Secondary | ICD-10-CM | POA: Diagnosis not present

## 2021-06-05 DIAGNOSIS — F419 Anxiety disorder, unspecified: Secondary | ICD-10-CM | POA: Diagnosis not present

## 2021-06-13 ENCOUNTER — Other Ambulatory Visit: Payer: Self-pay

## 2021-06-13 ENCOUNTER — Encounter: Payer: Self-pay | Admitting: Psychiatry

## 2021-06-13 ENCOUNTER — Ambulatory Visit (INDEPENDENT_AMBULATORY_CARE_PROVIDER_SITE_OTHER): Payer: BC Managed Care – PPO | Admitting: Psychiatry

## 2021-06-13 DIAGNOSIS — F411 Generalized anxiety disorder: Secondary | ICD-10-CM | POA: Diagnosis not present

## 2021-06-13 DIAGNOSIS — F902 Attention-deficit hyperactivity disorder, combined type: Secondary | ICD-10-CM | POA: Diagnosis not present

## 2021-06-13 DIAGNOSIS — F339 Major depressive disorder, recurrent, unspecified: Secondary | ICD-10-CM | POA: Diagnosis not present

## 2021-06-13 MED ORDER — AMPHETAMINE SULFATE 10 MG PO TABS: ORAL_TABLET | ORAL | 0 refills | Status: DC

## 2021-06-13 MED ORDER — VILAZODONE HCL 20 MG PO TABS: ORAL_TABLET | ORAL | 1 refills | Status: DC

## 2021-06-13 MED ORDER — PROPRANOLOL HCL 10 MG PO TABS: ORAL_TABLET | ORAL | 0 refills | Status: DC

## 2021-06-13 NOTE — Progress Notes (Signed)
Mark Fritz [id number] 1985-08-05 35 y.o.  Subjective:   Patient ID:  Mark Fritz is a 36 y.o. (DOB Feb 07, 1986) male.  Chief Complaint:  Chief Complaint  Patient presents with   Anxiety    HPI Mark Fritz presents to the office today for follow-up of anxiety, depression, and ADHD. He reports that "things have been better since October... it's significantly better." He reports that he has had some anxiety at times, such as when he has to go to [place] for work. He reports that Propranolol prn is helpful for anxiety but has blurred vision which causes difficulty with being able to see. He reports that he notices he will avoid tasks that he is anxious about. He reports that he has physical s/s with anxiety at times. "I think I am anxious all the time." He reports feeling that he was on the verge of a panic attack Sunday when it was hot and crowded at church. He reports some rumination and it is difficult to redirect these thoughts. He reports that depression has been better. "I think I am just exhausted from the worry." Emotional energy has been low. Motivation has been "better." He reports that anxiety will interfere with staying focused and productive. He reports that he has been having difficulty with concentration and felt "scrambled." Easily distracted by loud noises. Sleep "can be eratic, but it's normally pretty good." Appetite has been good. Denies SI.   He reports that he has seen limited improvement with increase in Viibryd.  He stopped taking Lithium due to hand tremor.  He reports that processing things with new therapist has been helpful.   Looking into options to reduce work stress.  Past Psychiatric Medication Trials: Sertraline-Effective. Multiple adverse effects (initial GI side effects, insomnia, cognitive side effects, sexual side effects) Trintellix- Not effective and caused some "fuzzy head" Viibryd Wellbutrin- irritability Adderall- decreased appetite, increased  irritability Evekeo- effective for attention deficit with out significant tolerability issues Lithium Hydroxyzine- causing excessive drowsiness  PHQ2-9    Flowsheet Row Office Visit from 02/07/2017 in [facility]  PHQ-2 Total Score 4  PHQ-9 Total Score 15        Review of Systems:  Review of Systems  Cardiovascular:  Negative for palpitations.  Gastrointestinal: Negative.   Musculoskeletal:  Negative for gait problem.  Neurological:  Negative for tremors.  Psychiatric/Behavioral:         Please refer to HPI   Medications: I have reviewed the patient's current medications.  Current Outpatient Medications  Medication Sig Dispense Refill   acetaminophen (TYLENOL) 325 MG tablet Take 650 mg by mouth every 6 (six) hours as needed.     [START ON 07/11/2021] Amphetamine Sulfate (EVEKEO) 10 MG TABS Take 10 mg by mouth every morning AND 5 mg daily after lunch. 45 tablet 0   Amphetamine Sulfate (EVEKEO) 10 MG TABS Take 1 tablet in the morning and 1/2 tablet mid-day 60 tablet 0   doxylamine, Sleep, (UNISOM) 25 MG tablet Take 12.5 mg by mouth at bedtime as needed.  (Patient not taking: Reported on 02/03/2021)     ibuprofen (ADVIL) 200 MG tablet Take 200 mg by mouth every 6 (six) hours as needed.     propranolol (INDERAL) 10 MG tablet TAKE 1 TO 2 TABLETS BY MOUTH TWICE A DAY AS NEEDED FOR ANXIETY 120 tablet 0   Vilazodone HCl 20 MG TABS TAKE 1 AND 1/2 TABLETS DAILY BY MOUTH 135 tablet 1   No current facility-administered medications  for this visit.    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   Please see review of systems for further details on the patient's review from today.   Objective:   Physical Exam:  BP 124/82    Pulse (!) 102   Physical Exam Constitutional:      General: He is not in acute distress. Musculoskeletal:        General: No deformity.  Neurological:     Mental Status: He is alert and oriented to person, place, and time.     Coordination: Coordination normal.  Psychiatric:        Attention and Perception: Attention and perception normal. He does not perceive auditory or visual hallucinations.        Mood and Affect: Mood is anxious. Affect is not labile, blunt, angry or inappropriate.        Speech: Speech normal.        Behavior: Behavior normal.        Thought Content: Thought content normal. Thought content is not paranoid or delusional. Thought content does not include homicidal or suicidal ideation. Thought content does not include homicidal or suicidal plan.        Cognition and Memory: Cognition and memory normal.        Judgment: Judgment normal.     Comments: Insight intact Mood is less anxious and depressed compared to past exams    Lab Review:     Component Value Date/Time   NA 138 07/10/2017 0859   K 4.2 07/10/2017 0859   CL 101 07/10/2017 0859   CO2 30 07/10/2017 0859   GLUCOSE 107 (H) 07/10/2017 0859   BUN 18 07/10/2017 0859   CREATININE 1.04 07/10/2017 0859   CALCIUM 10.0 07/10/2017 0859   PROT 7.5 07/10/2017 0859   ALBUMIN 4.8 07/10/2017 0859   AST 21 07/10/2017 0859   ALT 30 07/10/2017 0859   ALKPHOS 49 07/10/2017 0859   BILITOT 0.5 07/10/2017 0859       Component Value Date/Time   WBC 5.4 07/10/2017 0859   RBC 5.39 07/10/2017 0859   HGB 15.6 07/10/2017 0859   HCT 45.0 07/10/2017 0859   PLT 250.0 07/10/2017 0859   MCV 83.5 07/10/2017 0859   MCHC 34.7 07/10/2017 0859   RDW 13.0 07/10/2017 0859   LYMPHSABS 2.1 07/10/2017 0859   MONOABS 0.4 07/10/2017 0859    EOSABS 0.1 07/10/2017 0859   BASOSABS 0.0 07/10/2017 0859    No results found for: POCLITH, LITHIUM   No results found for: PHENYTOIN, PHENOBARB, VALPROATE, CBMZ   .res Assessment: Plan:   Pt seen for 30 minutes and time spent discussing possible treatment options for anxiety. Discussed potential benefits, risks, and side effects of Seroquel, Rexulti, and increasing Viibryd. Pt agrees to increase in Viibryd since he has experienced some improvement in mood and anxiety with lower dose of Viibryd and has been tolerating it without difficulty.  Will increase Viibryd to 30 mg po qd for depression and anxiety.  Continue Evekeo 10 mg in the morning  and 1/2 tablet mid-day for ADHD.  Continue Propranolol 10 mg 1-2 tabs po BID prn anxiety.  Recommend continuing psychotherapy.  Pt to follow-up in 6 weeks or sooner if clinically indicated.  Patient advised to contact office with any questions, adverse effects, or acute worsening in signs and symptoms.   Nithin was seen today for anxiety.  Diagnoses and all orders for this visit:  Attention deficit hyperactivity disorder (ADHD), combined type -     Amphetamine Sulfate (EVEKEO) 10 MG TABS; Take 10 mg by mouth every morning AND 5 mg daily after lunch. -     Amphetamine Sulfate (EVEKEO) 10 MG TABS; Take 1 tablet in the morning and 1/2 tablet mid-day  Major depression, recurrent, chronic (HCC) -     Vilazodone HCl 20 MG TABS; TAKE 1 AND 1/2 TABLETS DAILY BY MOUTH  Generalized anxiety disorder -     Vilazodone HCl 20 MG TABS; TAKE 1 AND 1/2 TABLETS DAILY BY MOUTH -     propranolol (INDERAL) 10 MG tablet; TAKE 1 TO 2 TABLETS BY MOUTH TWICE A DAY AS NEEDED FOR ANXIETY     Please see After Visit Summary for patient specific instructions.  Future Appointments  Date Time Provider Department Center  07/25/2021  1:00 PM Corie Chiquito, PMHNP [facility] None    No orders of the defined types were placed in this  encounter.   -------------------------------

## 2021-06-14 ENCOUNTER — Other Ambulatory Visit: Payer: Self-pay | Admitting: Psychiatry

## 2021-06-14 DIAGNOSIS — F411 Generalized anxiety disorder: Secondary | ICD-10-CM

## 2021-06-14 DIAGNOSIS — F339 Major depressive disorder, recurrent, unspecified: Secondary | ICD-10-CM

## 2021-06-19 DIAGNOSIS — F419 Anxiety disorder, unspecified: Secondary | ICD-10-CM | POA: Diagnosis not present

## 2021-07-03 DIAGNOSIS — F419 Anxiety disorder, unspecified: Secondary | ICD-10-CM | POA: Diagnosis not present

## 2021-07-17 DIAGNOSIS — F419 Anxiety disorder, unspecified: Secondary | ICD-10-CM | POA: Diagnosis not present

## 2021-07-25 ENCOUNTER — Encounter: Payer: Self-pay | Admitting: Psychiatry

## 2021-07-25 ENCOUNTER — Ambulatory Visit (INDEPENDENT_AMBULATORY_CARE_PROVIDER_SITE_OTHER): Payer: BC Managed Care – PPO | Admitting: Psychiatry

## 2021-07-25 ENCOUNTER — Other Ambulatory Visit: Payer: Self-pay

## 2021-07-25 DIAGNOSIS — F411 Generalized anxiety disorder: Secondary | ICD-10-CM | POA: Diagnosis not present

## 2021-07-25 DIAGNOSIS — F339 Major depressive disorder, recurrent, unspecified: Secondary | ICD-10-CM | POA: Diagnosis not present

## 2021-07-25 DIAGNOSIS — F902 Attention-deficit hyperactivity disorder, combined type: Secondary | ICD-10-CM

## 2021-07-25 MED ORDER — AMPHETAMINE SULFATE 10 MG PO TABS: ORAL_TABLET | ORAL | 0 refills | Status: DC

## 2021-07-25 MED ORDER — VILAZODONE HCL 40 MG PO TABS: 40.0000 mg | ORAL_TABLET | Freq: Every day | ORAL | 2 refills | Status: DC

## 2021-07-25 MED ORDER — PROPRANOLOL HCL 10 MG PO TABS: ORAL_TABLET | ORAL | 1 refills | Status: DC

## 2021-07-25 NOTE — Progress Notes (Signed)
Mark BourbonBryce R Fritz ?[id number] ?1986/02/25 ?36 y.o. ? ?Subjective:  ? ?Patient ID:  Mark Fritz is a 36 y.o. (DOB 1986/02/25) male. ? ?Chief Complaint:  ?Chief Complaint  ?Patient presents with  ? Anxiety  ? Depression  ? ? ?Anxiety ? ? ? ?Depression ?       Past medical history includes anxiety.   ?Mark Fritz presents to the office today for follow-up of anxiety and depression. He reports, "I'm doing pretty well." He reports that he has come to some realizations in therapy. He reports that he is having some mild sexual side effects at Viibryd 40 mg and did not have side effects at 30 mg. He reports he has seen some improvement in anxiety and depression with increased dose. He reports some continued mild depression. He reports that Propranolol prn has been helpful for work anxiety. Some possible mild affective dulling. He reports that motivation has been ok. Energy has been ok. He reports that anxiety is manageable. He notices occasional distractions with anxiety. Denies panic or physical s/s with anxiety. Sleeping ok. Concentration has been difficult. Appetite has been ok. Denies SI.  ? ?Has been working on increasing cardio.  ? ?Past Psychiatric Medication Trials: ?Sertraline-Effective. Multiple adverse effects (initial GI side effects, insomnia, cognitive side effects, sexual side effects) ?Trintellix- Not effective and caused some "fuzzy head" ?Viibryd- Some sexual side effects starting at 40 mg ?Wellbutrin- irritability ?Adderall- decreased appetite, increased irritability ?Evekeo- effective for attention deficit with out significant tolerability issues ?Lithium ?Hydroxyzine- causing excessive drowsiness ? ?PHQ2-9   ? ?Flowsheet Row Office Visit from [facility]/07/2017 in [facility]  ?PHQ-2 Total Score 4  ?PHQ-9 Total Score 15  ? ?  ?  ? ?Review of Systems:  ?Review of Systems  ?Musculoskeletal:  Negative for gait problem.  ?Skin:   ?     Concerned about lesion on arm  ?Psychiatric/Behavioral:   Positive for depression.   ?     Please refer to HPI  ? ?Medications: I have reviewed the patient's current medications. ? ?Current Outpatient Medications  ?Medication Sig Dispense Refill  ? [START ON 09/19/2021] Amphetamine Sulfate (EVEKEO) 10 MG TABS Take 1 tablet in the morning and 1/2 tablet mid-day 45 tablet 0  ? cetirizine (ZYRTEC) 10 MG tablet Take 10 mg by mouth daily.    ? Vilazodone HCl (VIIBRYD) 40 MG TABS Take 1 tablet (40 mg total) by mouth daily. 30 tablet 2  ? acetaminophen (TYLENOL) 325 MG tablet Take 650 mg by mouth every 6 (six) hours as needed.    ? Amphetamine Sulfate (EVEKEO) 10 MG TABS Take 10 mg by mouth every morning AND 5 mg daily after lunch. 45 tablet 0  ? [START ON 08/22/2021] Amphetamine Sulfate (EVEKEO) 10 MG TABS Take 1 tablet in the morning and 1/2 tablet mid-day 45 tablet 0  ? doxylamine, Sleep, (UNISOM) 25 MG tablet Take 12.5 mg by mouth at bedtime as needed.  (Patient not taking: Reported on 02/03/2021)    ? ibuprofen (ADVIL) 200 MG tablet Take 200 mg by mouth every 6 (six) hours as needed.    ? propranolol (INDERAL) 10 MG tablet TAKE 1 TO 2 TABLETS BY MOUTH TWICE A DAY AS NEEDED FOR ANXIETY 120 tablet 1  ? ?No current facility-administered medications for this visit.  ? ? ?Medication Side Effects: Other: Notices he will get "winded" quickly with Propranolol . Sexual side effects ? ?Allergies: No Known Allergies ? ?Past Medical History:  ?Diagnosis Date  ?  ADHD (attention deficit hyperactivity disorder)   ? De Quervain's tenosynovitis, right 12/2017  ? Dental crowns present   ? and 1 implant  ? History of gastric ulcer   ? Prosthetic eye globe   ? right - birth defect  ? ? ?Past Medical History, Surgical history, Social history, and Family history were reviewed and updated as appropriate.  ? ?Please see review of systems for further details on the patient's review from today.  ? ?Objective:  ? ?Physical Exam:  ?There were no vitals taken for this visit. ? ?Physical  Exam ?Constitutional:   ?   General: He is not in acute distress. ?Musculoskeletal:     ?   General: No deformity.  ?Neurological:  ?   Mental Status: He is alert and oriented to person, place, and time.  ?   Coordination: Coordination normal.  ?Psychiatric:     ?   Attention and Perception: Attention and perception normal. He does not perceive auditory or visual hallucinations.     ?   Mood and Affect: Affect is not labile, blunt, angry or inappropriate.     ?   Speech: Speech normal.     ?   Behavior: Behavior normal.     ?   Thought Content: Thought content normal. Thought content is not paranoid or delusional. Thought content does not include homicidal or suicidal ideation. Thought content does not include homicidal or suicidal plan.     ?   Cognition and Memory: Cognition and memory normal.     ?   Judgment: Judgment normal.  ?   Comments: Insight intact ?Mood is less depressed and anxiety  ? ? ?Lab Review:  ?   ?Component Value Date/Time  ? NA 138 07/10/2017 0859  ? K 4.2 07/10/2017 0859  ? CL 101 07/10/2017 0859  ? CO2 30 07/10/2017 0859  ? GLUCOSE 107 (H) 07/10/2017 0859  ? BUN 18 07/10/2017 0859  ? CREATININE 1.04 07/10/2017 0859  ? CALCIUM 10.0 07/10/2017 0859  ? PROT 7.5 07/10/2017 0859  ? ALBUMIN 4.8 07/10/2017 0859  ? AST 21 07/10/2017 0859  ? ALT 30 07/10/2017 0859  ? ALKPHOS 49 07/10/2017 0859  ? BILITOT 0.5 07/10/2017 0859  ? ? ?   ?Component Value Date/Time  ? WBC 5.4 07/10/2017 0859  ? RBC 5.39 07/10/2017 0859  ? HGB 15.6 07/10/2017 0859  ? HCT 45.0 07/10/2017 0859  ? PLT 250.0 07/10/2017 0859  ? MCV 83.5 07/10/2017 0859  ? MCHC 34.7 07/10/2017 0859  ? RDW 13.0 07/10/2017 0859  ? LYMPHSABS 2.1 07/10/2017 0859  ? MONOABS 0.4 07/10/2017 0859  ? EOSABS 0.1 07/10/2017 0859  ? BASOSABS 0.0 07/10/2017 0859  ? ? ?No results found for: POCLITH, LITHIUM  ? ?No results found for: PHENYTOIN, PHENOBARB, VALPROATE, CBMZ  ? ?.res ?Assessment: Plan:   ?Will continue current plan of care since target signs and  symptoms are well controlled without any tolerability issues. ?Will continue Viibryd 40 mg po qd since he reports that mood and anxiety symptoms have been better controlled with 40 mg dose.  ?Continue Evekeo 10 mg in the morning and 5 mg mid-day.  ?Continue Propranolol 10 mg 1-2 tabs po BID prn anxiety.  ?Recommend continuing therapy.  ?Pt to follow-up in 3 months or sooner if clinically indicated.  ?Patient advised to contact office with any questions, adverse effects, or acute worsening in signs and symptoms. ? ?Jaivyn was seen today for anxiety and depression. ? ?Diagnoses and all orders  for this visit: ? ?Attention deficit hyperactivity disorder (ADHD), combined type ?-     Amphetamine Sulfate (EVEKEO) 10 MG TABS; Take 1 tablet in the morning and 1/2 tablet mid-day ?-     Amphetamine Sulfate (EVEKEO) 10 MG TABS; Take 1 tablet in the morning and 1/2 tablet mid-day ? ?Generalized anxiety disorder ?-     propranolol (INDERAL) 10 MG tablet; TAKE 1 TO 2 TABLETS BY MOUTH TWICE A DAY AS NEEDED FOR ANXIETY ?-     Vilazodone HCl (VIIBRYD) 40 MG TABS; Take 1 tablet (40 mg total) by mouth daily. ? ?Major depression, recurrent, chronic (HCC) ?-     Vilazodone HCl (VIIBRYD) 40 MG TABS; Take 1 tablet (40 mg total) by mouth daily. ? ?  ? ?Please see After Visit Summary for patient specific instructions. ? ?Future Appointments  ?Date Time Provider Department Center  ?10/25/2021  8:30 AM Corie Chiquito, PMHNP [facility] None  ? ? ?No orders of the defined types were placed in this encounter. ? ? ?------------------------------- ?

## 2021-08-01 DIAGNOSIS — F419 Anxiety disorder, unspecified: Secondary | ICD-10-CM | POA: Diagnosis not present

## 2021-08-14 DIAGNOSIS — Q111 Other anophthalmos: Secondary | ICD-10-CM | POA: Diagnosis not present

## 2021-08-14 DIAGNOSIS — F419 Anxiety disorder, unspecified: Secondary | ICD-10-CM | POA: Diagnosis not present

## 2021-08-17 DIAGNOSIS — Z970 Presence of artificial eye: Secondary | ICD-10-CM | POA: Diagnosis not present

## 2021-08-17 DIAGNOSIS — H52222 Regular astigmatism, left eye: Secondary | ICD-10-CM | POA: Diagnosis not present

## 2021-08-17 DIAGNOSIS — H1045 Other chronic allergic conjunctivitis: Secondary | ICD-10-CM | POA: Diagnosis not present

## 2021-08-17 DIAGNOSIS — H5212 Myopia, left eye: Secondary | ICD-10-CM | POA: Diagnosis not present

## 2021-08-17 DIAGNOSIS — H02102 Unspecified ectropion of right lower eyelid: Secondary | ICD-10-CM | POA: Diagnosis not present

## 2021-08-22 ENCOUNTER — Other Ambulatory Visit: Payer: Self-pay | Admitting: Psychiatry

## 2021-08-22 DIAGNOSIS — F411 Generalized anxiety disorder: Secondary | ICD-10-CM

## 2021-09-08 ENCOUNTER — Telehealth: Payer: Self-pay | Admitting: Psychiatry

## 2021-09-08 NOTE — Telephone Encounter (Signed)
Called pharmacy. Patient had a script for April available, as well as one for May. They agreed that they did have a script that could be filled today and verified availability of medication. I notified patient.  ?

## 2021-09-08 NOTE — Telephone Encounter (Signed)
Patient called into office regarding prescription for Mark Fritz 10mg . States that he spoke with Pharmacy and was informed that prescription was couldn't be filled until 5/20. He is down to last two pills and needs a refill. Pls rtc to discuss [phone] Pharmacy [facility] [address] 6/20 ?

## 2021-09-12 DIAGNOSIS — F419 Anxiety disorder, unspecified: Secondary | ICD-10-CM | POA: Diagnosis not present

## 2021-09-24 ENCOUNTER — Other Ambulatory Visit: Payer: Self-pay

## 2021-09-24 ENCOUNTER — Emergency Department (HOSPITAL_BASED_OUTPATIENT_CLINIC_OR_DEPARTMENT_OTHER): Payer: BC Managed Care – PPO

## 2021-09-24 ENCOUNTER — Encounter: Payer: Self-pay | Admitting: Physician Assistant

## 2021-09-24 ENCOUNTER — Emergency Department (HOSPITAL_BASED_OUTPATIENT_CLINIC_OR_DEPARTMENT_OTHER)
Admission: EM | Admit: 2021-09-24 | Discharge: 2021-09-24 | Disposition: A | Discharge status: Home / Self Care | Payer: BC Managed Care – PPO | Attending: Emergency Medicine | Admitting: Emergency Medicine

## 2021-09-24 ENCOUNTER — Encounter (HOSPITAL_BASED_OUTPATIENT_CLINIC_OR_DEPARTMENT_OTHER): Payer: Self-pay | Admitting: Emergency Medicine

## 2021-09-24 DIAGNOSIS — R1032 Left lower quadrant pain: Secondary | ICD-10-CM | POA: Diagnosis not present

## 2021-09-24 LAB — COMPREHENSIVE METABOLIC PANEL
ALT: 37 U/L (ref 0–44)
AST: 22 U/L (ref 15–41)
Albumin: 4.6 g/dL (ref 3.5–5.0)
Alkaline Phosphatase: 47 U/L (ref 38–126)
Anion gap: 10 (ref 5–15)
BUN: 14 mg/dL (ref 6–20)
CO2: 26 mmol/L (ref 22–32)
Calcium: 9.8 mg/dL (ref 8.9–10.3)
Chloride: 102 mmol/L (ref 98–111)
Creatinine, Ser: 1.17 mg/dL (ref 0.61–1.24)
GFR, Estimated: >60 mL/min (ref >60)
Glucose, Bld: 103 mg/dL — ABNORMAL HIGH (ref 70–99)
Potassium: 3.9 mmol/L (ref 3.5–5.1)
Sodium: 138 mmol/L (ref 135–145)
Total Bilirubin: 0.5 mg/dL (ref 0.3–1.2)
Total Protein: 8.2 g/dL — ABNORMAL HIGH (ref 6.5–8.1)

## 2021-09-24 LAB — URINALYSIS, ROUTINE W REFLEX MICROSCOPIC
Bilirubin Urine: NEGATIVE
Glucose, UA: NEGATIVE mg/dL
Hgb urine dipstick: NEGATIVE
Ketones, ur: NEGATIVE mg/dL
Leukocytes,Ua: NEGATIVE
Nitrite: NEGATIVE
Protein, ur: NEGATIVE mg/dL
Specific Gravity, Urine: 1.009 (ref 1.005–1.030)
pH: 5.5 (ref 5.0–8.0)

## 2021-09-24 LAB — CBC WITH DIFFERENTIAL/PLATELET
Abs Immature Granulocytes: 0.02 10*3/uL (ref 0.00–0.07)
Basophils Absolute: 0 10*3/uL (ref 0.0–0.1)
Basophils Relative: 0 %
Eosinophils Absolute: 0.2 10*3/uL (ref 0.0–0.5)
Eosinophils Relative: 3 %
HCT: 46.3 % (ref 39.0–52.0)
Hemoglobin: 15.5 g/dL (ref 13.0–17.0)
Immature Granulocytes: 0 %
Lymphocytes Relative: 34 %
Lymphs Abs: 2.4 10*3/uL (ref 0.7–4.0)
MCH: 27.5 pg (ref 26.0–34.0)
MCHC: 33.5 g/dL (ref 30.0–36.0)
MCV: 82.1 fL (ref 80.0–100.0)
Monocytes Absolute: 0.4 10*3/uL (ref 0.1–1.0)
Monocytes Relative: 6 %
Neutro Abs: 3.8 10*3/uL (ref 1.7–7.7)
Neutrophils Relative %: 57 %
Platelets: 244 10*3/uL (ref 150–400)
RBC: 5.64 MIL/uL (ref 4.22–5.81)
RDW: 12.8 % (ref 11.5–15.5)
WBC: 6.9 10*3/uL (ref 4.0–10.5)
nRBC: 0 % (ref 0.0–0.2)

## 2021-09-24 LAB — LIPASE, BLOOD: Lipase: 32 U/L (ref 11–51)

## 2021-09-24 MED ORDER — MORPHINE SULFATE (PF) 4 MG/ML IV SOLN
4.0000 mg | Freq: Once | INTRAVENOUS | Status: DC
  Filled 2021-09-24: qty 1

## 2021-09-24 MED ORDER — SODIUM CHLORIDE 0.9 % IV BOLUS
1000.0000 mL | Freq: Once | INTRAVENOUS | Status: AC
  Administered 2021-09-24: 1000 mL via INTRAVENOUS

## 2021-09-24 MED ORDER — ONDANSETRON HCL 4 MG/2ML IJ SOLN
4.0000 mg | Freq: Once | INTRAMUSCULAR | Status: DC
Start: 2021-09-24 — End: 2021-09-24
  Filled 2021-09-24: qty 2

## 2021-09-24 MED ORDER — IOHEXOL 300 MG/ML  SOLN
100.0000 mL | Freq: Once | INTRAMUSCULAR | Status: AC | PRN
  Administered 2021-09-24: 100 mL via INTRAVENOUS

## 2021-09-24 NOTE — Discharge Instructions (Signed)
Take 8 scoops of miralax in 32oz of whatever you would like to drink.(Gatorade comes in this size) You can also use a fleets enema which you can buy over the counter at the pharmacy.  Return for worsening abdominal pain, vomiting or fever. ? ?

## 2021-09-24 NOTE — ED Notes (Signed)
Patient transported to CT 

## 2021-09-24 NOTE — ED Triage Notes (Signed)
Left abdominal /flank pain for 3 days, has worsened , painful when gas passing through intestines. Lbm yesterday,.

## 2021-09-24 NOTE — ED Provider Notes (Signed)
[facility] EMERGENCY DEPT Provider Note   CSN: [id number] Arrival date & time: 09/24/21  0802     History  Chief Complaint  Patient presents with   Abdominal Pain    Mark Fritz is a 36 y.o. male.  36 yo M with a chief complaints of left lower quadrant abdominal discomfort.  This been going on for a few days now.  Was recently on a vacation in the [place].  He feels like the pain got significantly worse yesterday.  No fevers no vomiting no diarrhea no trauma.  Initially thought was due to muscle strain from kayaking.  No prior abdominal surgery.   Abdominal Pain     Home Medications Prior to Admission medications   Medication Sig Start Date End Date Taking? Authorizing Provider  acetaminophen (TYLENOL) 325 MG tablet Take 650 mg by mouth every 6 (six) hours as needed.    [provider]  Amphetamine Sulfate (EVEKEO) 10 MG TABS Take 10 mg by mouth every morning AND 5 mg daily after lunch. 07/11/21 08/10/21  Corie Chiquito, PMHNP  Amphetamine Sulfate (EVEKEO) 10 MG TABS Take 1 tablet in the morning and 1/2 tablet mid-day 08/22/21   Corie Chiquito, PMHNP  Amphetamine Sulfate (EVEKEO) 10 MG TABS Take 1 tablet in the morning and 1/2 tablet mid-day 09/19/21   Corie Chiquito, PMHNP  cetirizine (ZYRTEC) 10 MG tablet Take 10 mg by mouth daily.    [provider]  doxylamine, Sleep, (UNISOM) 25 MG tablet Take 12.5 mg by mouth at bedtime as needed.  Patient not taking: Reported on 02/03/2021    [provider]  ibuprofen (ADVIL) 200 MG tablet Take 200 mg by mouth every 6 (six) hours as needed.    [provider]  propranolol (INDERAL) 10 MG tablet TAKE 1 TO 2 TABLETS BY MOUTH TWICE A DAY AS NEEDED FOR ANXIETY 08/22/21   Corie Chiquito, PMHNP  Vilazodone HCl (VIIBRYD) 40 MG TABS Take 1 tablet (40 mg total) by mouth daily. 07/25/21   Corie Chiquito, PMHNP      Allergies    Patient has no known allergies.    Review of Systems    Review of Systems  Gastrointestinal:  Positive for abdominal pain.   Physical Exam Updated Vital Signs BP 136/88   Pulse 84   Temp 98.2 F (36.8 C) (Oral)   Resp 20   SpO2 98%  Physical Exam Vitals and nursing note reviewed.  Constitutional:      Appearance: He is well-developed.  HENT:     Head: Normocephalic and atraumatic.  Eyes:     Comments: Right eye false, no ocular movement no pupillary response  Neck:     Vascular: No JVD.  Cardiovascular:     Rate and Rhythm: Normal rate and regular rhythm.     Heart sounds: No murmur heard.   No friction rub. No gallop.  Pulmonary:     Effort: No respiratory distress.     Breath sounds: No wheezing.  Abdominal:     General: There is no distension.     Tenderness: There is abdominal tenderness. There is no guarding or rebound.     Comments: Focal tenderness to the left lower quadrant.  No rash.  Musculoskeletal:        General: Normal range of motion.     Cervical back: Normal range of motion and neck supple.  Skin:    Coloration: Skin is not pale.     Findings: No rash.  Neurological:     Mental Status: He is alert and oriented to person, place, and time.  Psychiatric:        Behavior: Behavior normal.    ED Results / Procedures / Treatments   Labs (all labs ordered are listed, but only abnormal results are displayed) Labs Reviewed  COMPREHENSIVE METABOLIC PANEL - Abnormal; Notable for the following components:      Result Value   Glucose, Bld 103 (*)    Total Protein 8.2 (*)    All other components within normal limits  URINALYSIS, ROUTINE W REFLEX MICROSCOPIC - Abnormal; Notable for the following components:   Color, Urine COLORLESS (*)    All other components within normal limits  CBC WITH DIFFERENTIAL/PLATELET  LIPASE, BLOOD    EKG None  Radiology CT ABDOMEN PELVIS W CONTRAST  Result Date: 09/24/2021 CLINICAL DATA:  Left lower quadrant abdominal pain. Constipation for the past 3 days. EXAM: CT ABDOMEN  AND PELVIS WITH CONTRAST TECHNIQUE: Multidetector CT imaging of the abdomen and pelvis was performed using the standard protocol following bolus administration of intravenous contrast. RADIATION DOSE REDUCTION: This exam was performed according to the departmental dose-optimization program which includes automated exposure control, adjustment of the mA and/or kV according to patient size and/or use of iterative reconstruction technique. CONTRAST:  OMNIPAQUE IOHEXOL 300 MG/ML  SOLN COMPARISON:  03/07/2017 FINDINGS: Lower chest: Normal sized heart.  Clear lung bases. Hepatobiliary: Diffuse low density of the liver relative to the spleen. Normal appearing gallbladder. Pancreas: Unremarkable. No pancreatic ductal dilatation or surrounding inflammatory changes. Spleen: Normal in size without focal abnormality. Adrenals/Urinary Tract: Adrenal glands are unremarkable. Kidneys are normal, without renal calculi, focal lesion, or hydronephrosis. Bladder is unremarkable. Stomach/Bowel: Moderate amount of stool throughout the colon. Normal appearing appendix, small bowel and stomach. Vascular/Lymphatic: No significant vascular findings are present. No enlarged abdominal or pelvic lymph nodes. Reproductive: Prostate is unremarkable. Other: Small left inguinal hernia containing fluid. Musculoskeletal: Minimal lumbar and lower thoracic spine degenerative changes. IMPRESSION: 1. No acute abnormality. 2. Moderate amount of stool throughout the colon. Electronically Signed   By: Beckie Salts M.D.   On: 09/24/2021 09:44    Procedures Procedures    Medications Ordered in ED Medications  morphine (PF) 4 MG/ML injection 4 mg (4 mg Intravenous Not Given 09/24/21 0901)  ondansetron (ZOFRAN) injection 4 mg (4 mg Intravenous Not Given 09/24/21 0902)  sodium chloride 0.9 % bolus 1,000 mL (0 mLs Intravenous Stopped 09/24/21 0949)  iohexol (OMNIPAQUE) 300 MG/ML solution 100 mL (100 mLs Intravenous Contrast Given 09/24/21 0263)     ED Course/ Medical Decision Making/ A&P                           Medical Decision Making Amount and/or Complexity of Data Reviewed Labs: ordered. Radiology: ordered.  Risk Prescription drug management.   36 yo M with a chief complaints of left lower quadrant abdominal pain.  This been going on for about 3 days now.  He is focally tender to the area with some guarding.  We will obtain a CT scan.  Blood work.  Treat pain and nausea.  Reassess.  Blood work is resulted and is unremarkable no leukocytosis no anemia no LFT elevation lipase is normal.  UA independently interpreted by me without infection.  CT scan of the abdomen pelvis my independent interpretation with some mild increased stool burden.  No obvious inflammatory finding in the left lower quadrant.  Radiology read consistent with the same.  I discussed the results with the patient.  We will have him follow-up with his family doctor.  Trial of MiraLAX.  Tylenol and ibuprofen for pain.    9:57 AM:  I have discussed the diagnosis/risks/treatment options with the patient and family.  Evaluation and diagnostic testing in the emergency department does not suggest an emergent condition requiring admission or immediate intervention beyond what has been performed at this time.  They will follow up with  PCP. We also discussed returning to the ED immediately if new or worsening sx occur. We discussed the sx which are most concerning (e.g., sudden worsening pain, fever, inability to tolerate by mouth) that necessitate immediate return. Medications administered to the patient during their visit and any new prescriptions provided to the patient are listed below.  Medications given during this visit Medications  morphine (PF) 4 MG/ML injection 4 mg (4 mg Intravenous Not Given 09/24/21 0901)  ondansetron (ZOFRAN) injection 4 mg (4 mg Intravenous Not Given 09/24/21 0902)  sodium chloride 0.9 % bolus 1,000 mL (0 mLs Intravenous Stopped 09/24/21  0949)  iohexol (OMNIPAQUE) 300 MG/ML solution 100 mL (100 mLs Intravenous Contrast Given 09/24/21 [id number])     The patient appears reasonably screen and/or stabilized for discharge and I doubt any other medical condition or other [facility] requiring further screening, evaluation, or treatment in the ED at this time prior to discharge.          Final Clinical Impression(s) / ED Diagnoses Final diagnoses:  Left lower quadrant abdominal pain    Rx / DC Orders ED Discharge Orders     None         Melene PlanFloyd, Phillippa Straub, DO 09/24/21 [id number]

## 2021-09-27 ENCOUNTER — Other Ambulatory Visit: Payer: Self-pay | Admitting: Psychiatry

## 2021-09-27 DIAGNOSIS — F411 Generalized anxiety disorder: Secondary | ICD-10-CM

## 2021-09-28 NOTE — Progress Notes (Unsigned)
I, Christoper Fabian, LAT, ATC, am serving as scribe for Dr. Clementeen Graham.   Mark Fritz is a 36 y.o. male who presents to [facility] today for L lower quadrant pain.  He was last seen by Dr. Denyse Amass on 09/20/20 for R rib/trunk pain.  Today, pt reports pain in his L lower abdomen x 1 week, w/ pain drastically improving.  Pt was vacationing [place] and was going a lot of activity; kayaking, snorkeling etc. He was seen at the [facility] ED on 09/24/21 for these c/o and had a work-up including labs, urinalysis and CT abdomen/pelvis w/ contrast.  He locates his pain to the R-side of his abdomen. Pt was able to go for a run this morning.   Radiating pain: no Aggravating factors: hip flexor stretch Treatments tried: heat, Miramax  Diagnostic testing: CT abdomen pelvis w/ contrast- 09/24/21  Pertinent review of systems: No fevers or chills  Relevant historical information: Small left-sided inguinal hernia seen on CT scan.  Patient is asymptomatic at this area.   Exam:  BP 136/88   Pulse 91   Ht 5\' 3"  (1.6 m)   Wt 216 lb 3.2 oz (98.1 kg)   SpO2 97%   BMI 38.30 kg/m  General: Well Developed, well nourished, and in no acute distress.   MSK: Left abdomen nontender normal abdomen motion.    Lab and Radiology Results CT ABDOMEN PELVIS W CONTRAST  Result Date: 09/24/2021 CLINICAL DATA:  Left lower quadrant abdominal pain. Constipation for the past 3 days. EXAM: CT ABDOMEN AND PELVIS WITH CONTRAST TECHNIQUE: Multidetector CT imaging of the abdomen and pelvis was performed using the standard protocol following bolus administration of intravenous contrast. RADIATION DOSE REDUCTION: This exam was performed according to the departmental dose-optimization program which includes automated exposure control, adjustment of the mA and/or kV according to patient size and/or use of iterative reconstruction technique. CONTRAST:  09/26/2021 OMNIPAQUE IOHEXOL 300 MG/ML  SOLN COMPARISON:   03/07/2017 FINDINGS: Lower chest: Normal sized heart.  Clear lung bases. Hepatobiliary: Diffuse low density of the liver relative to the spleen. Normal appearing gallbladder. Pancreas: Unremarkable. No pancreatic ductal dilatation or surrounding inflammatory changes. Spleen: Normal in size without focal abnormality. Adrenals/Urinary Tract: Adrenal glands are unremarkable. Kidneys are normal, without renal calculi, focal lesion, or hydronephrosis. Bladder is unremarkable. Stomach/Bowel: Moderate amount of stool throughout the colon. Normal appearing appendix, small bowel and stomach. Vascular/Lymphatic: No significant vascular findings are present. No enlarged abdominal or pelvic lymph nodes. Reproductive: Prostate is unremarkable. Other: Small left inguinal hernia containing fluid. Musculoskeletal: Minimal lumbar and lower thoracic spine degenerative changes. IMPRESSION: 1. No acute abnormality. 2. Moderate amount of stool throughout the colon. Electronically Signed   By: 13/11/2016 M.D.   On: 09/24/2021 09:44   I, 09/26/2021, personally (independently) visualized and performed the interpretation of the images attached in this note.      Assessment and Plan: 36 y.o. male with left abdominal wall pain after kayaking.  Patient experienced an abdominal wall muscle strain or delayed onset muscle soreness.  He is feeling much better now after some relative rest.  Discussed a graduated return to exercise protocol.  A small left inguinal hernia was incidentally noted.  Watchful waiting with this issue.  It is not symptomatic enough to proceed to surgery.    Discussed warning signs or symptoms. Please see discharge instructions. Patient expresses understanding.   The above documentation has been reviewed and is accurate and  complete Lynne Leader, M.D.

## 2021-09-29 ENCOUNTER — Ambulatory Visit (INDEPENDENT_AMBULATORY_CARE_PROVIDER_SITE_OTHER): Payer: BC Managed Care – PPO | Admitting: Family Medicine

## 2021-09-29 VITALS — BP 136/88 | HR 91 | Ht 63.0 in | Wt 216.2 lb

## 2021-09-29 DIAGNOSIS — R109 Unspecified abdominal pain: Secondary | ICD-10-CM

## 2021-09-29 DIAGNOSIS — K4090 Unilateral inguinal hernia, without obstruction or gangrene, not specified as recurrent: Secondary | ICD-10-CM | POA: Diagnosis not present

## 2021-09-29 NOTE — Patient Instructions (Addendum)
Thank you for coming in today.   Seems like things are improving.   Resume activity as tolerated.  Let me know if you need anything in the future

## 2021-10-11 ENCOUNTER — Other Ambulatory Visit: Payer: Self-pay | Admitting: Psychiatry

## 2021-10-11 DIAGNOSIS — F411 Generalized anxiety disorder: Secondary | ICD-10-CM

## 2021-10-12 DIAGNOSIS — F419 Anxiety disorder, unspecified: Secondary | ICD-10-CM | POA: Diagnosis not present

## 2021-10-25 ENCOUNTER — Encounter: Payer: Self-pay | Admitting: Psychiatry

## 2021-10-25 ENCOUNTER — Ambulatory Visit (INDEPENDENT_AMBULATORY_CARE_PROVIDER_SITE_OTHER): Payer: BC Managed Care – PPO | Admitting: Psychiatry

## 2021-10-25 DIAGNOSIS — F3342 Major depressive disorder, recurrent, in full remission: Secondary | ICD-10-CM | POA: Diagnosis not present

## 2021-10-25 DIAGNOSIS — F902 Attention-deficit hyperactivity disorder, combined type: Secondary | ICD-10-CM

## 2021-10-25 DIAGNOSIS — F411 Generalized anxiety disorder: Secondary | ICD-10-CM

## 2021-10-25 MED ORDER — AMPHETAMINE SULFATE 10 MG PO TABS: ORAL_TABLET | ORAL | 0 refills | Status: DC

## 2021-10-25 MED ORDER — VILAZODONE HCL 20 MG PO TABS: 20.0000 mg | ORAL_TABLET | Freq: Every day | ORAL | 1 refills | Status: DC

## 2021-10-25 NOTE — Progress Notes (Signed)
Mark Fritz [id number] April 29, 1986 36 y.o.  Subjective:   Patient ID:  Mark Fritz is a 36 y.o. (DOB 09-Mar-1986) male.  Chief Complaint:  Chief Complaint  Patient presents with   Follow-up    Depression, anxiety, and insomnia    HPI Mark Fritz presents to the office today for follow-up of anxiety, depression, and insomnia. He reports, "things have been really good." He has accepted a new job position and is excited about this. He is working out his resignation at his current position.   He reports that he has been taking Viibryd 40 mg 1/2 tablet for about a month since 40 mg was causing some sleep disturbance. Sleep has improved with taking Viibryd 20 mg daily. Using Propranolol prn as needed about 1-2 times a week. He reports that anxiety has been manageable. Denies depressed mood. Energy and motivation have been good. Concentration has been good. Appetite has been good. Denies SI.   Wife has been working on her masters' in [employer]. Children are now 24-74 years old.   Has been seeing a therapist individually. He and his wife are no longer seeing marriage therapist.   Last filled 10/12/21.  Past Psychiatric Medication Trials: Sertraline-Effective. Multiple adverse effects (initial GI side effects, insomnia, cognitive side effects, sexual side effects) Trintellix- Not effective and caused some "fuzzy head" Viibryd- Some sexual side effects starting at 40 mg Wellbutrin- irritability Adderall- decreased appetite, increased irritability Evekeo- effective for attention deficit with out significant tolerability issues Lithium Hydroxyzine- causing excessive drowsiness  PHQ2-9    Flowsheet Row Office Visit from 02/07/2017 in [facility]  PHQ-2 Total Score 4  PHQ-9 Total Score 15      Flowsheet Row ED from 09/24/2021 in [facility]  C-SSRS RISK CATEGORY No Risk        Review of Systems:  Review of Systems   Musculoskeletal:  Negative for gait problem.       Pain in side resolved  Neurological:  Negative for tremors.  Psychiatric/Behavioral:         Please refer to HPI    Medications: I have reviewed the patient's current medications.  Current Outpatient Medications  Medication Sig Dispense Refill   doxylamine, Sleep, (UNISOM) 25 MG tablet Take 12.5 mg by mouth at bedtime as needed.     ibuprofen (ADVIL) 200 MG tablet Take 200 mg by mouth every 6 (six) hours as needed.     propranolol (INDERAL) 10 MG tablet TAKE 1 TO 2 TABLETS BY MOUTH TWICE A DAY AS NEEDED FOR ANXIETY 360 tablet 0   Vilazodone HCl 20 MG TABS Take 1 tablet (20 mg total) by mouth daily with breakfast. 90 tablet 1   acetaminophen (TYLENOL) 325 MG tablet Take 650 mg by mouth every 6 (six) hours as needed.     [START ON 01/04/2022] Amphetamine Sulfate (EVEKEO) 10 MG TABS Take 1 tablet in the morning and 1/2 tablet mid-day 45 tablet 0   [START ON 12/07/2021] Amphetamine Sulfate (EVEKEO) 10 MG TABS Take 1 tablet in the morning and 1/2 tablet mid-day 45 tablet 0   [START ON 11/09/2021] Amphetamine Sulfate (EVEKEO) 10 MG TABS Take 10 mg by mouth every morning AND 5 mg daily after lunch. 45 tablet 0   cetirizine (ZYRTEC) 10 MG tablet Take 10 mg by mouth daily. (Patient not taking: Reported on 10/25/2021)     No current facility-administered medications for this visit.    Medication Side Effects: Other: Sleep disturbance  with 40 mg of Viibryd. 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   Please see review of systems for further  details on the patient's review from today.   Objective:   Physical Exam:  BP 129/72   Pulse 86   Physical Exam Constitutional:      General: He is not in acute distress. Musculoskeletal:        General: No deformity.  Neurological:     Mental Status: He is alert and oriented to person, place, and time.     Coordination: Coordination normal.  Psychiatric:        Attention and Perception: Attention and perception normal. He does not perceive auditory or visual hallucinations.        Mood and Affect: Mood normal. Mood is not anxious or depressed. Affect is not labile, blunt, angry or inappropriate.        Speech: Speech normal.        Behavior: Behavior normal.        Thought Content: Thought content normal. Thought content is not paranoid or delusional. Thought content does not include homicidal or suicidal ideation. Thought content does not include homicidal or suicidal plan.        Cognition and Memory: Cognition and memory normal.        Judgment: Judgment normal.     Comments: Insight intact     Lab Review:     Component Value Date/Time   NA 138 09/24/2021 0819   K 3.9 09/24/2021 0819   CL 102 09/24/2021 0819   CO2 26 09/24/2021 0819   GLUCOSE 103 (H) 09/24/2021 0819   BUN 14 09/24/2021 0819   CREATININE 1.17 09/24/2021 0819   CALCIUM 9.8 09/24/2021 0819   PROT 8.2 (H) 09/24/2021 0819   ALBUMIN 4.6 09/24/2021 0819   AST 22 09/24/2021 0819   ALT 37 09/24/2021 0819   ALKPHOS 47 09/24/2021 0819   BILITOT 0.5 09/24/2021 0819   GFRNONAA >60 09/24/2021 0819       Component Value Date/Time   WBC 6.9 09/24/2021 0819   RBC 5.64 09/24/2021 0819   HGB 15.5 09/24/2021 0819   HCT 46.3 09/24/2021 0819   PLT 244 09/24/2021 0819   MCV 82.1 09/24/2021 0819   MCH 27.5 09/24/2021 0819   MCHC 33.5 09/24/2021 0819   RDW 12.8 09/24/2021 0819   LYMPHSABS 2.4 09/24/2021 0819   MONOABS 0.4 09/24/2021 0819   EOSABS 0.2 09/24/2021 0819   BASOSABS 0.0 09/24/2021 0819    No  results found for: "POCLITH", "LITHIUM"   No results found for: "PHENYTOIN", "PHENOBARB", "VALPROATE", "CBMZ"   .res Assessment: Plan:    Pt seen for 30 minutes and time spent discussing response to decrease in Viibryd from 40 mg to 20 mg after he started taking 1/2 tablet about a month ago. He reports that his mood and anxiety symptoms have remained well controlled with Viibryd 20 mg po qd. Will therefore change script to Viibryd 20 mg tablets, one tab po qd.  Will continue Evekeo 10 mg in the morning and  1/2 tablet mid-day for ADHD.  Continue Propranolol 10 mg 1-2 tabs po BID prn anxiety.  Pt to follow-up in 3 months or sooner if clinically indicated.  Patient advised to contact office with any questions, adverse effects, or acute worsening in signs and symptoms.   Tamas was seen today for follow-up.  Diagnoses and all orders for this visit:  Generalized anxiety disorder -     Vilazodone HCl 20 MG TABS; Take 1 tablet (20 mg total) by mouth daily with breakfast.  Recurrent major depressive disorder, in full remission (HCC) -     Vilazodone HCl 20 MG TABS; Take 1 tablet (20 mg total) by mouth daily with breakfast.  Attention deficit hyperactivity disorder (ADHD), combined type -     Amphetamine Sulfate (EVEKEO) 10 MG TABS; Take 1 tablet in the morning and 1/2 tablet mid-day -     Amphetamine Sulfate (EVEKEO) 10 MG TABS; Take 1 tablet in the morning and 1/2 tablet mid-day -     Amphetamine Sulfate (EVEKEO) 10 MG TABS; Take 10 mg by mouth every morning AND 5 mg daily after lunch.     Please see After Visit Summary for patient specific instructions.  No future appointments.  No orders of the defined types were placed in this encounter.   -------------------------------

## 2022-01-22 ENCOUNTER — Encounter: Payer: Self-pay | Admitting: *Deleted

## 2022-02-09 ENCOUNTER — Ambulatory Visit: Payer: 59 | Admitting: Psychiatry

## 2022-03-06 ENCOUNTER — Telehealth: Payer: Self-pay | Admitting: Psychiatry

## 2022-03-06 ENCOUNTER — Other Ambulatory Visit: Payer: Self-pay

## 2022-03-06 DIAGNOSIS — F902 Attention-deficit hyperactivity disorder, combined type: Secondary | ICD-10-CM

## 2022-03-06 MED ORDER — AMPHETAMINE SULFATE 10 MG PO TABS: ORAL_TABLET | ORAL | 0 refills | Status: DC

## 2022-03-06 NOTE — Telephone Encounter (Signed)
Pt called and said that he needs a refill on his eveko 10 mg. Pharmacy is [facility] on [address]

## 2022-03-06 NOTE — Telephone Encounter (Signed)
Pended.

## 2022-03-21 ENCOUNTER — Encounter: Payer: Self-pay | Admitting: Psychiatry

## 2022-03-21 ENCOUNTER — Telehealth (INDEPENDENT_AMBULATORY_CARE_PROVIDER_SITE_OTHER): Payer: 59 | Admitting: Psychiatry

## 2022-03-21 DIAGNOSIS — F3342 Major depressive disorder, recurrent, in full remission: Secondary | ICD-10-CM

## 2022-03-21 DIAGNOSIS — F902 Attention-deficit hyperactivity disorder, combined type: Secondary | ICD-10-CM | POA: Diagnosis not present

## 2022-03-21 DIAGNOSIS — F411 Generalized anxiety disorder: Secondary | ICD-10-CM | POA: Diagnosis not present

## 2022-03-21 MED ORDER — AMPHETAMINE SULFATE 10 MG PO TABS: ORAL_TABLET | ORAL | 0 refills | Status: DC

## 2022-03-21 MED ORDER — VILAZODONE HCL 20 MG PO TABS: 20.0000 mg | ORAL_TABLET | Freq: Every day | ORAL | 1 refills | Status: DC

## 2022-03-21 MED ORDER — PROPRANOLOL HCL 10 MG PO TABS: ORAL_TABLET | ORAL | 0 refills | Status: DC

## 2022-03-21 NOTE — Progress Notes (Signed)
Mark Fritz [id number] Aug 25, 1985 36 y.o.  Virtual Visit via Video Note  I connected with pt @ on 03/21/22 at  1:15 PM EST by a video enabled telemedicine application and verified that I am speaking with the correct person using two identifiers.   I discussed the limitations of evaluation and management by telemedicine and the availability of in person appointments. The patient expressed understanding and agreed to proceed.  I discussed the assessment and treatment plan with the patient. The patient was provided an opportunity to ask questions and all were answered. The patient agreed with the plan and demonstrated an understanding of the instructions.   The patient was advised to call back or seek an in-person evaluation if the symptoms worsen or if the condition fails to improve as anticipated.  I provided 30 minutes of non-face-to-face time during this encounter.  The patient was located at home.  The provider was located at [facility].   Corie Chiquito, PMHNP   Subjective:   Patient ID:  Mark Fritz is a 37 y.o. (DOB 01/20/1986) male.  Chief Complaint:  Chief Complaint  Patient presents with   Follow-up    Anxiety, depression, ADHD, insomnia    HPI Mark Fritz presents for follow-up of anxiety, depression, ADHD, and insomnia. He reports that he is enjoying his new job and that it is significantly less stressful and more enjoyable. He has colleagues that perform the same job and that he enjoys being part of a team. Mood has also improved. Denies depressed mood or significant irritability. Sleeping well. He reports sleep score has improved according to his Fit Bit. Energy and motivation have been good. He reports that his concentration has been adequate with Evekeo. He notices that his concentration improved with taking Evekeo consistently and notices some change when he does not take it as consistently. Appetite has been ok. Denies SI.   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    Wife has been working part-time and is enjoying this.   Evekeo last filled 03/06/22.  Past Psychiatric Medication Trials: Sertraline-Effective. Multiple adverse effects (initial GI side effects, insomnia, cognitive side effects, sexual side effects) Trintellix- Not effective and caused some "fuzzy head" Viibryd- Some sexual side effects starting at 40 mg Wellbutrin- irritability Adderall- decreased appetite, increased irritability Evekeo- effective for attention deficit with out significant tolerability issues Lithium Hydroxyzine- causing excessive drowsiness  Review of Systems:  Review of Systems  Eyes:        Floaters  Musculoskeletal:  Negative for gait problem.  Psychiatric/Behavioral:         Please refer to HPI    Medications: I have reviewed the patient's current medications.  Current Outpatient Medications  Medication Sig Dispense Refill   doxylamine, Sleep, (UNISOM) 25 MG tablet Take 12.5 mg by mouth at bedtime as needed.     famotidine (PEPCID) 10 MG tablet Take 10 mg by mouth as needed for heartburn or indigestion.     hydrOXYzine (VISTARIL) 25 MG capsule Take 25 mg by mouth at bedtime as needed for nausea.     [START ON 04/03/2022] Amphetamine Sulfate (EVEKEO) 10 MG TABS Take 10 mg by mouth every morning AND 5 mg daily after lunch. 45 tablet 0   [START ON 05/01/2022] Amphetamine Sulfate (EVEKEO) 10 MG TABS Take 1 tablet in the morning and 1/2 tablet mid-day 45 tablet 0   [START ON 05/29/2022] Amphetamine Sulfate (EVEKEO) 10 MG TABS Take 1 tablet in the morning and 1/2 tablet mid-day 45 tablet 0  cetirizine (ZYRTEC) 10 MG tablet Take 10 mg by mouth daily. (Patient not taking: Reported on 10/25/2021)     propranolol (INDERAL) 10 MG tablet TAKE 1 TO 2 TABLETS BY MOUTH TWICE A DAY AS NEEDED FOR ANXIETY 360 tablet 0   Vilazodone HCl 20 MG TABS Take 1 tablet (20 mg total) by mouth daily with breakfast. 90 tablet 1   No current facility-administered medications for this  visit.    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    Family History  Problem Relation Age of Onset   Depression Mother    Diabetes Mother    Miscarriages / [place] Mother    Hearing loss Father    Hypertension Father    Mental illness Maternal Grandmother    Hypertension Paternal Grandfather    Stroke Paternal Grandfather    Depression Sister     Social History   Socioeconomic History   Marital status: Married    Spouse name: Not on file   Number of children: Not on file   Years of education: Not on file   Highest education level: Not on file  Occupational History   Not on file  Tobacco Use   Smoking status: Never   Smokeless tobacco: Never  Vaping Use   Vaping Use: Never used  Substance and Sexual Activity   Alcohol use: No   Drug use: No   Sexual activity: Yes  Other Topics Concern   Not on file  Social History Narrative   Not on file   Social Determinants of Health   Financial Resource Strain: Not on file  Food Insecurity: Not on file  Transportation Needs: Not on file  Physical Activity: Not on file  Stress: Not on file  Social Connections: Not on file  Intimate Partner Violence: Not on file    Past Medical History, Surgical history, Social history, and Family history were reviewed and updated as appropriate.   Please see review of systems for further details on the patient's review from today.   Objective:   Physical Exam:  There were no vitals taken for this visit.  Physical Exam Neurological:     Mental Status: He is alert and oriented to person, place, and time.     Cranial Nerves: No dysarthria.  Psychiatric:        Attention and Perception: Attention and  perception normal.        Mood and Affect: Mood normal.        Speech: Speech normal.        Behavior: Behavior is cooperative.        Thought Content: Thought content normal. Thought content is not paranoid or delusional. Thought content does not include homicidal or suicidal ideation. Thought content does not include homicidal or suicidal plan.        Cognition and Memory: Cognition and memory normal.        Judgment: Judgment normal.     Comments: Insight intact     Lab Review:     Component Value Date/Time   NA 138 09/24/2021 0819   K 3.9 09/24/2021 0819   CL 102 09/24/2021 0819   CO2 26 09/24/2021 0819  GLUCOSE 103 (H) 09/24/2021 0819   BUN 14 09/24/2021 0819   CREATININE 1.17 09/24/2021 0819   CALCIUM 9.8 09/24/2021 0819   PROT 8.2 (H) 09/24/2021 0819   ALBUMIN 4.6 09/24/2021 0819   AST 22 09/24/2021 0819   ALT 37 09/24/2021 0819   ALKPHOS 47 09/24/2021 0819   BILITOT 0.5 09/24/2021 0819   GFRNONAA >60 09/24/2021 0819       Component Value Date/Time   WBC 6.9 09/24/2021 0819   RBC 5.64 09/24/2021 0819   HGB 15.5 09/24/2021 0819   HCT 46.3 09/24/2021 0819   PLT 244 09/24/2021 0819   MCV 82.1 09/24/2021 0819   MCH 27.5 09/24/2021 0819   MCHC 33.5 09/24/2021 0819   RDW 12.8 09/24/2021 0819   LYMPHSABS 2.4 09/24/2021 0819   MONOABS 0.4 09/24/2021 0819   EOSABS 0.2 09/24/2021 0819   BASOSABS 0.0 09/24/2021 0819    No results found for: "POCLITH", "LITHIUM"   No results found for: "PHENYTOIN", "PHENOBARB", "VALPROATE", "CBMZ"   .res Assessment: Plan:    Pt seen for 30 minutes and time spent reviewing treatment plan. He reports that he would like to continue current medications without changes.  Will continue Viibryd 20 mg po qd for anxiety and depression.  Continue Evekeo 10 mg in the morning and 1/2 tab mid-day for ADHD.  Continue Hydroxyzine 25 mg po QHS prn insomnia. Continue Propranolol 10 mg 1-2 tabs po BID prn anxiety.  Pt to follow-up in 6 months or  sooner if clinically indicated.  Requested pt call in 3 months to provide update and request additional scripts.  Patient advised to contact office with any questions, adverse effects, or acute worsening in signs and symptoms.   Ade was seen today for follow-up.  Diagnoses and all orders for this visit:  Generalized anxiety disorder -     Vilazodone HCl 20 MG TABS; Take 1 tablet (20 mg total) by mouth daily with breakfast. -     propranolol (INDERAL) 10 MG tablet; TAKE 1 TO 2 TABLETS BY MOUTH TWICE A DAY AS NEEDED FOR ANXIETY  Recurrent major depressive disorder, in full remission (HCC) -     Vilazodone HCl 20 MG TABS; Take 1 tablet (20 mg total) by mouth daily with breakfast.  Attention deficit hyperactivity disorder (ADHD), combined type -     Amphetamine Sulfate (EVEKEO) 10 MG TABS; Take 10 mg by mouth every morning AND 5 mg daily after lunch. -     Amphetamine Sulfate (EVEKEO) 10 MG TABS; Take 1 tablet in the morning and 1/2 tablet mid-day -     Amphetamine Sulfate (EVEKEO) 10 MG TABS; Take 1 tablet in the morning and 1/2 tablet mid-day     Please see After Visit Summary for patient specific instructions.  No future appointments.  No orders of the defined types were placed in this encounter.     -------------------------------

## 2022-03-27 ENCOUNTER — Telehealth: Payer: Self-pay | Admitting: Physician Assistant

## 2022-03-27 NOTE — Telephone Encounter (Signed)
Patient states he would like to transfer his care from [facility] to Dr. Glenetta Hew. Is this okay with you?

## 2022-03-30 ENCOUNTER — Ambulatory Visit (INDEPENDENT_AMBULATORY_CARE_PROVIDER_SITE_OTHER): Payer: 59 | Admitting: Internal Medicine

## 2022-03-30 ENCOUNTER — Encounter: Payer: Self-pay | Admitting: Internal Medicine

## 2022-03-30 ENCOUNTER — Ambulatory Visit: Payer: Self-pay | Admitting: Internal Medicine

## 2022-03-30 VITALS — BP 127/84 | HR 82 | Temp 98.1°F | Resp 14 | Ht 63.0 in | Wt 215.2 lb

## 2022-03-30 DIAGNOSIS — Z9889 Other specified postprocedural states: Secondary | ICD-10-CM

## 2022-03-30 DIAGNOSIS — Z8249 Family history of ischemic heart disease and other diseases of the circulatory system: Secondary | ICD-10-CM

## 2022-03-30 DIAGNOSIS — F902 Attention-deficit hyperactivity disorder, combined type: Secondary | ICD-10-CM | POA: Diagnosis not present

## 2022-03-30 DIAGNOSIS — E669 Obesity, unspecified: Secondary | ICD-10-CM | POA: Insufficient documentation

## 2022-03-30 DIAGNOSIS — Z4421 Encounter for fitting and adjustment of artificial right eye: Secondary | ICD-10-CM | POA: Diagnosis not present

## 2022-03-30 DIAGNOSIS — K5904 Chronic idiopathic constipation: Secondary | ICD-10-CM

## 2022-03-30 DIAGNOSIS — Z0000 Encounter for general adult medical examination without abnormal findings: Secondary | ICD-10-CM

## 2022-03-30 DIAGNOSIS — K4090 Unilateral inguinal hernia, without obstruction or gangrene, not specified as recurrent: Secondary | ICD-10-CM

## 2022-03-30 DIAGNOSIS — F331 Major depressive disorder, recurrent, moderate: Secondary | ICD-10-CM

## 2022-03-30 DIAGNOSIS — L989 Disorder of the skin and subcutaneous tissue, unspecified: Secondary | ICD-10-CM

## 2022-03-30 LAB — TSH: TSH: 1.17 u[IU]/mL (ref 0.35–5.50)

## 2022-03-30 LAB — CBC WITH DIFFERENTIAL/PLATELET
Basophils Absolute: 0 10*3/uL (ref 0.0–0.1)
Basophils Relative: 0.6 % (ref 0.0–3.0)
Eosinophils Absolute: 0.1 10*3/uL (ref 0.0–0.7)
Eosinophils Relative: 1 % (ref 0.0–5.0)
HCT: 46 % (ref 39.0–52.0)
Hemoglobin: 15.7 g/dL (ref 13.0–17.0)
Lymphocytes Relative: 32.4 % (ref 12.0–46.0)
Lymphs Abs: 2.5 10*3/uL (ref 0.7–4.0)
MCHC: 34.1 g/dL (ref 30.0–36.0)
MCV: 83.7 fl (ref 78.0–100.0)
Monocytes Absolute: 0.5 10*3/uL (ref 0.1–1.0)
Monocytes Relative: 6.2 % (ref 3.0–12.0)
Neutro Abs: 4.6 10*3/uL (ref 1.4–7.7)
Neutrophils Relative %: 59.8 % (ref 43.0–77.0)
Platelets: 269 10*3/uL (ref 150.0–400.0)
RBC: 5.49 Mil/uL (ref 4.22–5.81)
RDW: 13.4 % (ref 11.5–15.5)
WBC: 7.7 10*3/uL (ref 4.0–10.5)

## 2022-03-30 LAB — COMPREHENSIVE METABOLIC PANEL
ALT: 43 U/L (ref 0–53)
AST: 26 U/L (ref 0–37)
Albumin: 5.1 g/dL (ref 3.5–5.2)
Alkaline Phosphatase: 60 U/L (ref 39–117)
BUN: 17 mg/dL (ref 6–23)
CO2: 29 mEq/L (ref 19–32)
Calcium: 10.2 mg/dL (ref 8.4–10.5)
Chloride: 100 mEq/L (ref 96–112)
Creatinine, Ser: 1.14 mg/dL (ref 0.40–1.50)
GFR: 81.63 mL/min (ref >60.00)
Glucose, Bld: 92 mg/dL (ref 70–99)
Potassium: 4.3 mEq/L (ref 3.5–5.1)
Sodium: 138 mEq/L (ref 135–145)
Total Bilirubin: 0.7 mg/dL (ref 0.2–1.2)
Total Protein: 7.9 g/dL (ref 6.0–8.3)

## 2022-03-30 LAB — LIPID PANEL
Cholesterol: 228 mg/dL — ABNORMAL HIGH (ref 0–200)
HDL: 65.5 mg/dL (ref >39.00)
LDL Cholesterol: 139 mg/dL — ABNORMAL HIGH (ref 0–99)
NonHDL: 162.98
Total CHOL/HDL Ratio: 3
Triglycerides: 122 mg/dL (ref 0.0–149.0)
VLDL: 24.4 mg/dL (ref 0.0–40.0)

## 2022-03-30 LAB — HEMOGLOBIN A1C: Hgb A1c MFr Bld: 5.9 % (ref 4.6–6.5)

## 2022-03-30 NOTE — Patient Instructions (Addendum)
It was a pleasure seeing you today!  Your health and satisfaction are my top priorities. If you believe your experience today was worthy of a 5-star rating, I'd be grateful for your feedback! Lula Olszewski, MD   AT CHECKOUT:  []    Sign release of information at the check out desk for: Any records we need for your care and to be your medical home  []    Schedule next appointment:  Return in about 1 year (around 03/31/2023) for Annual Exam.  If you are not doing well:  Return to the office sooner  Please bring all your medicines to each appointment If your condition begins to worsen or become severe:  GO to the ER   CLINICAL PLAN REMINDERS: Your checklist to help you remember today's clinical plan  []    Encourage weight loss.  Encourage 20g daily fiber gummies.  Then next colace daily.  Then next exlax or senna daily,too.  Keep adding til bowel movement are daily and satisfying.  Drink lotta water.  []   (Optional):  Review your clinical notes on MyChart after they are completed.     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  Most normal results will be posted to MyChart immediately and have a clinical review message by Dr.  14/04/2022 posted within 2-3 business days.   If you have not heard from regarding the results in 2 weeks OR if you need priority reporting, please contact this office. MYCHART:  The fastest way to get your results and easiest way to stay in touch with is by activating your My Chart account. Instructions are located on the last page of this paperwork.  BILLING: xray and lab orders are billed from separate companies and questions./concerns should be directed to the invoicing company.  For visit charges please discuss with our administrative services COMPLAINTS:  please let Dr. [phone] know or see the [facility] Healthcare Practice Administrator - [place], by asking at the front desk: we want you to be satisfied with every experience and we would be grateful for the opportunity to address any problems

## 2022-03-30 NOTE — Progress Notes (Signed)
Nature conservation officer at [employer]: [phone] Provider: Lula Olszewski, MD   Chief Complaint:  Mark Fritz is a 36 y.o. assigned male at birth who presents today for his annual comprehensive physical exam.    Medical assistant also reports: Chief Complaint  Patient presents with   Transfer of care   Annual Exam    Ordered blood work, ate a small amount of cheese this morning (about 1 oz) to go with medicine.    Assessment/Plan:   Howell was seen today for transfer of care and annual exam.  Preventative health care  Chronic idiopathic constipation Overview: 2018 CAT scan showed mesenteric lymph nodes all less than 1 cm on the right and at the times I have very significant stool burden.  Moderate stool burden was shown on May 2023 CAT scan he also suspects he has hemorrhoids based on his self evaluation not yet confirmed.  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  Having vision corrected can improve the quality of day-to-day life.  Eye specialists can detect certain eye conditions such as cataracts,  glaucoma and age-related macular degeneration, which could lead to sight loss.  They can also detect certain rare cancers and diabetes, among other things.  He has single eye and sees Dr. Sherrine Maples for regular eye Dental health: Discussed importance of regular tooth brushing, flossing, and dental visits q6 months.  Poor dentition can lead to serious medical problems - particularly problems with heart valves. Sinus health: Encourage sterile saline nasal misting sinus rinses daily for pollen, to reduce allergies and risk for sinus infections.   Cardiovascular Risk Factor Reduction:   Advised patient of need for regular exercise and diet rich and fruits and vegetables and healthy fats to reduce risk of heart attack and stroke. Avoid first- and second-hand smoke and stimulants.   Avoid extreme exercise- exercise in moderation (150 minutes per week is a good goal) Wt Readings from Last 3 Encounters:  03/30/22 215 lb 3.2 oz (97.6 kg)  09/29/21 216 lb 3.2 oz (98.1 kg)  09/20/20 210 lb 3.2 oz (95.3 kg)   Body mass index is 38.12 kg/m. /  He reports his diet consists of "too much sugar" He reports his exercise consists of regular daily cardiac or resistance using nike training app. Health maintenance and immunizations reviewed and he was encouraged to complete anything that is due: Immunization History  Administered Date(s) Administered   Influenza,inj,Quad PF,6+ Mos 02/03/2019   Influenza-Unspecified 01/23/2017   Moderna Sars-Covid-2 Vaccination 04/08/2020   PFIZER(Purple Top)SARS-COV-2 Vaccination 08/03/2019, 08/25/2019   Tdap 10/01/2018   There are no preventive care reminders to display for this patient.  Penile cancer screening: Asked about genital warts  or tumors/abnormalities of penis. Recommended Gardasil if none present, or cryo ablation if present Testicular cancer screening:  Patient was advised to palpate testicles, scrotum, penis for masses and inform me of any.   Prostate cancer screening:   Denies family history of prostate cancer or hematospermia so too young for screening by current guidelines. No results found for: "PSA"     STD screening: testing offered today, but patient declined as He considers themself to be low risk based on his sexual history    Sleep Apnea screening:  He  denies any significant problems with sleep quality or hypersomnolence or being advised that he has been told that he snores or has apnea- sometimes but feels he sleeps well and fit bit reassures no OXYGEN drop. Colon cancer screening:    Denies strong family history of colon cancer or blood in stool so no screening is indicated until age 46.  But with chronic gastrointestinal issues we are sending to gastrointestinal specialist Skin cancer screening-  Advised regular sunscreen use. He denies worrisome, changing, or new skin lesions. Showed him pictures of melanomas for reference: . There is tiny spot that keeps bleeding on left forearm that does not look like melanoma Substance use:  I discussed that my recommendation is total abstinence from all substances of abuse including smoke and 2nd hand smoke, alcohol, illicit drugs, smoking, inhalants, sugar.   Offered to assist with any use disorders or addictions.  His only substance use is sugar. Injury prevention: Discussed safety belts, safety helmets, smoke detectors. Return to care in 1 year for next preventative visit.   Lula Olszewski, MD     Subjective:   See problem-oriented charting in (overview sections of assessment & plan) for updated chart information added to chronic problems for future tracking  He has no acute complaints today.   Review of Systems  Constitutional:  Negative for chills, diaphoresis, fever, malaise/fatigue and weight loss.  HENT:  Positive for hearing loss. Negative for congestion, ear discharge, ear pain, nosebleeds, sinus pain, sore throat and tinnitus.   Eyes:  Negative for blurred vision, double vision, photophobia,  pain, discharge and redness.  Respiratory:  Negative for cough, hemoptysis, sputum production, shortness of breath, wheezing and stridor.   Cardiovascular:  Negative for chest pain, palpitations, orthopnea, claudication, leg swelling and PND.  Gastrointestinal:  Negative for abdominal pain, blood in stool, constipation, diarrhea, heartburn, melena, nausea and vomiting.  Genitourinary:  Negative for dysuria, flank pain, frequency, hematuria and urgency.  Musculoskeletal:  Negative for back pain, falls, joint pain, myalgias and neck pain.  Skin:  Negative for itching and rash.  Neurological:  Negative for dizziness, tingling, tremors, sensory change, speech change, focal weakness, seizures, loss of consciousness, weakness and headaches.  Endo/Heme/Allergies:  Negative for environmental allergies and polydipsia. Does not bruise/bleed easily.  Psychiatric/Behavioral:  Positive for depression. Negative for hallucinations, memory loss, substance abuse and suicidal ideas. The patient is not nervous/anxious and does not have insomnia.      I attest that I have reviewed and confirmed the patients current medications to meet the medication reconciliation requirement  Current Outpatient Medications  Medication Sig Dispense Refill   [START ON 04/03/2022] Amphetamine Sulfate (EVEKEO) 10 MG TABS Take 10 mg by mouth every morning AND 5 mg daily after lunch. 45 tablet 0   [START ON 05/01/2022] Amphetamine Sulfate (EVEKEO) 10 MG TABS Take 1 tablet in the morning and 1/2 tablet mid-day 45 tablet 0   [START ON 05/29/2022] Amphetamine Sulfate (EVEKEO)  10 MG TABS Take 1 tablet in the morning and 1/2 tablet mid-day 45 tablet 0   famotidine (PEPCID) 10 MG tablet Take 10 mg by mouth as needed for heartburn or indigestion.     hydrOXYzine (VISTARIL) 25 MG capsule Take 25 mg by mouth at bedtime as needed for nausea.     propranolol (INDERAL) 10 MG tablet TAKE 1 TO 2 TABLETS BY MOUTH TWICE A DAY AS NEEDED FOR ANXIETY 360  tablet 0   Vilazodone HCl 20 MG TABS Take 1 tablet (20 mg total) by mouth daily with breakfast. 90 tablet 1   cetirizine (ZYRTEC) 10 MG tablet Take 10 mg by mouth daily. (Patient not taking: Reported on 10/25/2021)     doxylamine, Sleep, (UNISOM) 25 MG tablet Take 12.5 mg by mouth at bedtime as needed. (Patient not taking: Reported on 03/30/2022)     No current facility-administered medications for this visit.  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 03/30/2022   FH: CAD (coronary artery disease) 03/30/2022   Inguinal hernia, left 09/29/2021   MDD (major depressive disorder), recurrent severe, without psychosis (HCC) 01/06/2021   Major depressive disorder, recurrent episode, moderate (HCC) 02/19/2018   Chronic idiopathic constipation 03/07/2017   Fitting or adjustment of artificial eye 10/27/2013   ADHD (attention deficit hyperactivity disorder) 10/23/2013   History of bunionectomy of left great toe 2008   Past Surgical History:  Procedure Laterality Date   BLEPHAROPLASTY Right    BUNIONECTOMY Left 2008   DORSAL COMPARTMENT RELEASE Right 01/14/2018   Procedure: RIGHT WRIST RELEASE DORSAL COMPARTMENT (DEQUERVAIN);  Surgeon: Cindee SaltKuzma, Gary, MD;  Location: [facility];  Service: Orthopedics;  Laterality: Right;   Family History  Problem Relation Age of Onset   Depression Mother    Diabetes Mother    Miscarriages / [place] Mother     Hearing loss Father    Hypertension Father    Mental illness Maternal Grandmother    Hypertension Paternal Grandfather    Stroke Paternal Grandfather    Depression Sister    Allergies  Allergen Reactions   Bupropion Other (See Comments)    Anger/ Enraged behavior   Social History   Tobacco Use   Smoking status: Never   Smokeless tobacco: Never  Vaping Use   Vaping Use: Never used  Substance Use Topics   Alcohol use: No   Drug use: No           Objective:  Physical Exam: BP 127/84 (BP Location: Right Arm, Patient Position: Sitting)   Pulse 82   Temp 98.1 F (36.7 C) (Temporal)   Resp 14   Ht 5\' 3"  (1.6 m)   Wt 215 lb 3.2 oz (97.6 kg)   SpO2 95%   BMI 38.12 kg/m   Body mass index is 38.12 kg/m. Wt Readings from Last 3 Encounters:  03/30/22 215 lb 3.2 oz (97.6 kg)  09/29/21 216 lb 3.2 oz (98.1 kg)  09/20/20 210 lb 3.2 oz (95.3 kg)   Gen: NAD, resting comfortably HEENT: TMs normal bilaterally. OP clear. No thyromegaly noted.  CV: RRR with no murmurs appreciated Pulm: NWOB, CTAB with no crackles, wheezes, or rhonchi GI: Normal bowel sounds present. Soft, Nontender, Nondistended. MSK: no edema, cyanosis, or clubbing noted Skin: warm, dry Neuro: CN2-12 grossly intact. Strength 5/5 in upper and lower extremities. Reflexes symmetric and intact bilaterally.  Psych: Normal affect and thought content Artificial eye on right almost undetectable

## 2022-03-30 NOTE — Assessment & Plan Note (Signed)
Offered gastrointestinal referral Has had multiple flares

## 2022-04-13 ENCOUNTER — Encounter: Payer: Self-pay | Admitting: Podiatry

## 2022-04-13 ENCOUNTER — Ambulatory Visit (INDEPENDENT_AMBULATORY_CARE_PROVIDER_SITE_OTHER): Payer: 59

## 2022-04-13 ENCOUNTER — Ambulatory Visit (INDEPENDENT_AMBULATORY_CARE_PROVIDER_SITE_OTHER): Payer: 59 | Admitting: Podiatry

## 2022-04-13 DIAGNOSIS — M722 Plantar fascial fibromatosis: Secondary | ICD-10-CM | POA: Diagnosis not present

## 2022-04-13 DIAGNOSIS — T1490XA Injury, unspecified, initial encounter: Secondary | ICD-10-CM | POA: Diagnosis not present

## 2022-04-13 MED ORDER — TRIAMCINOLONE ACETONIDE 10 MG/ML IJ SUSP
10.0000 mg | Freq: Once | INTRAMUSCULAR | Status: AC
  Administered 2022-04-13: 10 mg

## 2022-04-15 NOTE — Progress Notes (Signed)
Subjective:   Patient ID: Mark Fritz, male   DOB: 36 y.o.   MRN: [id number]   HPI Patient presents stating that he has developed pain in his right heel over the last couple months after Trail running.  States he does try to run and does hit into roots overall good health   ROS      Objective:  Physical Exam  Neurovascular status intact with patient found to have inflammation pain in the center and medial portion of the right heel with fluid buildup and painful when palpated     Assessment:  Acute fasciitis right with inflammation fluid around the medial band and into the central band     Plan:  H&P reviewed condition and went ahead today reviewed x-rays.  Then did sterile prep injected into the mid and central portion of the fascia at insertion 3 mg Kenalog 5 mg Xylocaine and applied fascial brace inserted into the arch to lift the arch up during gait.  Instructed on reduced activity stretching exercises and reappoint  X-rays indicate that there is no signs of stress fracture spurring or arthritis associated with condition

## 2022-06-07 ENCOUNTER — Encounter: Payer: Self-pay | Admitting: Gastroenterology

## 2022-06-08 ENCOUNTER — Ambulatory Visit (INDEPENDENT_AMBULATORY_CARE_PROVIDER_SITE_OTHER): Payer: 59

## 2022-06-08 DIAGNOSIS — M722 Plantar fascial fibromatosis: Secondary | ICD-10-CM | POA: Diagnosis not present

## 2022-06-08 NOTE — Progress Notes (Signed)
Patient presents today to pick up custom molded foot orthotics recommended by Dr. Paulla Dolly.   Orthotics were dispensed and fit was satisfactory. Reviewed instructions for break-in and wear. Written instructions given to patient.  Patient will follow up as needed.   [facility] - order # [id number]

## 2022-06-29 ENCOUNTER — Encounter: Payer: Self-pay | Admitting: Gastroenterology

## 2022-06-29 ENCOUNTER — Ambulatory Visit (INDEPENDENT_AMBULATORY_CARE_PROVIDER_SITE_OTHER): Payer: 59 | Admitting: Gastroenterology

## 2022-06-29 VITALS — BP 110/70 | HR 76 | Ht 67.5 in | Wt 216.2 lb

## 2022-06-29 DIAGNOSIS — K625 Hemorrhage of anus and rectum: Secondary | ICD-10-CM | POA: Diagnosis not present

## 2022-06-29 DIAGNOSIS — R198 Other specified symptoms and signs involving the digestive system and abdomen: Secondary | ICD-10-CM | POA: Diagnosis not present

## 2022-06-29 DIAGNOSIS — R194 Change in bowel habit: Secondary | ICD-10-CM | POA: Insufficient documentation

## 2022-06-29 NOTE — Progress Notes (Signed)
06/29/2022 GREGORIO GANTZ [id number] [birth year]   HISTORY OF PRESENT ILLNESS: This is a 37 year old male who is new to our office.  He is here today at request of Dr. Randol Kern for evaluation of chronic idiopathic constipation.  He tells me his bowels are really weird.  He says that it has been like this for a long time, years.  Not very irregular, inconsistent.  A lot of times they are very flat, sometimes floating.  Sometimes he has to go back and forth to the bathroom multiple times in the morning as if he has incomplete emptying.  Sometimes he has leakage of some stool.  Some discomfort on the left side intermittently, but not often.  He actually has had 2 CT scans for evaluation of this, one in 2018 and then one in May 2023 that showed moderate stool throughout the colon.  He says that more recently he has had a couple of episodes with a surprising amount of blood in the toilet bowl upon having a bowel movement.  Basic labs including TSH all unremarkable.   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 He has never used smokeless tobacco. He reports that he does not drink alcohol and does not use drugs. family history includes Anxiety disorder in his sister, sister, son, and son; Autism in his son; Depression in his maternal grandmother, mother, sister, and sister; Diabetes in his mother;  Hearing loss in his father; Hyperlipidemia in his father; Hypertension in his father and paternal grandfather; Mental illness in his maternal grandmother; Miscarriages / [place] in his mother; Obesity in his father, sister, and sister; Stroke in his paternal grandfather; Tics in his son. Allergies  Allergen Reactions   Bupropion Other (See Comments)    Anger/ Enraged behavior      Outpatient Encounter Medications as of 06/29/2022  Medication Sig   Amphetamine Sulfate (EVEKEO) 10 MG TABS Take 1 tablet in the morning and 1/2 tablet mid-day   famotidine (PEPCID) 10 MG tablet Take 10 mg by mouth as needed for heartburn or indigestion.   hydrOXYzine (VISTARIL) 25 MG capsule Take 25 mg by mouth at bedtime as needed for nausea.   propranolol (INDERAL) 10 MG tablet TAKE 1 TO 2 TABLETS BY MOUTH TWICE A DAY AS NEEDED FOR ANXIETY   Vilazodone HCl 20 MG TABS Take 1 tablet (20 mg total) by mouth daily with breakfast.   Amphetamine Sulfate (EVEKEO) 10 MG TABS Take 1 tablet in the morning and 1/2 tablet mid-day (Patient not taking: Reported on 06/29/2022)   cetirizine (ZYRTEC) 10 MG tablet Take 10 mg by mouth daily. (Patient not taking: Reported on 06/29/2022)   doxylamine, Sleep, (UNISOM) 25 MG tablet Take 12.5 mg by mouth at bedtime as needed. (Patient not taking: Reported on 06/29/2022)   [DISCONTINUED] Amphetamine Sulfate (  EVEKEO) 10 MG TABS Take 10 mg by mouth every morning AND 5 mg daily after lunch.   No facility-administered encounter medications on file as of 06/29/2022.     REVIEW OF SYSTEMS  : All other systems reviewed and negative except where noted in the History of Present Illness.   PHYSICAL EXAM: BP 110/70 (BP Location: Left Arm, Patient Position: Sitting, Cuff Size: Normal)   Pulse 76   Ht 5' 7.5" (1.715 m) Comment: height measured without shoes  Wt 216 lb 4 oz (98.1 kg)   BMI 33.37 kg/m  General: Well developed white male in no acute distress Head: Normocephalic and atraumatic Eyes:   Sclerae anicteric, conjunctiva pink. Ears: Normal auditory acuity Lungs: Clear throughout to auscultation; no W/R/R. Heart: Regular rate and rhythm; no M/R/G. Abdomen: Soft, non-distended.  BS present.  Non-tender. Rectal:  Will be done at the time of colonoscopy. Musculoskeletal: Symmetrical with no gross deformities  Skin: No lesions on visible extremities Extremities: No edema  Neurological: Alert oriented x 4, grossly non-focal Psychological:  Alert and cooperative. Normal mood and affect  ASSESSMENT AND PLAN: *37 year old male with irregular bowel habits and rectal bleeding: Reports irregular bowel habits for years and some intermittent left-sided abdominal pains.  Had a CT scan in 2018 and in May 2023 for evaluation of these issues.  Both of those suggested constipation and stool throughout the colon.  Recently had some episodes with moderate amount of blood in the toilet bowl.  Also describes sensation of incomplete emptying or evacuation and also some leakage.  Will then begin MiraLAX 1 capful mixed in 8 ounces of liquid daily.  Will also start Benefiber 2 teaspoons mixed in 8 ounces of liquid daily.  Will plan for colonoscopy with Dr. Candis Schatz.  The risks, benefits, and alternatives to colonoscopy were discussed with the patient and he consents to proceed.  CC:  Loralee Pacas, MD

## 2022-06-29 NOTE — Patient Instructions (Signed)
Start Miralax 1 capful daily in 8 ounces of liquid.  Start Benefiber 2 teaspoons in 8 ounces of liquid daily.  You have been scheduled for a colonoscopy. Please follow written instructions given to you at your visit today.  Please pick up your prep supplies at the pharmacy within the next 1-3 days. If you use inhalers (even only as needed), please bring them with you on the day of your procedure.  _______________________________________________________  If your blood pressure at your visit was 140/90 or greater, please contact your primary care physician to follow up on this.  _______________________________________________________  If you are age 38 or older, your body mass index should be between 23-30. Your Body mass index is 33.37 kg/m. If this is out of the aforementioned range listed, please consider follow up with your Primary Care Provider.  If you are age 55 or younger, your body mass index should be between 19-25. Your Body mass index is 33.37 kg/m. If this is out of the aformentioned range listed, please consider follow up with your Primary Care Provider.   ________________________________________________________  The [facility] providers would like to encourage you to use [facility] to communicate with providers for non-urgent requests or questions.  Due to long hold times on the telephone, sending your provider a message by [facility] may be a faster and more efficient way to get a response.  Please allow 48 business hours for a response.  Please remember that this is for non-urgent requests.  _______________________________________________________

## 2022-07-02 NOTE — Progress Notes (Signed)
Agree with the assessment and plan as outlined by Alonza Bogus, PA-C.  Hermon Zea E. Candis Schatz, MD  [facility]

## 2022-07-18 ENCOUNTER — Telehealth: Payer: Self-pay | Admitting: Psychiatry

## 2022-07-18 DIAGNOSIS — F902 Attention-deficit hyperactivity disorder, combined type: Secondary | ICD-10-CM

## 2022-07-18 MED ORDER — AMPHETAMINE SULFATE 10 MG PO TABS: ORAL_TABLET | ORAL | 0 refills | Status: DC

## 2022-07-18 NOTE — Telephone Encounter (Signed)
Script sent  

## 2022-07-18 NOTE — Telephone Encounter (Signed)
Pt called and said that he needs a refill on his evekeo 10 mg. Pharmacy is [facility] [address]. Due in may for an appointment

## 2022-08-10 ENCOUNTER — Ambulatory Visit (INDEPENDENT_AMBULATORY_CARE_PROVIDER_SITE_OTHER): Payer: 59

## 2022-08-10 ENCOUNTER — Ambulatory Visit (INDEPENDENT_AMBULATORY_CARE_PROVIDER_SITE_OTHER): Payer: 59 | Admitting: Sports Medicine

## 2022-08-10 VITALS — BP 120/80 | HR 87 | Ht 67.0 in | Wt 218.0 lb

## 2022-08-10 DIAGNOSIS — H5440 Blindness, one eye, unspecified eye: Secondary | ICD-10-CM | POA: Insufficient documentation

## 2022-08-10 DIAGNOSIS — M9908 Segmental and somatic dysfunction of rib cage: Secondary | ICD-10-CM | POA: Diagnosis not present

## 2022-08-10 DIAGNOSIS — M542 Cervicalgia: Secondary | ICD-10-CM | POA: Diagnosis not present

## 2022-08-10 DIAGNOSIS — M9902 Segmental and somatic dysfunction of thoracic region: Secondary | ICD-10-CM | POA: Diagnosis not present

## 2022-08-10 DIAGNOSIS — M9901 Segmental and somatic dysfunction of cervical region: Secondary | ICD-10-CM

## 2022-08-10 DIAGNOSIS — R0789 Other chest pain: Secondary | ICD-10-CM | POA: Diagnosis not present

## 2022-08-10 NOTE — Patient Instructions (Addendum)
Good to see you  Xrays on the way out  2-4 week follow up   

## 2022-08-10 NOTE — Progress Notes (Signed)
Mark Fritz D.[facility] [address] [place] [address] Phone: [phone]   Assessment and Plan:     1. Neck pain 2. Sternum pain 3. Somatic dysfunction of cervical region 4. Somatic dysfunction of thoracic region 5. Somatic dysfunction of rib region  -Chronic with exacerbation, initial sports medicine visit - Most consistent with rib dysfunction leading to tension along sternum.  I think patient mostly corrected the issue when he stretched his arms and felt a popping sensation in his sternum earlier today, however he is still having some residual discomfort - Due to chronic neck pain, we obtained x-ray at today's visit.  My interpretation: No acute fracture or dislocation.  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  Irregularity seen on odontoid view at left side.  No recent trauma.  No red flag signs.  Will await radiology review - Patient has received significant relief with OMT in the past.  Elects for repeat OMT today.  Tolerated well per note below. - Decision today to treat with OMT was based on Physical Exam  After verbal consent patient was treated with HVLA (high velocity low amplitude), ME (muscle energy), FPR (flex positional release), ST (soft tissue),  techniques in cervical, rib, thoracic, areas. Patient tolerated the procedure well with improvement in symptoms.  Patient educated on potential side effects of soreness and recommended to rest, hydrate, and use Tylenol as needed for pain control.   Pertinent previous records reviewed include none   Follow Up: 2 to 4 weeks for reevaluation.  If patient found today's OMT treatment beneficial, we could repeat it.  Could discuss C-spine x-ray   Subjective:    Chief Complaint: Sternal pain  HPI:   09/29/2021 Mark Fritz is a 37 y.o. male who presents to [facility] at [facility] today for L lower quadrant pain.  He was last seen by Dr. Denyse Amass  on 09/20/20 for R rib/trunk pain.  Today, pt reports pain in his L lower abdomen x 1 week, w/ pain drastically improving.  [place] and was going a lot of activity; kayaking, snorkeling etc. He was seen at the [facility] ED on 09/24/21 for these c/o and had a work-up including labs, urinalysis and CT abdomen/pelvis w/ contrast.  He locates his pain to the R-side of his abdomen. Pt was able to go for a run this morning.    Radiating pain: no Aggravating factors: hip flexor stretch Treatments tried: heat, Miramax   Diagnostic testing: CT abdomen pelvis w/ contrast- 09/24/21  08/10/22 Patient states that he has been having neck pain and seeing a massage therapist and chiro and now he has sternum popping not pain , now he has a tight sternum pain , is interested in OMT     Relevant Historical Information: Right eye blindness,  Additional pertinent review of systems negative.   Current Outpatient Medications:    Amphetamine Sulfate (EVEKEO) 10 MG TABS, Take 1 tablet in the morning and 1/2 tablet mid-day, Disp: 45 tablet, Rfl: 0   Amphetamine Sulfate (EVEKEO) 10 MG TABS, Take 1 tablet in the morning and 1/2 tablet mid-day, Disp: 45 tablet, Rfl: 0   cetirizine (ZYRTEC) 10 MG tablet, Take 10 mg by mouth daily., Disp: , Rfl:    doxylamine, Sleep, (UNISOM) 25 MG tablet, Take 12.5 mg by mouth at bedtime as needed., Disp: , Rfl:    famotidine (PEPCID) 10 MG tablet, Take 10 mg by  mouth as needed for heartburn or indigestion., Disp: , Rfl:    hydrOXYzine (VISTARIL) 25 MG capsule, Take 25 mg by mouth at bedtime as needed for nausea., Disp: , Rfl:    propranolol (INDERAL) 10 MG tablet, TAKE 1 TO 2 TABLETS BY MOUTH TWICE A DAY AS NEEDED FOR ANXIETY, Disp: 360 tablet, Rfl: 0   Vilazodone HCl 20 MG TABS, Take 1 tablet (20 mg total) by mouth daily with breakfast., Disp: 90 tablet, Rfl: 1   Objective:     Vitals:   08/10/22 1528  BP: 120/80  Pulse: 87  SpO2: 96%  Weight: 218 lb (98.9  kg)  Height:  (1.702 m)      Body mass index is 34.14 kg/m.    Physical Exam:    General: Well-appearing, cooperative, sitting comfortably in no acute distress.   OMT Physical Exam:  Cervical: TTP paraspinal, C3-5 RRSR Rib: TTP ribs 3-5 primarily on left at costosternal junction Thoracic: TTP paraspinal, T3-5 RLSR, T7-9 RRSL   Electronically signed by:  Mark Fritz D.[facility] 4:06 PM 08/10/22

## 2022-08-23 NOTE — Progress Notes (Signed)
Mark Fritz D.[facility] [address] [place] [address] Phone: [phone]   Assessment and Plan:     1. Neck pain 2. Sternum pain 3. Somatic dysfunction of cervical region 4. Somatic dysfunction of thoracic region 5. Somatic dysfunction of rib region  - Chronic with exacerbation, subsequent visit -While patient's rib cage dysfunction has had some improvement, he continues to have neck discomfort radiating into left shoulder and down left upper extremity.  His pain and radicular symptoms have been intermittent, however his numbness and tingling has been constant - Due to chronic nature of patient's symptoms, failure to improve with conservative therapy, x-ray images showing DDD of cervical spine, we will further evaluate with MRI of C-spine - Discussed OMT, however due to patient's radicular symptoms present at today's visit, decided to hold off until MRI results before repeating OMT - Start meloxicam 15 mg daily x2 weeks.  If still having pain after 2 weeks, complete 3rd-week of meloxicam. May use remaining meloxicam as needed once daily for pain control.  Do not to use additional NSAIDs while taking meloxicam.  May use Tylenol [phone] mg 2 to 3 times a day for breakthrough pain. - Continue HEP and start physical therapy - MR CERVICAL SPINE WO CONTRAST; Future - Ambulatory referral to Physical Therapy    Pertinent previous records reviewed include none   Follow Up: 3 days after MRI to review results and discuss treatment plan   Subjective:   I, Mark Fritz, am serving as a Neurosurgeon for Doctor Mark Fritz  Chief Complaint: Sternal pain   HPI:    09/29/2021 Mark Fritz is a 37 y.o. male who presents to [facility] at [facility] today for L lower quadrant pain.  He was last seen by Dr. Denyse Amass on 09/20/20 for R rib/trunk pain.  Today, pt reports pain in his L lower abdomen x 1 week, w/ pain drastically improving.  Pt  [place] and was going a lot of activity; kayaking, snorkeling etc. He was seen at the [facility] ED on 09/24/21 for these c/o and had a work-up including labs, urinalysis and CT abdomen/pelvis w/ contrast.  He locates his pain to the R-side of his abdomen. Pt was able to go for a run this morning.    Radiating pain: no Aggravating factors: hip flexor stretch Treatments tried: heat, Miramax   Diagnostic testing: CT abdomen pelvis w/ contrast- 09/24/21   08/10/22 Patient states that he has been having neck pain and seeing a massage therapist and chiro and now he has sternum popping not pain , now he has a tight sternum pain , is interested in OMT    08/24/2022 Patient states he has more tingling down his arm now , doesn't know if he is getting better       Relevant Historical Information: Right eye blindness,  Additional pertinent review of systems negative.   Current Outpatient Medications:    Amphetamine Sulfate (EVEKEO) 10 MG TABS, Take 1 tablet in the morning and 1/2 tablet mid-day, Disp: 45 tablet, Rfl: 0   Amphetamine Sulfate (EVEKEO) 10 MG TABS, Take 1 tablet in the morning and 1/2 tablet mid-day, Disp: 45 tablet, Rfl: 0   cetirizine (ZYRTEC) 10 MG tablet, Take 10 mg by mouth daily., Disp: , Rfl:    doxylamine, Sleep, (UNISOM) 25 MG tablet, Take 12.5 mg by mouth at bedtime as needed., Disp: , Rfl:    famotidine (PEPCID) 10 MG tablet,  Take 10 mg by mouth as needed for heartburn or indigestion., Disp: , Rfl:    hydrOXYzine (VISTARIL) 25 MG capsule, Take 25 mg by mouth at bedtime as needed for nausea., Disp: , Rfl:    meloxicam (MOBIC) 15 MG tablet, Take 1 tablet (15 mg total) by mouth daily., Disp: 30 tablet, Rfl: 0   propranolol (INDERAL) 10 MG tablet, TAKE 1 TO 2 TABLETS BY MOUTH TWICE A DAY AS NEEDED FOR ANXIETY, Disp: 360 tablet, Rfl: 0   Vilazodone HCl 20 MG TABS, Take 1 tablet (20 mg total) by mouth daily with breakfast., Disp: 90 tablet, Rfl: 1   Objective:      Vitals:   08/24/22 1520  BP: 122/80  Pulse: 90  SpO2: 97%  Weight: 218 lb (98.9 kg)  Height: 5\' 7"  (1.702 m)      Body mass index is 34.14 kg/m.    Physical Exam:    Neck Exam: Cervical Spine- Posture normal Skin- normal, intact  Neuro:  Strength-  Right Left   Deltoid (C5) 5/5 5/5  Bicep/Brachioradialis (C5/6) 5/5  5/5  Wrist Extension (C6) 5/5 5/5  Tricep (C7) 5/5 5/5  Wrist Flexion (C7) 5/5 5/5  Grip (C8) 5/5 5/5  Finger Abduction (T1) 5/5 5/5   Sensation: Decreased sensation to left upper extremity distal to elbow compared to right  Spurling's:  negative bilaterally Neck ROM: Full active ROM    Electronically signed by:  Mark Fritz [facility] 3:52 PM 08/24/22

## 2022-08-24 ENCOUNTER — Ambulatory Visit (INDEPENDENT_AMBULATORY_CARE_PROVIDER_SITE_OTHER): Payer: 59 | Admitting: Sports Medicine

## 2022-08-24 VITALS — BP 122/80 | HR 90 | Ht 67.0 in | Wt 218.0 lb

## 2022-08-24 DIAGNOSIS — M542 Cervicalgia: Secondary | ICD-10-CM | POA: Diagnosis not present

## 2022-08-24 DIAGNOSIS — R0789 Other chest pain: Secondary | ICD-10-CM | POA: Diagnosis not present

## 2022-08-24 DIAGNOSIS — M9901 Segmental and somatic dysfunction of cervical region: Secondary | ICD-10-CM | POA: Diagnosis not present

## 2022-08-24 DIAGNOSIS — M9908 Segmental and somatic dysfunction of rib cage: Secondary | ICD-10-CM

## 2022-08-24 DIAGNOSIS — M9902 Segmental and somatic dysfunction of thoracic region: Secondary | ICD-10-CM

## 2022-08-24 MED ORDER — MELOXICAM 15 MG PO TABS: 15.0000 mg | ORAL_TABLET | Freq: Every day | ORAL | 0 refills | Status: DC

## 2022-08-24 NOTE — Patient Instructions (Addendum)
Good to see you C spine MRI kerne - Start meloxicam 15 mg daily x2 weeks.  If still having pain after 2 weeks, complete 3rd-week of meloxicam. May use remaining meloxicam as needed once daily for pain control.  Do not to use additional NSAIDs while taking meloxicam.  May use Tylenol [phone] mg 2 to 3 times a day for breakthrough pain. PT referral  3 days after MRI if Mri is not approved we will see you in 3-4 weeks

## 2022-08-25 ENCOUNTER — Encounter: Payer: Self-pay | Admitting: Internal Medicine

## 2022-08-25 DIAGNOSIS — M1990 Unspecified osteoarthritis, unspecified site: Secondary | ICD-10-CM | POA: Insufficient documentation

## 2022-08-27 ENCOUNTER — Encounter: Payer: 59 | Admitting: Gastroenterology

## 2022-08-27 ENCOUNTER — Telehealth: Payer: Self-pay | Admitting: Psychiatry

## 2022-08-27 DIAGNOSIS — F902 Attention-deficit hyperactivity disorder, combined type: Secondary | ICD-10-CM

## 2022-08-27 MED ORDER — AMPHETAMINE SULFATE 10 MG PO TABS: ORAL_TABLET | ORAL | 0 refills | Status: DC

## 2022-08-27 NOTE — Telephone Encounter (Signed)
Pt called for refill of EVEKEO  to   [id number] [place], [place] - [address] [place], [place] [address] Phone: [phone]  Fax: [phone]   No upcoming appt scheduled.

## 2022-08-27 NOTE — Telephone Encounter (Signed)
Script sent  

## 2022-09-03 ENCOUNTER — Ambulatory Visit (INDEPENDENT_AMBULATORY_CARE_PROVIDER_SITE_OTHER): Payer: 59

## 2022-09-03 DIAGNOSIS — M9908 Segmental and somatic dysfunction of rib cage: Secondary | ICD-10-CM

## 2022-09-03 DIAGNOSIS — M79602 Pain in left arm: Secondary | ICD-10-CM

## 2022-09-03 DIAGNOSIS — M9902 Segmental and somatic dysfunction of thoracic region: Secondary | ICD-10-CM

## 2022-09-03 DIAGNOSIS — R0789 Other chest pain: Secondary | ICD-10-CM

## 2022-09-03 DIAGNOSIS — M9901 Segmental and somatic dysfunction of cervical region: Secondary | ICD-10-CM

## 2022-09-03 DIAGNOSIS — M542 Cervicalgia: Secondary | ICD-10-CM | POA: Diagnosis not present

## 2022-09-10 ENCOUNTER — Ambulatory Visit (INDEPENDENT_AMBULATORY_CARE_PROVIDER_SITE_OTHER): Payer: 59 | Admitting: Sports Medicine

## 2022-09-10 VITALS — BP 122/82 | HR 78 | Ht 67.0 in | Wt 219.0 lb

## 2022-09-10 DIAGNOSIS — M4802 Spinal stenosis, cervical region: Secondary | ICD-10-CM | POA: Diagnosis not present

## 2022-09-10 DIAGNOSIS — M542 Cervicalgia: Secondary | ICD-10-CM

## 2022-09-10 NOTE — Progress Notes (Signed)
Aleen Sells D.[facility] [address] [place] [address] Phone: [phone]   Assessment and Plan:     1. Neck pain 2. Spinal stenosis in cervical region  -Chronic with exacerbation, subsequent visit - Continued neck discomfort with left radicular symptoms consistent with findings seen on MRI C-spine - Reviewed imaging report of C-spine MRI with patient at visit today.  Showed levels of severe left neural foraminal stenosis at C5-6 and C6-7 and moderate spinal stenosis at same levels.  Also severe left foraminal stenosis at C4-5 - Discussed various options with patient including epidural CSI at C6-7 versus referral to neurosurgery.  Patient already reached out and schedule an appointment with neurosurgery to discuss injection versus surgical options.  I agree that is an appropriate next step - Discontinue meloxicam as it provided mild neck pain relief, but no change in radicular symptoms. - Offered gabapentin, but patient was concerned of side effects, so none prescribed today.  Pertinent previous records reviewed include C-spine MRI 09/03/2022   Follow Up: As needed   Subjective:   I, Jerene Canny, am serving as a Neurosurgeon for Doctor Richardean Sale   Chief Complaint: Sternal pain   HPI:    09/29/2021 KEROLOS PONZI is a 37 y.o. male who presents to [facility] at [facility] today for L lower quadrant pain.  He was last seen by Dr. Denyse Amass on 09/20/20 for R rib/trunk pain.  Today, pt reports pain in his L lower abdomen x 1 week, w/ pain drastically improving.  Pt [place] and was going a lot of activity; kayaking, snorkeling etc. He was seen at the [facility] ED on 09/24/21 for these c/o and had a work-up including labs, urinalysis and CT abdomen/pelvis w/ contrast.  He locates his pain to the R-side of his abdomen. Pt was able to go for a run this morning.    Radiating pain: no Aggravating factors: hip flexor  stretch Treatments tried: heat, Miramax   Diagnostic testing: CT abdomen pelvis w/ contrast- 09/24/21   08/10/22 Patient states that he has been having neck pain and seeing a massage therapist and chiro and now he has sternum popping not pain , now he has a tight sternum pain , is interested in OMT    08/24/2022 Patient states he has more tingling down his arm now , doesn't know if he is getting better    09/10/2022 Patient states he is the same , he had a  muscle spasm with  cold shock here for the MRI results ,    Relevant Historical Information: Right eye blindness,  Additional pertinent review of systems negative.   Current Outpatient Medications:    Amphetamine Sulfate (EVEKEO) 10 MG TABS, Take 1 tablet in the morning and 1/2 tablet mid-day, Disp: 45 tablet, Rfl: 0   Amphetamine Sulfate (EVEKEO) 10 MG TABS, Take 1 tablet in the morning and 1/2 tablet mid-day, Disp: 45 tablet, Rfl: 0   cetirizine (ZYRTEC) 10 MG tablet, Take 10 mg by mouth daily., Disp: , Rfl:    doxylamine, Sleep, (UNISOM) 25 MG tablet, Take 12.5 mg by mouth at bedtime as needed., Disp: , Rfl:    famotidine (PEPCID) 10 MG tablet, Take 10 mg by mouth as needed for heartburn or indigestion., Disp: , Rfl:    hydrOXYzine (VISTARIL) 25 MG capsule, Take 25 mg by mouth at bedtime as needed for nausea., Disp: , Rfl:    meloxicam (MOBIC) 15 MG  tablet, Take 1 tablet (15 mg total) by mouth daily., Disp: 30 tablet, Rfl: 0   propranolol (INDERAL) 10 MG tablet, TAKE 1 TO 2 TABLETS BY MOUTH TWICE A DAY AS NEEDED FOR ANXIETY, Disp: 360 tablet, Rfl: 0   Vilazodone HCl 20 MG TABS, Take 1 tablet (20 mg total) by mouth daily with breakfast., Disp: 90 tablet, Rfl: 1   Objective:     Vitals:   09/10/22 1239  BP: 122/82  Pulse: 78  SpO2: 98%  Weight: 219 lb (99.3 kg)  Height: 5\' 7"  (1.702 m)      Body mass index is 34.3 kg/m.    Physical Exam:    Neck Exam: Cervical Spine- Posture normal Skin- normal, intact   Neuro:   Strength-   Right Left  Deltoid (C5) 5/5 5/5 Bicep/Brachioradialis (C5/6) 5/5  5/5 Wrist Extension (C6) 5/5 5/5 Tricep (C7) 5/5 5/5 Wrist Flexion (C7) 5/5 5/5 Grip (C8) 5/5 5/5 Finger Abduction (T1) 5/5 5/5   Sensation: Decreased sensation to left upper extremity distal to elbow compared to right   Spurling's:  negative bilaterally Neck ROM: Full active ROM    Electronically signed by:  Aleen Sells [facility] 1:12 PM 09/10/22

## 2022-09-10 NOTE — Patient Instructions (Signed)
Good to see you   

## 2022-09-12 ENCOUNTER — Ambulatory Visit: Payer: 59 | Admitting: Physical Therapy

## 2022-09-21 ENCOUNTER — Other Ambulatory Visit: Payer: Self-pay | Admitting: Sports Medicine

## 2022-10-01 ENCOUNTER — Telehealth: Payer: Self-pay | Admitting: Psychiatry

## 2022-10-01 ENCOUNTER — Other Ambulatory Visit: Payer: Self-pay

## 2022-10-01 DIAGNOSIS — F902 Attention-deficit hyperactivity disorder, combined type: Secondary | ICD-10-CM

## 2022-10-01 MED ORDER — AMPHETAMINE SULFATE 10 MG PO TABS: ORAL_TABLET | ORAL | 0 refills | Status: DC

## 2022-10-01 NOTE — Telephone Encounter (Signed)
Pended.

## 2022-10-01 NOTE — Telephone Encounter (Signed)
Mark Fritz called and LM at 2:30 and I returned his call at 3:50.  He made apt for 6/5 but he needs a refill of his Evekeo.  [facility] to [id number] - [place], [place] - [address]

## 2022-10-03 ENCOUNTER — Encounter: Payer: Self-pay | Admitting: Psychiatry

## 2022-10-03 ENCOUNTER — Telehealth (INDEPENDENT_AMBULATORY_CARE_PROVIDER_SITE_OTHER): Payer: 59 | Admitting: Psychiatry

## 2022-10-03 DIAGNOSIS — F411 Generalized anxiety disorder: Secondary | ICD-10-CM | POA: Diagnosis not present

## 2022-10-03 DIAGNOSIS — F3342 Major depressive disorder, recurrent, in full remission: Secondary | ICD-10-CM | POA: Diagnosis not present

## 2022-10-03 DIAGNOSIS — F902 Attention-deficit hyperactivity disorder, combined type: Secondary | ICD-10-CM

## 2022-10-03 MED ORDER — AMPHETAMINE SULFATE 10 MG PO TABS: 10.0000 mg | ORAL_TABLET | Freq: Two times a day (BID) | ORAL | 0 refills | Status: DC

## 2022-10-03 MED ORDER — VILAZODONE HCL 20 MG PO TABS: 20.0000 mg | ORAL_TABLET | Freq: Every day | ORAL | 1 refills | Status: DC

## 2022-10-03 NOTE — Progress Notes (Signed)
KE BUNGAY [id number] [birth year] 37 y.o.  Virtual Visit via Video Note  I connected with pt @ on 10/03/22 at  1:00 PM EDT by a video enabled telemedicine application and verified that I am speaking with the correct person using two identifiers.   I discussed the limitations of evaluation and management by telemedicine and the availability of in person appointments. The patient expressed understanding and agreed to proceed.  I discussed the assessment and treatment plan with the patient. The patient was provided an opportunity to ask questions and all were answered. The patient agreed with the plan and demonstrated an understanding of the instructions.   The patient was advised to call back or seek an in-person evaluation if the symptoms worsen or if the condition fails to improve as anticipated.  I provided 33 minutes of non-face-to-face time during this encounter.  The patient was located at home.  The provider was located at home.   Mark Fritz, PMHNP   Subjective:   Patient ID:  Mark Fritz is a 37 y.o. (DOB [birth year]) male.  Chief Complaint:  Chief Complaint  Patient presents with   Follow-up    Anxiety, depression, and ADHD    HPI Mark Fritz presents for follow-up of anxiety, depression, and ADHD. He reports that he has been well overall. He has been having nerve pain in his right upper extremity and has been told he may need surgery. He reports that he had some mild depression in response to increase pain with limited physical activity and disrupted sleep). Caffeine intake has increased and thinks that this may be exacerbating anxiety. Denies any panic attacks. He reports that anxiety tends to present more as worry. He reports some mild depression. He reports some mild irritability. Energy and motivation is "pretty good." Appetite has been "normal." He reports that he has had some difficulty with concentration at times, and occasionally will need Evekeo 10 mg po BID.  Denies SI.  Nephew passed away (wife's sister's son died in MVA the night before his graduation). Mother had to go to the ER last night and thought it was a heart attack. Today is youngest son's birthday.   Evekeo last filled on 10/01/22.  Has been using a self- talk app and finds this helpful.   Past Psychiatric Medication Trials: Sertraline-Effective. Multiple adverse effects (initial GI side effects, insomnia, cognitive side effects, sexual side effects) Trintellix- Not effective and caused some "fuzzy head" Viibryd- Some sexual side effects starting at 40 mg Wellbutrin- irritability Adderall- decreased appetite, increased irritability Evekeo- effective for attention deficit with out significant tolerability issues Lithium Hydroxyzine- causing excessive drowsiness Propranolol- Helpful for anxiety. Noticed mood was lower after taking it.  Gabapentin- Adverse effects (tired, excessive sedation, difficulty with concentration, emotional changes)  Review of Systems:  Review of Systems  Cardiovascular:  Negative for chest pain and palpitations.  Musculoskeletal:  Negative for gait problem.  Neurological:        Nerve pain in upper left extremity.   Psychiatric/Behavioral:         Please refer to HPI    Medications: I have reviewed the patient's current medications.  Current Outpatient Medications  Medication Sig Dispense Refill   acetaminophen (TYLENOL) 325 MG tablet Take 650 mg by mouth every 6 (six) hours as needed.     [START ON 12/24/2022] Amphetamine Sulfate (EVEKEO) 10 MG TABS Take 1 tablet (10 mg total) by mouth 2 (two) times daily. 60 tablet 0   ASHWAGANDHA PO Take by  mouth.     doxylamine, Sleep, (UNISOM) 25 MG tablet Take 12.5 mg by mouth at bedtime as needed.     famotidine (PEPCID) 10 MG tablet Take 10 mg by mouth as needed for heartburn or indigestion.     hydrOXYzine (VISTARIL) 25 MG capsule Take 25 mg by mouth at bedtime as needed for nausea.     naproxen sodium  (ALEVE) 220 MG tablet Take 220 mg by mouth daily as needed.     [START ON 10/29/2022] Amphetamine Sulfate (EVEKEO) 10 MG TABS Take 1 tablet (10 mg total) by mouth 2 (two) times daily. 60 tablet 0   [START ON 11/26/2022] Amphetamine Sulfate (EVEKEO) 10 MG TABS Take 1 tablet (10 mg total) by mouth 2 (two) times daily. 60 tablet 0   cetirizine (ZYRTEC) 10 MG tablet Take 10 mg by mouth daily. (Patient not taking: Reported on 10/03/2022)     meloxicam (MOBIC) 15 MG tablet Take 1 tablet (15 mg total) by mouth daily. (Patient not taking: Reported on 10/03/2022) 30 tablet 0   Vilazodone HCl 20 MG TABS Take 1 tablet (20 mg total) by mouth daily with breakfast. 90 tablet 1   No current facility-administered medications for this visit.    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    Family History  Problem Relation Age of Onset   Depression Mother    Diabetes Mother    Miscarriages / [place] Mother    Hearing loss Father    Hypertension Father    Hyperlipidemia Father    Obesity Father    Depression Sister    Anxiety disorder Sister    Obesity Sister    Anxiety disorder Sister    Depression Sister    Obesity Sister    Mental illness Maternal Grandmother    Depression Maternal Grandmother    Hypertension Paternal Grandfather    Stroke Paternal Grandfather    Autism Son    Anxiety disorder Son    Tics Son    Anxiety disorder Son     Social History   Socioeconomic History   Marital status: Married    Spouse name: Not on file   Number of children: 3   Years of education: Not on file   Highest education level:  Not on file  Occupational History   Occupation: Clinical research associate  Tobacco Use   Smoking status: Never   Smokeless tobacco: Never  Vaping Use   Vaping Use: Never used  Substance and Sexual Activity   Alcohol use: No   Drug use: No   Sexual activity: Yes  Other Topics Concern   Not on file  Social History Narrative   Not on file   Social Determinants of Health   Financial Resource Strain: Not on file  Food Insecurity: Not on file  Transportation Needs: Not on file  Physical Activity: Not on file  Stress: Not on file  Social Connections: Not on file  Intimate Partner Violence: Not on file    Past Medical History, Surgical history, Social history, and Family history  were reviewed and updated as appropriate.   Please see review of systems for further details on the patient's review from today.   Objective:   Physical Exam:  BP 123/78   Wt 215 lb (97.5 kg)   BMI 33.67 kg/m   Physical Exam Neurological:     Mental Status: He is alert and oriented to person, place, and time.     Cranial Nerves: No dysarthria.  Psychiatric:        Attention and Perception: Attention and perception normal.        Mood and Affect: Mood is not anxious.        Speech: Speech normal.        Behavior: Behavior is cooperative.        Thought Content: Thought content normal. Thought content is not paranoid or delusional. Thought content does not include homicidal or suicidal ideation. Thought content does not include homicidal or suicidal plan.        Cognition and Memory: Cognition and memory normal.        Judgment: Judgment normal.     Comments: Insight intact Mood is mildly depressed     Lab Review:     Component Value Date/Time   NA 138 03/30/2022 0935   K 4.3 03/30/2022 0935   CL 100 03/30/2022 0935   CO2 29 03/30/2022 0935   GLUCOSE 92 03/30/2022 0935   BUN 17 03/30/2022 0935   CREATININE 1.14 03/30/2022 0935   CALCIUM 10.2 03/30/2022 0935   PROT 7.9 03/30/2022 0935   ALBUMIN 5.1  03/30/2022 0935   AST 26 03/30/2022 0935   ALT 43 03/30/2022 0935   ALKPHOS 60 03/30/2022 0935   BILITOT 0.7 03/30/2022 0935   GFRNONAA >60 09/24/2021 0819       Component Value Date/Time   WBC 7.7 03/30/2022 0935   RBC 5.49 03/30/2022 0935   HGB 15.7 03/30/2022 0935   HCT 46.0 03/30/2022 0935   PLT 269.0 03/30/2022 0935   MCV 83.7 03/30/2022 0935   MCH 27.5 09/24/2021 0819   MCHC 34.1 03/30/2022 0935   RDW 13.4 03/30/2022 0935   LYMPHSABS 2.5 03/30/2022 0935   MONOABS 0.5 03/30/2022 0935   EOSABS 0.1 03/30/2022 0935   BASOSABS 0.0 03/30/2022 0935    No results found for: "POCLITH", "LITHIUM"   No results found for: "PHENYTOIN", "PHENOBARB", "VALPROATE", "CBMZ"   .res Assessment: Plan:   Discussed option of increasing Viibryd 30 mg qd if depression worsens or does not improve. He reports that he would like to continue Viibryd 20 mg at this time and instead focus on non-pharmacological strategies to improve his mood and anxiety, to include reducing caffeine intake, increasing physical activity, etc. Encouraged pt to contact office if depression worsens or does not improve and Viibryd could be increased to 30 mg po qd.  Will increase Evekeo to 10 mg po BID for ADHD since pt reports that some days he is needing to take 1 tablet twice daily. Pt to follow-up in 6 months or sooner if clinically indicated.  Requested pt call in 3 months to provide update and request additional scripts.  Patient advised to contact office with any questions, adverse effects, or acute worsening in signs and symptoms.  Bowan was seen today for follow-up.  Diagnoses and all orders for this visit:  Attention deficit hyperactivity disorder (ADHD), combined type -     Amphetamine Sulfate (EVEKEO) 10 MG TABS; Take 1 tablet (10 mg total) by mouth 2 (two) times daily. -  Amphetamine Sulfate (EVEKEO) 10 MG TABS; Take 1 tablet (10 mg total) by mouth 2 (two) times daily. -     Amphetamine Sulfate (EVEKEO)  10 MG TABS; Take 1 tablet (10 mg total) by mouth 2 (two) times daily.  Generalized anxiety disorder -     Vilazodone HCl 20 MG TABS; Take 1 tablet (20 mg total) by mouth daily with breakfast.  Recurrent major depressive disorder, in full remission (HCC) -     Vilazodone HCl 20 MG TABS; Take 1 tablet (20 mg total) by mouth daily with breakfast.     Please see After Visit Summary for patient specific instructions.  Future Appointments  Date Time Provider Department Center  04/05/2023  8:00 AM Lula Olszewski, MD [facility]    No orders of the defined types were placed in this encounter.     -------------------------------

## 2022-10-09 ENCOUNTER — Encounter: Payer: Self-pay | Admitting: Internal Medicine

## 2022-10-09 ENCOUNTER — Ambulatory Visit (INDEPENDENT_AMBULATORY_CARE_PROVIDER_SITE_OTHER): Payer: 59 | Admitting: Internal Medicine

## 2022-10-09 VITALS — BP 110/82 | HR 115 | Temp 98.2°F | Ht 67.0 in | Wt 218.4 lb

## 2022-10-09 DIAGNOSIS — R000 Tachycardia, unspecified: Secondary | ICD-10-CM | POA: Diagnosis not present

## 2022-10-09 DIAGNOSIS — K611 Rectal abscess: Secondary | ICD-10-CM | POA: Diagnosis not present

## 2022-10-09 MED ORDER — CEPHALEXIN 500 MG PO CAPS: 500.0000 mg | ORAL_CAPSULE | Freq: Three times a day (TID) | ORAL | 0 refills | Status: DC

## 2022-10-09 MED ORDER — DOXYCYCLINE HYCLATE 100 MG PO TABS: 100.0000 mg | ORAL_TABLET | Freq: Two times a day (BID) | ORAL | 0 refills | Status: DC

## 2022-10-09 NOTE — Progress Notes (Signed)
[facility]: [phone]   Routine Medical Office Visit  Patient:  Mark Fritz      Age: 37 y.o.       Sex:  male  Date:   10/09/2022 PCP:    Lula Olszewski, MD   Today's Healthcare Provider: Lula Olszewski, MD   Assessment and Plan:   Perianal Abscess: He has a large, indurated, painful abscess on the medial aspect of the right gluteus with associated cellulitis but no history of immunosuppression. The abscess is not drainable at this time. We will start Cephalexin 500mg  three times daily for 10 days and Doxycycline twice daily. A referral to a surgical specialist for further evaluation and possible intervention is necessary. He should seek immediate medical attention if the abscess enlarges, if fevers and chills develop, if the abscess becomes fluctuant, or if he experiences dizziness due to a drop in blood pressure.  Tachycardia: His heart rate is elevated to 115, likely secondary to pain from the abscess. He should increase fluid intake.  Follow-up: We will monitor his response to antibiotics and assess the need for surgical intervention.     Through a comprehensive analysis of anonymized patient data, we informed an AI-generated report. All AI suggestions underwent careful evaluation against established guidelines to ensure optimal patient care. During our collaborative review of the report's findings, we discussed potential diagnoses, recommended tests, treatment options, and next steps. I explained the rationale behind the AI recommendations, integrating my clinical judgment for the patient's understanding.  In collaboration with the patient, we explored the potential benefits and risks associated with certain categories of AI-suggested interventions. For example, we discussed the advantages and considerations of more aggressive treatment options. Ultimately, the patient elected to closely monitor the situation based on their preferences and our shared  understanding of their condition. Patient's best interests and evidence-based practices guided all decisions, respecting their autonomy. Moving forward, we will continue close monitoring and revisit these options at the next visit or sooner if their condition changes. The patient was encouraged to keep me informed of any developments."  AI-Generated Report (red):  The Question 37 year old male presents complaining of about three to six days of worsening pain in his  region and on examination he has a large integrated region on the medial aspect of his right gluteus with Cellulitis spreading up about halfway around the gluteus on the right. It's very tender to palpation and very underrated with no fluctuance. He has no history of immunosuppression but does have a history of hemorrhoids OK the abscess does not dive into the anal canal but it does abut the rectum without surrounding it. HR is 115 due to pain vitals otherwise normal. Would like to try antibiotics. Which ones should be given, and what should prompt ER evaluations.  Design Strategy To address this question, we leverage a comprehensive understanding of the management of cellulitis, particularly focusing on antibiotic selection and indications for emergency evaluation. The strategy involves analyzing the patient's clinical presentation, including the severity of cellulitis, absence of fluctuance suggesting an abscess, and systemic signs such as tachycardia. We will consider the most appropriate antibiotics for this type of infection and outline criteria for when emergency room evaluation is warranted.  Execute Strategy Given the description, the patient appears to have non-purulent cellulitis without systemic signs of severe infection, such as fever or hypotension, but with significant pain and tachycardia. The choice of antibiotics should cover common causative organisms of cellulitis, primarily beta-hemolytic streptococci and  methicillin-sensitive  Staphylococcus aureus (MSSA).  - Antibiotic Choices:   - Dicloxacillin 500 mg orally every 6 hours .   - Cephalexin 500 mg orally every 6 hours .   - If allergic to penicillin: Clindamycin 300-450 mg orally every 6 hours.  Systematically Ensure Accuracy & Precision Re-evaluating the patient's presentation and the management options, the absence of fluctuance reduces the likelihood of an abscess, supporting the use of systemic antibiotics for cellulitis without the need for surgical intervention. The patient's tachycardia is likely due to pain and should be monitored, but in the absence of other systemic signs of severe infection, outpatient management with oral antibiotics is appropriate.  Criteria for ER evaluation should include: - Increase in the area of redness or swelling. - Development of systemic symptoms like fever or chills. - Signs of an abscess such as fluctuance. - Severe pain not controlled with oral analgesics. - Any signs of hemodynamic instability.  Final Answer The recommended antibiotics for this patient are Dicloxacillin 500 mg orally every 6 hours or Cephalexin 500 mg orally every 6 hours, considering no known drug allergies. If allergic to penicillin, Clindamycin 300-450 mg orally every 6 hours should be used. The patient should seek ER evaluation if there is an increase in the area of redness or swelling, development of systemic symptoms, signs of an abscess, severe pain not controlled with oral analgesics, or any signs of hemodynamic instability.   Discussed risks/benefits and decided to add doxycycline on top of keflex to get more comprehensive coverage because the abscess is large and because he hopes to improve soon and will need to go to ER if he doesnt. Meds ordered this encounter  Medications   cephALEXin (KEFLEX) 500 MG capsule    Sig: Take 1 capsule (500 mg total) by mouth 3 (three) times daily for 10 days.    Dispense:  30 capsule     Refill:  0   doxycycline (VIBRA-TABS) 100 MG tablet    Sig: Take 1 tablet (100 mg total) by mouth 2 (two) times daily.    Dispense:  20 tablet    Refill:  0            Clinical Presentation:    37 y.o. male here today for Possible hemorrhoids (External area of anus. Feels tight, swollen and warm to the touch. Has used preparation H and suppositories with no relief. Started around Sunday.)  AI-Extracted: Discussed the use of AI scribe software for clinical note transcription with the patient, who gave verbal consent to proceed.  History of Present Illness   The patient, a 37 year old individual with a history of hemorrhoids, presents with a 3-6 day history of worsening pain in the gluteal region. The pain is localized to the right medial aspect of the gluteus and has been progressively worsening. The patient reports no history of immunosuppression. The discomfort is described as burning and painful, likened to an abscess. The patient denies any discomfort within the anal canal, indicating that the pain is entirely external. The patient also reports experiencing body aches and some dizziness. The onset of the symptoms was sudden, with the patient noting a significant increase in discomfort starting from the previous Sunday. The patient denies any prior instances of similar symptoms that have caused significant discomfort or required medical intervention.        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 imbalance 03/30/2022   FH: CAD  (coronary artery disease) 03/30/2022   Rectal bleeding 06/29/2022   Change in bowel habits 06/29/2022   Irregular bowel habits 06/29/2022   Blindness of right eye 08/10/2022   Arthritis 08/25/2022   Resolved Ambulatory Problems    Diagnosis Date Noted   Mesenteric adenitis 03/07/2017   De Quervain's tenosynovitis, right 03/19/2017   Past Medical History:  Diagnosis Date   Anxiety    Dental crowns present    Depression    History of gastric ulcer    Prosthetic eye globe     Outpatient Medications Prior to Visit  Medication Sig   acetaminophen (TYLENOL) 325 MG tablet Take 650 mg by mouth every 6 (six) hours as needed.   [START ON 10/29/2022] Amphetamine Sulfate (EVEKEO) 10 MG TABS Take 1 tablet (10 mg total) by mouth 2 (two) times daily.   [START ON 11/26/2022] Amphetamine Sulfate (EVEKEO) 10 MG TABS Take 1 tablet (10 mg total) by mouth 2 (two) times daily.   [START ON 12/24/2022] Amphetamine Sulfate (EVEKEO) 10 MG TABS Take 1 tablet (10 mg total) by mouth 2 (two) times daily.   ASHWAGANDHA PO Take by mouth.   cetirizine (ZYRTEC) 10 MG tablet Take 10 mg by mouth daily.   doxylamine, Sleep, (UNISOM) 25 MG tablet Take 12.5 mg by mouth at bedtime as needed.   famotidine (PEPCID) 10 MG tablet Take 10 mg by mouth as needed for heartburn or indigestion.   hydrOXYzine (VISTARIL) 25 MG capsule Take 25 mg by mouth at bedtime as needed for nausea.   meloxicam (MOBIC) 15 MG tablet Take 1 tablet (15 mg total) by mouth daily.   naproxen sodium (ALEVE) 220 MG tablet Take 220 mg by mouth daily as needed.   Vilazodone HCl 20 MG TABS Take 1 tablet (20 mg total) by mouth daily with breakfast.   No facility-administered medications prior to visit.         Clinical Data Analysis:   Physical Exam  BP 110/82 (BP Location: Left Arm, Patient Position: Sitting)   Pulse (!) 115   Temp 98.2 F (36.8 C) (Temporal)   Ht 5\' 7"  (1.702 m)   Wt 218 lb 6.4 oz (99.1 kg)   SpO2 97%   BMI 34.21 kg/m  Wt  Readings from Last 10 Encounters:  10/09/22 218 lb 6.4 oz (99.1 kg)  09/10/22 219 lb (99.3 kg)  08/24/22 218 lb (98.9 kg)  08/10/22 218 lb (98.9 kg)  06/29/22 216 lb 4 oz (98.1 kg)  03/30/22 215 lb 3.2 oz (97.6 kg)  09/29/21 216 lb 3.2 oz (98.1 kg)  09/20/20 210 lb 3.2 oz (95.3 kg)  08/24/19 208 lb 9.6 oz (94.6 kg)  06/01/19 203 lb (92.1 kg)    Photographs Taken 10/09/2022 :      Vital signs reviewed.  Nursing notes reviewed. Weight trend reviewed. Abnormalities and Problem-Specific physical exam findings:  abscess as shown has marked tenderness and induration with no areas of fluctuance.  General Appearance:  No acute distress appreciable.   Well-groomed, healthy-appearing male.  Well proportioned with no abnormal fat distribution.  Good muscle tone. Skin: Clear and well-hydrated. Pulmonary:  Normal work of breathing at rest, no  respiratory distress apparent. SpO2: 97 %  Musculoskeletal: All extremities are intact.  Neurological:  Awake, alert, oriented, and engaged.  No obvious focal neurological deficits or cognitive impairments.  Sensorium seems unclouded.   Speech is clear and coherent with logical content. Psychiatric:  Appropriate mood, pleasant and cooperative demeanor, thoughtful and engaged during the exam  Results Reviewed:    No results found for any visits on 10/09/22.  No results found for this or any previous visit (from the past 2160 hour(s)).  No image results found.   MR CERVICAL SPINE WO CONTRAST  Result Date: 09/07/2022 CLINICAL DATA:  Left-sided neck and arm pain. EXAM: MRI CERVICAL SPINE WITHOUT CONTRAST TECHNIQUE: Multiplanar, multisequence MR imaging of the cervical spine was performed. No intravenous contrast was administered. COMPARISON:  Cervical spine radiographs 08/10/2022 FINDINGS: Alignment: Straightening of the normal cervical lordosis. No significant listhesis. Vertebrae: No fracture or suspicious marrow lesion. Moderate Modic type 2 and minimal  Modic type 1 degenerative endplate changes at C6-7. Cord: Normal signal. Posterior Fossa, vertebral arteries, paraspinal tissues: Preserved vertebral artery flow voids, with the left vertebral artery incidentally noted to not enter the transverse foramina until the C4 level (normal variant). Unremarkable included posterior fossa. Disc levels: C2-3: Mild disc bulging, uncovertebral spurring, and severe facet arthrosis with bilateral facet joint effusions. Mild bilateral neural foraminal stenosis. No spinal stenosis. C3-4: Right eccentric disc bulging, right greater than left uncovertebral spurring, moderate facet arthrosis result in mild spinal stenosis and moderate to severe right and mild left neural foraminal stenosis. C4-5: Mild disc bulging, uncovertebral spurring, and moderate left facet arthrosis result in severe left neural foraminal stenosis without significant spinal stenosis. C5-6: Disc bulging, a left paracentral to foraminal disc protrusion, uncovertebral spurring, and mild facet arthrosis result in moderate spinal stenosis with left-sided cord flattening and mild right and severe left neural foraminal stenosis. Left C6 nerve root impingement is likely. C6-7: Disc bulging, uncovertebral spurring, and a left paracentral to left foraminal disc osteophyte complex result in moderate spinal stenosis with cord flattening and mild-to-moderate right and severe left neural foraminal stenosis, likely with left C7 nerve root impingement. C7-T1: Minor disc bulging, uncovertebral spurring, and moderate facet arthrosis result in moderate right and borderline to mild left neural foraminal stenosis without spinal stenosis. IMPRESSION: 1. Multilevel cervical disc and facet degeneration, most notable at C5-6 and C6-7 where there is moderate spinal stenosis and severe left neural foraminal stenosis. 2. Severe left neural foraminal stenosis at C4-5. Electronically Signed   By: Sebastian Ache M.D.   On: 09/07/2022 08:48   DG  Cervical Spine 2 or 3 views  Result Date: 08/12/2022 CLINICAL DATA:  neck pain EXAM: CERVICAL SPINE - 4 VIEW COMPARISON:  None Available. FINDINGS: No fracture, dislocation or subluxation. No spondylolisthesis. No osteolytic or osteoblastic changes. Prevertebral and cervical cranial soft tissues are unremarkable. Degenerative disc disease noted with disc space narrowing and marginal osteophytes at C6-7. IMPRESSION: Degenerative changes. No acute osseous abnormalities. Electronically Signed   By: Layla Maw M.D.   On: 08/12/2022 22:57       Signed: Lula Olszewski, MD 10/09/2022 1:45 PM  This encounter employed real-time, collaborative documentation. The patient actively reviewed and updated their medical record on a shared screen, ensuring transparency and facilitating joint problem-solving for the problem list, overview, and plan. This approach promotes accurate, informed care. The treatment plan was discussed and reviewed in detail, including medication safety, potential side effects, and all patient questions. We confirmed understanding and comfort with  the plan. Follow-up instructions were established, including contacting the office for any concerns, returning if symptoms worsen, persist, or new symptoms develop, and precautions for potential emergency department visits. ----------------------------------------------------- Lula Olszewski, MD  10/09/2022 1:45 PM  [facility]:  [phone]

## 2022-10-09 NOTE — Patient Instructions (Signed)
It was a pleasure seeing you today! Your health and satisfaction are our top priorities.   Glenetta Hew, MD  VISIT SUMMARY:  During your visit, we discussed your recent discomfort in the gluteal region, which has been worsening over the past few days. We identified a large, painful abscess on the right side of your buttocks, which is causing the pain. We also noted that your heart rate is slightly elevated, likely due to the pain from the abscess.  YOUR PLAN:  -PERIANAL ABSCESS: An abscess is a painful collection of pus, usually caused by a bacterial infection. In your case, the abscess is located near your buttocks. We have prescribed antibiotics (Cephalexin and Doxycycline) to help fight the infection. We have also referred you to a surgical specialist for further evaluation and possible treatment. It's important to seek immediate medical attention if the abscess gets larger, if you develop fevers and chills, if the abscess becomes soft and fluctuant, or if you feel dizzy due to a drop in blood pressure.  -TACHYCARDIA: Tachycardia is a condition where your heart rate is higher than normal. In your case, it's likely due to the pain from the abscess. Increasing your fluid intake can help manage this.  INSTRUCTIONS:  Please start taking the prescribed antibiotics as directed. Monitor your symptoms closely and seek immediate medical attention if they worsen. Increase your fluid intake to help manage your elevated heart rate. We will follow up with you to monitor your response to the antibiotics and assess the need for surgical intervention.   Next Steps:  [x]  Flexible Follow-Up: We recommend a follow up with your primary care, a specialist, or me within 1-2 weeks for ensuring your problem resolves. This allows for progress monitoring and treatment adjustments. [x]  Early Intervention: Schedule sooner appointment, call our on-call services, or go to emergency room if there is Increase in pain or  discomfort New or worsening symptoms Sudden or severe changes in your health [x]  Lab & X-ray Appointments: complete or schedule to complete today, or call to schedule.  X-rays: [facility] (M-F, 8:30am-noon or 1pm-5pm).  Making the Most of Our Focused (20 minute) Follow Up Appointments:  [x]   Clearly state your top concerns at the beginning of the visit to focus our discussion [x]   If you anticipate you will need more time, please inform the front desk during scheduling - we can book multiple appointments in the same week. [x]   If you have transportation problems- use our convenient video appointments or ask about transportation support. [x]   We can get down to business faster if you use MyChart to update information before the visit and submit non-urgent questions before your visit. Thank you for taking the time to provide details through MyChart.  Let our nurse know and she can import this information into your encounter documents.  Arrival and Wait Times: [x]   Arriving on time ensures that everyone receives prompt attention. [x]   Early morning (8a) and afternoon (1p) appointments tend to have shortest wait times. [x]   Unfortunately, we cannot delay appointments for late arrivals or hold slots during phone calls.  Getting Answers and Following Up  [x]   Simple Questions & Concerns: For quick questions or basic follow-up after your visit, reach [place] at [phone] or MyChart messaging. [x]   Complex Concerns: If your concern is more complex, scheduling an appointment might be best. Discuss this with the staff to find the most suitable option. [x]   Lab & Imaging Results: We'll contact you  directly if results are abnormal or you don't use MyChart. Most normal results will be on MyChart within 2-3 business days, with a review message from Dr. Jon Billings. Haven't heard back in 2 weeks? Need results sooner? Contact us at [phone]. [x]   Referrals: Our referral coordinator will  manage specialist referrals. The specialist's office should contact you within 2 weeks to schedule an appointment. Call us if you haven't heard from them after 2 weeks.  Staying Connected  [x]   MyChart: Activate your MyChart for the fastest way to access results and message [place]. See the last page of this paperwork for instructions on how to activate.  Bring to Your Next Appointment  [x]   Medications: Please bring all your medication bottles to your next appointment to ensure we have an accurate record of your prescriptions. [x]   Health Diaries: If you're monitoring any health conditions at home, keeping a diary of your readings can be very helpful for discussions at your next appointment.  Billing  [x]   X-ray & Lab Orders: These are billed by separate companies. Contact the invoicing company directly for questions or concerns. [x]   Visit Charges: Discuss any billing inquiries with our administrative services team.  Your Satisfaction Matters  [x]   Share Your Experience: We strive for your satisfaction! If you have any complaints, or preferably compliments, please let Dr. Jon Billings know directly or contact our Practice Administrators, Edwena Felty or [employer], by asking at the front desk.   Reviewing Your Records  [x]   Review this early draft of your clinical encounter notes below and the final encounter summary tomorrow on MyChart after its been completed.   Rectal abscess -     Cephalexin; Take 1 capsule (500 mg total) by mouth 3 (three) times daily for 10 days.  Dispense: 30 capsule; Refill: 0 -     Doxycycline Hyclate; Take 1 tablet (100 mg total) by mouth 2 (two) times daily.  Dispense: 20 tablet; Refill: 0

## 2022-10-13 ENCOUNTER — Emergency Department (HOSPITAL_BASED_OUTPATIENT_CLINIC_OR_DEPARTMENT_OTHER)
Admission: EM | Admit: 2022-10-13 | Discharge: 2022-10-13 | Disposition: A | Discharge status: Home / Self Care | Payer: 59 | Attending: Emergency Medicine | Admitting: Emergency Medicine

## 2022-10-13 ENCOUNTER — Other Ambulatory Visit: Payer: Self-pay

## 2022-10-13 ENCOUNTER — Encounter (HOSPITAL_BASED_OUTPATIENT_CLINIC_OR_DEPARTMENT_OTHER): Payer: Self-pay | Admitting: Emergency Medicine

## 2022-10-13 DIAGNOSIS — M542 Cervicalgia: Secondary | ICD-10-CM | POA: Insufficient documentation

## 2022-10-13 DIAGNOSIS — F32A Depression, unspecified: Secondary | ICD-10-CM | POA: Insufficient documentation

## 2022-10-13 DIAGNOSIS — G8929 Other chronic pain: Secondary | ICD-10-CM | POA: Insufficient documentation

## 2022-10-13 MED ORDER — OXYCODONE-ACETAMINOPHEN 5-325 MG PO TABS: 1.0000 | ORAL_TABLET | Freq: Four times a day (QID) | ORAL | 0 refills | Status: DC | PRN

## 2022-10-13 MED ORDER — OXYCODONE-ACETAMINOPHEN 5-325 MG PO TABS
2.0000 | ORAL_TABLET | Freq: Once | ORAL | Status: AC
  Administered 2022-10-13: 2 via ORAL
  Filled 2022-10-13: qty 2

## 2022-10-13 MED ORDER — BACLOFEN 10 MG PO TABS: 10.0000 mg | ORAL_TABLET | Freq: Three times a day (TID) | ORAL | 0 refills | Status: DC

## 2022-10-13 NOTE — ED Provider Notes (Signed)
[place] EMERGENCY DEPARTMENT AT [facility] Provider Note   CSN: [id number] Arrival date & time: 10/13/22  7829     History  Chief Complaint  Patient presents with   Suicidal    Mark Fritz is a 37 y.o. male.  37 year old male presents due to chronic pain from known cervical disc disease.  Patient has been seen by spine surgeon and has been treated with multiple medications tried alleviate his symptoms.  He denies any new injuries.  No bowel or bladder dysfunction.  No new weakness.  States the pain has been very frustrating and that yesterday he overdid it and the pain got worse.  Became very upset and felt it with his underlying depression worse.  Did not sleep.  States that he is so frustrated with the pain that he felt he would want to end it all.  He denies any prior history of suicide attempt.  His wife is at bedside.  Patient states he feels better after he had some rest.  No longer feels suicidal.  His wife agrees with this.  Is currently only using Tylenol for pain       Home Medications Prior to Admission medications   Medication Sig Start Date End Date Taking? Authorizing Provider  acetaminophen (TYLENOL) 325 MG tablet Take 650 mg by mouth every 6 (six) hours as needed.    [provider]  Amphetamine Sulfate (EVEKEO) 10 MG TABS Take 1 tablet (10 mg total) by mouth 2 (two) times daily. 10/29/22   Corie Chiquito, PMHNP  Amphetamine Sulfate (EVEKEO) 10 MG TABS Take 1 tablet (10 mg total) by mouth 2 (two) times daily. 11/26/22   Corie Chiquito, PMHNP  Amphetamine Sulfate (EVEKEO) 10 MG TABS Take 1 tablet (10 mg total) by mouth 2 (two) times daily. 12/24/22   Corie Chiquito, PMHNP  ASHWAGANDHA PO Take by mouth.    [provider]  cephALEXin (KEFLEX) 500 MG capsule Take 1 capsule (500 mg total) by mouth 3 (three) times daily for 10 days. 10/09/22 10/19/22  Lula Olszewski, MD  cetirizine (ZYRTEC) 10 MG tablet Take 10 mg by mouth daily.     [provider]  doxycycline (VIBRA-TABS) 100 MG tablet Take 1 tablet (100 mg total) by mouth 2 (two) times daily. 10/09/22   Lula Olszewski, MD  doxylamine, Sleep, (UNISOM) 25 MG tablet Take 12.5 mg by mouth at bedtime as needed.    [provider]  famotidine (PEPCID) 10 MG tablet Take 10 mg by mouth as needed for heartburn or indigestion.    [provider]  hydrOXYzine (VISTARIL) 25 MG capsule Take 25 mg by mouth at bedtime as needed for nausea.    [provider]  meloxicam (MOBIC) 15 MG tablet Take 1 tablet (15 mg total) by mouth daily. 08/24/22   Richardean Sale, DO  naproxen sodium (ALEVE) 220 MG tablet Take 220 mg by mouth daily as needed.    [provider]  Vilazodone HCl 20 MG TABS Take 1 tablet (20 mg total) by mouth daily with breakfast. 10/03/22   Corie Chiquito, PMHNP      Allergies    Bupropion    Review of Systems   Review of Systems  All other systems reviewed and are negative.   Physical Exam Updated Vital Signs BP (!) 146/98   Pulse 62   Temp 97.8 F (36.6 C) (Oral)   Resp 20   SpO2 99%  Physical Exam Vitals and nursing note reviewed.  Constitutional:      General: He is not in acute distress.    Appearance: Normal appearance. He is well-developed. He is not toxic-appearing.  HENT:     Head: Normocephalic and atraumatic.  Eyes:     General: Lids are normal.     Conjunctiva/sclera: Conjunctivae normal.     Pupils: Pupils are equal, round, and reactive to light.  Neck:     Thyroid: No thyroid mass.     Trachea: No tracheal deviation.  Cardiovascular:     Rate and Rhythm: Normal rate and regular rhythm.     Heart sounds: Normal heart sounds. No murmur heard.    No gallop.  Pulmonary:     Effort: Pulmonary effort is normal. No respiratory distress.     Breath sounds: Normal breath sounds. No stridor. No decreased breath sounds, wheezing, rhonchi or rales.  Abdominal:     General: There is no distension.      Palpations: Abdomen is soft.     Tenderness: There is no abdominal tenderness. There is no rebound.  Musculoskeletal:        General: No tenderness. Normal range of motion.     Cervical back: Normal range of motion and neck supple.  Skin:    General: Skin is warm and dry.     Findings: No abrasion or rash.  Neurological:     General: No focal deficit present.     Mental Status: He is alert and oriented to person, place, and time. Mental status is at baseline.     GCS: GCS eye subscore is 4. GCS verbal subscore is 5. GCS motor subscore is 6.     Cranial Nerves: No cranial nerve deficit.     Sensory: No sensory deficit.     Motor: Motor function is intact.  Psychiatric:        Attention and Perception: Attention normal.        Speech: Speech normal.        Behavior: Behavior normal. Behavior is cooperative.        Thought Content: Thought content does not include suicidal ideation. Thought content does not include suicidal plan.        Judgment: Judgment normal.     ED Results / Procedures / Treatments   Labs (all labs ordered are listed, but only abnormal results are displayed) Labs Reviewed - No data to display  EKG None  Radiology No results found.  Procedures Procedures    Medications Ordered in ED Medications  oxyCODONE-acetaminophen (PERCOCET/ROXICET) 5-325 MG per tablet 2 tablet (has no administration in time range)    ED Course/ Medical Decision Making/ A&P                             Medical Decision Making Risk Prescription drug management.   Patient is neurological exam is stable at this time.  No indication for imaging.  Had long discussion with patient and wife who is at bedside.  Do not feel that patient has any acute psychiatric condition requiring discussion with a behavioral health specialist.  Patient psychiatric symptoms stem from his frustration with his chronic pain in his neck.  Plan will be for patient to be placed on short course of opiates  as well as baclofen.  I will give patient referral to a spine surgeon.  Wife feels that patient is not in danger of harming himself at this time and patient agrees with this.  Return precautions given        Final Clinical Impression(s) / ED Diagnoses Final diagnoses:  None    Rx / DC Orders ED Discharge Orders     None         Lorre Nick, MD 10/13/22 [phone]

## 2022-10-13 NOTE — ED Triage Notes (Signed)
Pt has cervical issues,being followed by spinal MD, was prescribed gabapentin and prednisone. Stopped gabapentin due to side effects. Pt has finished prednisone for a week or two. Pt also has abscess on his buttocks, is on antibiotics for that. Pt is in lots of pain,can't sleep ,feeling depressed, says last night he wanted to end it all. Pt does have hx of depression, is on antidepressant, has had SI in the past and a plan ,but no action taken.(This was a year or so ago).

## 2022-10-13 NOTE — Discharge Instructions (Addendum)
Go to [facility] if you develop new weakness in your arms, unrelenting pain, or any other problems

## 2022-10-16 ENCOUNTER — Encounter: Payer: Self-pay | Admitting: Internal Medicine

## 2022-10-16 DIAGNOSIS — M47812 Spondylosis without myelopathy or radiculopathy, cervical region: Secondary | ICD-10-CM | POA: Insufficient documentation

## 2022-10-17 ENCOUNTER — Ambulatory Visit (INDEPENDENT_AMBULATORY_CARE_PROVIDER_SITE_OTHER): Payer: 59 | Admitting: Internal Medicine

## 2022-10-17 ENCOUNTER — Encounter: Payer: Self-pay | Admitting: Internal Medicine

## 2022-10-17 VITALS — BP 136/85 | HR 99 | Temp 97.7°F | Ht 67.0 in | Wt 218.4 lb

## 2022-10-17 DIAGNOSIS — M47812 Spondylosis without myelopathy or radiculopathy, cervical region: Secondary | ICD-10-CM

## 2022-10-17 DIAGNOSIS — R030 Elevated blood-pressure reading, without diagnosis of hypertension: Secondary | ICD-10-CM | POA: Diagnosis not present

## 2022-10-17 DIAGNOSIS — M1990 Unspecified osteoarthritis, unspecified site: Secondary | ICD-10-CM | POA: Diagnosis not present

## 2022-10-17 DIAGNOSIS — F331 Major depressive disorder, recurrent, moderate: Secondary | ICD-10-CM

## 2022-10-17 DIAGNOSIS — K611 Rectal abscess: Secondary | ICD-10-CM

## 2022-10-17 MED ORDER — CELECOXIB 200 MG PO CAPS
200.0000 mg | ORAL_CAPSULE | Freq: Two times a day (BID) | ORAL | 5 refills | Status: DC
Start: 2022-10-17 — End: 2023-04-05

## 2022-10-17 MED ORDER — DOXYCYCLINE HYCLATE 100 MG PO TABS
100.0000 mg | ORAL_TABLET | Freq: Two times a day (BID) | ORAL | 0 refills | Status: DC
Start: 2022-10-17 — End: 2022-12-03

## 2022-10-17 MED ORDER — CEPHALEXIN 500 MG PO CAPS
500.0000 mg | ORAL_CAPSULE | Freq: Three times a day (TID) | ORAL | 0 refills | Status: AC
Start: 2022-10-17 — End: 2022-10-22

## 2022-10-17 NOTE — Patient Instructions (Signed)
VISIT SUMMARY:  During your recent visit, we discussed your neck pain, rectal abscess, and elevated blood pressure. Your neck pain has improved significantly with the help of prescribed medications. Your rectal abscess has also shown significant improvement with antibiotics. Your blood pressure is slightly elevated, but still below the threshold of concern. We will continue to monitor these conditions.  YOUR PLAN:  -HYPERTENSION: Hypertension is a condition where the force of the blood against the artery walls is too high. Your blood pressure is slightly elevated, but still below the threshold of concern. We will continue to monitor this.  -RECTAL ABSCESS: A rectal abscess is a pocket of pus that forms near the rectum due to infection. Your abscess has shown significant improvement with antibiotics. Continue taking the antibiotics for the remaining 3 days, and use the additional 5 days of antibiotics only if symptoms persist. Continue consuming probiotics during and after the antibiotic course.  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 Your neck pain has improved significantly with the help of prescribed medications. We have scheduled a steroid injection for July 9th for long-term relief and a consultation with a spine surgeon on July 31st. Avoid activities that require looking upwards to prevent further strain and pain. We are also considering Celebrex for pain management.  INSTRUCTIONS:  Your annual routine visit is scheduled for December. No immediate follow-up is required with primary care. Continue taking your medications as prescribed and maintain your regular exercise routine. If you notice any changes in your symptoms or have any concerns, please contact us.

## 2022-10-17 NOTE — Progress Notes (Signed)
Mark Fritz: [phone]   Routine Medical Office Visit  Patient:  Mark Fritz      Age: 37 y.o.       Sex:  male  Date:   10/17/2022 PCP:    Lula Olszewski, MD   Today's Healthcare Provider: Lula Olszewski, MD   Assessment and Plan:    [name] was seen today for one week follow-up, rectal abscess and neck pain.  Arthritis Overview:  07/2022 consult 1. Neck pain 2. Sternum pain 3. Somatic dysfunction of cervical region 4. Somatic dysfunction of thoracic region 5. Somatic dysfunction of rib region  - Chronic with exacerbation, subsequent visit -While patient's rib cage dysfunction has had some improvement, he continues to have neck discomfort radiating into left shoulder and down left upper extremity.  His pain and radicular symptoms have been intermittent, however his numbness and tingling has been constant - Due to chronic nature of patient's symptoms, failure to improve with conservative therapy, x-ray images showing DDD of cervical spine, we will further evaluate with MRI of C-spine - Discussed OMT, however due to patient's radicular symptoms present at today's visit, decided to hold off until MRI results before repeating OMT - Start meloxicam 15 mg daily x2 weeks.  If still having pain after 2 weeks, complete 3rd-week of meloxicam. May use remaining meloxicam as needed once daily for pain control.  Do not to use additional NSAIDs while taking meloxicam.  May use Tylenol [phone] mg 2 to 3 times a day for breakthrough pain. - Continue HEP and start physical therapy - MR CERVICAL SPINE WO CONTRAST; Future - Ambulatory referral to Physical Therapy    Orders: -     Celecoxib; Take 1 capsule (200 mg total) by mouth 2 (two) times daily.  Dispense: 60 capsule; Refill: 5  Rectal abscess Assessment & Plan: Rectal Abscess: They have shown significant improvement with Doxycycline and Cephalexin. We will continue antibiotics for the remaining 3 days and  have prescribed an additional 5 days of antibiotics as a precaution, to be used only if symptoms persist. They are encouraged to intake probiotics during and after the antibiotic course.  Orders: -     Doxycycline Hyclate; Take 1 tablet (100 mg total) by mouth 2 (two) times daily for 5 days.  Dispense: 10 tablet; Refill: 0 -     Cephalexin; Take 1 capsule (500 mg total) by mouth 3 (three) times daily for 5 days.  Dispense: 15 capsule; Refill: 0  Osteoarthritis of cervical spine, unspecified spinal osteoarthritis complication status Overview: Emergency room visit for severe pain enough to cause transient suicidal ideation 10/13/22  Cervical Spine Degeneration: They are experiencing a severe neck pain flare-up post physical exertion and strain, currently managed with Baclofen and Percocet, with a steroid injection scheduled for July 9th for long-term relief. We will continue the current pain management regimen, have scheduled a consultation with a spine surgeon on July 31st, and advised them to avoid activities that require looking upwards to prevent further strain and pain. We are also considering Celebrex for pain management.  Orders: -     Celecoxib; Take 1 capsule (200 mg total) by mouth 2 (two) times daily.  Dispense: 60 capsule; Refill: 5 -     Ambulatory referral for Acupuncture  Major depressive disorder, recurrent episode, moderate (HCC) Overview: Not on typical antidepressant but takes propranolol for anxiety Also takes Vibryd Hydroxyzine is for sleep.  Assessment & Plan: Brief suicidal ideation due to intractable pain but doing  better now.  He has good plan to manage the pain.   Elevated blood pressure reading Overview: Their blood pressure is elevated at 136/85, which is below the threshold of 140/90. We will continue current management.  Assessment & Plan: Advised patient get omron blood pressure cuff and let me know if over 140/90            Clinical Presentation:     37 y.o. male here today for One week follow-up, rectal abscess (Taking antibiotic(s) doxycycline and cephalexin/Reports its almost 100% better. Doesn't think he will need any more than 3 days he has left.  ), and Neck Pain (Chronic, but recent flare, worsened by [place] open,  baclofen and percocet from ER helping.  Improving. Following with spine specialist long term and scheduled for more injections)  AI-Extracted: Discussed the use of AI scribe software for clinical note transcription with the patient, who gave verbal consent to proceed.  History of Present Illness   The patient, with a history of neck degeneration and recent rectal abscess, presents for follow-up. They report a recent severe flare of neck pain, which they attribute to overexertion and poor posture during a recent golf tournament. The pain was so severe that it necessitated an ER visit, where they were prescribed baclofen and Percocet. The patient reports that the pain has since improved significantly, to the point where they can function during the day without the Percocet.  Regarding the rectal abscess, the patient reports significant improvement, to the point where they no longer notice it. They were prescribed doxycycline and cephalexin, of which they have about three days left. They believe the abscess has resolved, but are open to continuing the antibiotics if necessary. They have been consuming yogurt and probiotics alongside the antibiotics to mitigate potential side effects.  The patient also reports a recent increase in blood pressure, with readings of 130/90 and 136/85. They speculate that this could be due to the pain and stress from their neck issues. They have been managing their blood pressure with regular exercise, including running on trails with inclines. They note that uphill running is less strenuous on their knees and more beneficial for muscle building.  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 They also have a family history of fused vertebrae in the neck. They have been advised to avoid activities that require looking up to prevent further strain on the neck. They have a neck brace which they use occasionally for support.        Reviewed chart data: Active Ambulatory Problems    Diagnosis Date Noted   Chronic idiopathic constipation 03/07/2017   ADHD (attention deficit hyperactivity disorder) 10/23/2013   Fitting or adjustment of artificial eye 10/27/2013   Major depressive disorder, recurrent episode, moderate (HCC) 02/19/2018   MDD (major depressive disorder), recurrent severe, without psychosis (HCC) 01/06/2021   Inguinal hernia, left 09/29/2021   History of bunionectomy of left great toe 2008   Obesity due to energy imbalance 03/30/2022   FH: CAD (coronary artery disease) 03/30/2022   Rectal bleeding 06/29/2022   Change in bowel habits 06/29/2022   Irregular bowel habits 06/29/2022   Blindness of right eye 08/10/2022   Arthritis 08/25/2022   Rectal abscess 10/09/2022   Right wrist pain 12/09/2019   Primary osteoarthritis of right hand 01/01/2020   Numbness 12/23/2017   DJD (degenerative joint disease) of  cervical spine 10/16/2022   Elevated blood pressure reading 10/17/2022   Resolved Ambulatory Problems    Diagnosis Date Noted   Mesenteric adenitis 03/07/2017   De Quervain's tenosynovitis, right 03/19/2017   Past Medical History:  Diagnosis Date   Anxiety    Dental crowns present    Depression    History of gastric ulcer    Prosthetic eye globe     Outpatient Medications Prior to Visit  Medication Sig   acetaminophen (TYLENOL) 325 MG tablet Take 650 mg by mouth every 6 (six) hours as needed.   [START ON 10/29/2022] Amphetamine Sulfate (EVEKEO) 10 MG TABS Take 1 tablet (10 mg total) by mouth 2 (two) times daily.   [START ON 11/26/2022] Amphetamine Sulfate (EVEKEO) 10 MG  TABS Take 1 tablet (10 mg total) by mouth 2 (two) times daily.   [START ON 12/24/2022] Amphetamine Sulfate (EVEKEO) 10 MG TABS Take 1 tablet (10 mg total) by mouth 2 (two) times daily.   ASHWAGANDHA PO Take by mouth.   baclofen (LIORESAL) 10 MG tablet Take 1 tablet (10 mg total) by mouth 3 (three) times daily.   cetirizine (ZYRTEC) 10 MG tablet Take 10 mg by mouth daily.   doxylamine, Sleep, (UNISOM) 25 MG tablet Take 12.5 mg by mouth at bedtime as needed.   famotidine (PEPCID) 10 MG tablet Take 10 mg by mouth as needed for heartburn or indigestion.   hydrOXYzine (VISTARIL) 25 MG capsule Take 25 mg by mouth at bedtime as needed for nausea.   oxyCODONE-acetaminophen (PERCOCET/ROXICET) 5-325 MG tablet Take 1 tablet by mouth every 6 (six) hours as needed for severe pain.   Vilazodone HCl 20 MG TABS Take 1 tablet (20 mg total) by mouth daily with breakfast.   [DISCONTINUED] cephALEXin (KEFLEX) 500 MG capsule Take 1 capsule (500 mg total) by mouth 3 (three) times daily for 10 days.   [DISCONTINUED] doxycycline (VIBRA-TABS) 100 MG tablet Take 1 tablet (100 mg total) by mouth 2 (two) times daily.   [DISCONTINUED] meloxicam (MOBIC) 15 MG tablet Take 1 tablet (15 mg total) by mouth daily.   [DISCONTINUED] naproxen sodium (ALEVE) 220 MG tablet Take 220 mg by mouth daily as needed.   No facility-administered medications prior to visit.         Clinical Data Analysis:   Physical Exam  BP 136/85   Pulse 99   Temp 97.7 F (36.5 C) (Temporal)   Ht 5\' 7"  (1.702 m)   Wt 218 lb 6.4 oz (99.1 kg)   SpO2 97%   BMI 34.21 kg/m  Wt Readings from Last 10 Encounters:  10/17/22 218 lb 6.4 oz (99.1 kg)  10/09/22 218 lb 6.4 oz (99.1 kg)  09/10/22 219 lb (99.3 kg)  08/24/22 218 lb (98.9 kg)  08/10/22 218 lb (98.9 kg)  06/29/22 216 lb 4 oz (98.1 kg)  03/30/22 215 lb 3.2 oz (97.6 kg)  09/29/21 216 lb 3.2 oz (98.1 kg)  09/20/20 210 lb 3.2 oz (95.3 kg)  08/24/19 208 lb 9.6 oz (94.6 kg)   Vital signs reviewed.   Nursing notes reviewed. Weight trend reviewed. Abnormalities and Problem-Specific physical exam findings:  truncal adiposity mild, muscular frame  General Appearance:  No acute distress appreciable.   Well-groomed, healthy-appearing male.  Well proportioned with no abnormal fat distribution.  Good muscle tone. Skin: Clear and well-hydrated. Pulmonary:  Normal work of breathing at rest, no respiratory distress apparent. SpO2: 97 %  Musculoskeletal: All extremities are intact.  Neurological:  Awake, alert, oriented,  and engaged.  No obvious focal neurological deficits or cognitive impairments.  Sensorium seems unclouded.   Speech is clear and coherent with logical content. Psychiatric:  Appropriate mood, pleasant and cooperative demeanor, thoughtful and engaged during the exam  Results Reviewed:    No results found for any visits on 10/17/22.  Office Visit on 03/30/2022  Component Date Value   WBC 03/30/2022 7.7    RBC 03/30/2022 5.49    Hemoglobin 03/30/2022 15.7    HCT 03/30/2022 46.0    MCV 03/30/2022 83.7    MCHC 03/30/2022 34.1    RDW 03/30/2022 13.4    Platelets 03/30/2022 269.0    Neutrophils Relative % 03/30/2022 59.8    Lymphocytes Relative 03/30/2022 32.4    Monocytes Relative 03/30/2022 6.2    Eosinophils Relative 03/30/2022 1.0    Basophils Relative 03/30/2022 0.6    Neutro Abs 03/30/2022 4.6    Lymphs Abs 03/30/2022 2.5    Monocytes Absolute 03/30/2022 0.5    Eosinophils Absolute 03/30/2022 0.1    Basophils Absolute 03/30/2022 0.0    Sodium 03/30/2022 138    Potassium 03/30/2022 4.3    Chloride 03/30/2022 100    CO2 03/30/2022 29    Glucose, Bld 03/30/2022 92    BUN 03/30/2022 17    Creatinine, Ser 03/30/2022 1.14    Total Bilirubin 03/30/2022 0.7    Alkaline Phosphatase 03/30/2022 60    AST 03/30/2022 26    ALT 03/30/2022 43    Total Protein 03/30/2022 7.9    Albumin 03/30/2022 5.1    GFR 03/30/2022 81.63    Calcium 03/30/2022 10.2    Cholesterol  03/30/2022 228 (H)    Triglycerides 03/30/2022 122.0    HDL 03/30/2022 65.50    VLDL 03/30/2022 24.4    LDL Cholesterol 03/30/2022 139 (H)    Total CHOL/HDL Ratio 03/30/2022 3    NonHDL 03/30/2022 162.98    Hgb A1c MFr Bld 03/30/2022 5.9    TSH 03/30/2022 1.17    No image results found.   MR CERVICAL SPINE WO CONTRAST  Result Date: 09/07/2022 CLINICAL DATA:  Left-sided neck and arm pain. EXAM: MRI CERVICAL SPINE WITHOUT CONTRAST TECHNIQUE: Multiplanar, multisequence MR imaging of the cervical spine was performed. No intravenous contrast was administered. COMPARISON:  Cervical spine radiographs 08/10/2022 FINDINGS: Alignment: Straightening of the normal cervical lordosis. No significant listhesis. Vertebrae: No fracture or suspicious marrow lesion. Moderate Modic type 2 and minimal Modic type 1 degenerative endplate changes at C6-7. Cord: Normal signal. Posterior Fossa, vertebral arteries, paraspinal tissues: Preserved vertebral artery flow voids, with the left vertebral artery incidentally noted to not enter the transverse foramina until the C4 level (normal variant). Unremarkable included posterior fossa. Disc levels: C2-3: Mild disc bulging, uncovertebral spurring, and severe facet arthrosis with bilateral facet joint effusions. Mild bilateral neural foraminal stenosis. No spinal stenosis. C3-4: Right eccentric disc bulging, right greater than left uncovertebral spurring, moderate facet arthrosis result in mild spinal stenosis and moderate to severe right and mild left neural foraminal stenosis. C4-5: Mild disc bulging, uncovertebral spurring, and moderate left facet arthrosis result in severe left neural foraminal stenosis without significant spinal stenosis. C5-6: Disc bulging, a left paracentral to foraminal disc protrusion, uncovertebral spurring, and mild facet arthrosis result in moderate spinal stenosis with left-sided cord flattening and mild right and severe left neural foraminal stenosis.  Left C6 nerve root impingement is likely. C6-7: Disc bulging, uncovertebral spurring, and a left paracentral to left foraminal disc osteophyte complex result in moderate spinal stenosis  with cord flattening and mild-to-moderate right and severe left neural foraminal stenosis, likely with left C7 nerve root impingement. C7-T1: Minor disc bulging, uncovertebral spurring, and moderate facet arthrosis result in moderate right and borderline to mild left neural foraminal stenosis without spinal stenosis. IMPRESSION: 1. Multilevel cervical disc and facet degeneration, most notable at C5-6 and C6-7 where there is moderate spinal stenosis and severe left neural foraminal stenosis. 2. Severe left neural foraminal stenosis at C4-5. Electronically Signed   By: Sebastian Ache M.D.   On: 09/07/2022 08:48   DG Cervical Spine 2 or 3 views  Result Date: 08/12/2022 CLINICAL DATA:  neck pain EXAM: CERVICAL SPINE - 4 VIEW COMPARISON:  None Available. FINDINGS: No fracture, dislocation or subluxation. No spondylolisthesis. No osteolytic or osteoblastic changes. Prevertebral and cervical cranial soft tissues are unremarkable. Degenerative disc disease noted with disc space narrowing and marginal osteophytes at C6-7. IMPRESSION: Degenerative changes. No acute osseous abnormalities. Electronically Signed   By: Layla Maw M.D.   On: 08/12/2022 22:57       This encounter employed real-time, collaborative documentation. The patient actively reviewed and updated their medical record on a shared screen, ensuring transparency and facilitating joint problem-solving for the problem list, overview, and plan. This approach promotes accurate, informed care. The treatment plan was discussed and reviewed in detail, including medication safety, potential side effects, and all patient questions. We confirmed understanding and comfort with the plan. Follow-up instructions were established, including contacting the office for any concerns,  returning if symptoms worsen, persist, or new symptoms develop, and precautions for potential emergency department visits. ----------------------------------------------------- Lula Olszewski, MD  10/17/2022 8:59 AM  [facility]:  [phone]

## 2022-10-17 NOTE — Assessment & Plan Note (Signed)
Rectal Abscess: They have shown significant improvement with Doxycycline and Cephalexin. We will continue antibiotics for the remaining 3 days and have prescribed an additional 5 days of antibiotics as a precaution, to be used only if symptoms persist. They are encouraged to intake probiotics during and after the antibiotic course.

## 2022-10-17 NOTE — Assessment & Plan Note (Signed)
Advised patient get omron blood pressure cuff and let me know if over 140/90

## 2022-10-17 NOTE — Assessment & Plan Note (Signed)
Brief suicidal ideation due to intractable pain but doing better now.  He has good plan to manage the pain.

## 2022-12-03 ENCOUNTER — Encounter: Payer: Self-pay | Admitting: Internal Medicine

## 2022-12-03 ENCOUNTER — Ambulatory Visit (INDEPENDENT_AMBULATORY_CARE_PROVIDER_SITE_OTHER): Payer: 59 | Admitting: Internal Medicine

## 2022-12-03 VITALS — BP 132/93 | HR 92 | Temp 98.7°F | Ht 67.0 in | Wt 217.2 lb

## 2022-12-03 DIAGNOSIS — Z01810 Encounter for preprocedural cardiovascular examination: Secondary | ICD-10-CM

## 2022-12-03 DIAGNOSIS — M1990 Unspecified osteoarthritis, unspecified site: Secondary | ICD-10-CM

## 2022-12-03 NOTE — Patient Instructions (Signed)
VISIT SUMMARY:  During your visit, we discussed your ongoing back issues and the progressive weakness in your left arm and shoulder. We have decided that surgical intervention, specifically a cervical disc arthroplasty, is necessary due to the rapid progression of your symptoms and the risk of permanent muscle loss and paralysis of your left arm. We also discussed your elevated blood pressure, which we will continue to manage as we have been.  YOUR PLAN:  -DEGENERATIVE DISC DISEASE: This is a condition where the discs in your spine, which act as cushions between the bones, wear down over time. We will proceed with artificial disc replacement surgery to address this issue. Before the surgery, we will perform a preoperative creatinine check. This is a blood test to check your kidney function and ensure safe dosing of drugs during surgery.  -HYPERTENSION: This is a condition where your blood pressure is consistently too high. We discussed this in the context of your upcoming surgery. We will continue to manage your blood pressure as we have been doing.  INSTRUCTIONS:  In preparation for your surgery, you should avoid exposure to infections and stop using NSAIDs. After the surgery, we will schedule a follow-up appointment to assess your recovery and the effectiveness of the procedure.

## 2022-12-03 NOTE — Progress Notes (Signed)
Patient Care Team: Lula Olszewski, MD as PCP - General (Internal Medicine) Corie Chiquito, PMHNP as Nurse Practitioner (Psychiatry) Shon Millet, MD as Consulting Physician (Ophthalmology) Richardean Sale, DO as Consulting Physician (Sports Medicine) Today's Healthcare Provider: Lula Olszewski, MD   Chief Complaint:  Mark Fritz is a 37 y.o. male who presents today for consultation for surgical clearance at the request of Dr. Noel Gerold for artificial disk replacement.  Assessment/Plan:  Encounter For Consultation regarding Perioperative risks and benefits (aka surgical clearance and informed consent)  During our consultation on 12/03/2022, we discussed the risks and benefits of the planned procedure, c5-7 disc arthroplasty.  We explored all available options to ensure the approach aligns with your values and preferences  To facilitate shared decision-making, I used open communication techniques to explain the potential risks and benefits of the procedure.  I respected your autonomy to make informed choices about your care, while adhering to professional standards and ethical principles.    Some of the specific procedural risks and patient specific risks we reviewed were: arachnoiditis, serious infection, blood clot.  Bleeding.  Many other unpredictable. Advised to reschedule if any infection near time of scheduled surgery.  I ensured patient understanding of the risks by asking clarifying questions and summarizing key points throughout the discussion.  We also addressed decision-making capacity to evaluate these risks.  I believe the patient clearly understood everything we discussed, but I also explained I remain open to clarifying any questions.  Despite any recommendations I may provided, the patient was empowered with the ultimate right to choose the course of treatment that best suits you.     In combination with the risk calculators described in the subjective section,  the  procedure is considered intermediate-risk and your I've stratified the patient-specific risk as low risk.  I believe the proposed procedures benefits outweigh the risks and that you fully understand these risks and have capacity to accept them.  This documented discussion will be readily available to you in your MyChart  [email or web address]_type=default&display_rank=1  We usually obtain an electrocardiogram (ECG) for patients with known cardiovascular disease, significant arrhythmia, or significant structural heart disease if they are to undergo an intermediate- to high-risk surgery. If the patient reports no recent or interim cardiac symptoms, we do not repeat an ECG if one has been done in the past 12 months. We do not routinely perform a preoperative ECG in patients who undergo low-risk surgery (<1 percent estimated risk of cardiac death or nonfatal MI) [3,11]. (See 'Intrinsic surgical risk' above.)  Other than a serum creatinine, no routine screening blood tests are indicated specifically for evaluation of cardiac risk.   We do not suggest routinely using BNP or NT-proBNP for preoperative risk stratification as their use has not yet been associated with improved clinical outcomes. However, BNP/NT-proBNP may be helpful in patients for whom the decision to undergo preoperative cardiac testing is unclear.  We use an NT-proBNP cutoff  of 200 pg/mL or a BNP of 92 ng/L to help decide whether or not to pursue stress testing or coronary computed tomography angiography (CCTA) in patients with unclear cardiac risk in whom testing would influence management  The ACC/AHA guidelines do not recommend obtaining a preoperative troponin level, and most clinicians do not routinely order them. However, the 2022 ESC guidelines recommend obtaining preoperative  troponins in patients >31 years old or with cardiac risk factors undergoing intermediate to high-risk surgery.    Subjective:  HPI     DASI calculated  mets The higher the DASI score, the more physically active the patient is. Patients who can achieve <4 METs have poor functional capacity, 4 to 10 METs suggest moderate functional capacity, and >10 METs suggest excellent functional capacity. The DASI questionnaire is not designed to assess very high levels of physical activity. The maximum DASI score is 58.2, which would be the equivalent of 9.89 METs.  Its controversial but many clinicians use a cutoff DASI of less than 25-34 as a marker of increased surgical risk.  Elevated NT proBNP is also associated with increased 30-day and 1 year death.  Curator of Surgeons Surgical Risk (ACS NSQIP) This calculator is best for calculating surgery specific risk - High risk - >5 percent - Intermediate risk - ?1 to ?5 percent - Low risk - <1 percent    MICA Chales Abrahams) perioperative risk  RCRI Calculated Risk   ROS    unless noted otherwise, a complete review of systems was negative.    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   Patient Active Problem List   Diagnosis Date Noted   Elevated blood pressure reading 10/17/2022   DJD (degenerative joint disease) of cervical spine 10/16/2022   Rectal abscess 10/09/2022   Arthritis 08/25/2022   Blindness of right eye 08/10/2022   Rectal bleeding 06/29/2022   Change in bowel habits 06/29/2022   Irregular bowel habits 06/29/2022   Obesity due to energy imbalance 03/30/2022   FH: CAD (coronary artery disease) 03/30/2022   Inguinal hernia, left 09/29/2021   MDD (major depressive disorder), recurrent severe,  without psychosis (HCC) 01/06/2021   Primary osteoarthritis of right hand 01/01/2020   Right wrist pain 12/09/2019   Major depressive disorder, recurrent episode, moderate (HCC) 02/19/2018   Numbness 12/23/2017   Chronic idiopathic constipation 03/07/2017   Fitting or adjustment of artificial eye 10/27/2013   ADHD (attention deficit hyperactivity disorder) 10/23/2013   History of bunionectomy of left great toe 2008   Past Surgical History:  Procedure Laterality Date   BLEPHAROPLASTY Right    BUNIONECTOMY Left 2008   DORSAL COMPARTMENT RELEASE Right 01/14/2018   Procedure: RIGHT WRIST RELEASE DORSAL COMPARTMENT (DEQUERVAIN);  Surgeon: Cindee Salt, MD;  Location: [facility];  Service: Orthopedics;  Laterality: Right;   VASECTOMY      Family History  Problem Relation Age of Onset   Depression Mother    Diabetes Mother    Miscarriages / [place] Mother    Hearing loss Father    Hypertension Father    Hyperlipidemia Father    Obesity Father    Depression Sister    Anxiety disorder Sister    Obesity Sister    Anxiety disorder Sister    Depression Sister    Obesity Sister    Mental illness Maternal Grandmother    Depression Maternal Grandmother    Hypertension Paternal Grandfather    Stroke Paternal Grandfather    Autism Son    Anxiety disorder Son    Tics Son    Anxiety disorder Son     Medications- reviewed and updated Current Outpatient Medications  Medication Sig Dispense Refill   acetaminophen (TYLENOL) 325 MG tablet Take 650 mg by mouth every 6 (  six) hours as needed.     Amphetamine Sulfate (EVEKEO) 10 MG TABS Take 1 tablet (10 mg total) by mouth 2 (two) times daily. 60 tablet 0   Amphetamine Sulfate (EVEKEO) 10 MG TABS Take 1 tablet (10 mg total) by mouth 2 (two) times daily. 60 tablet 0   [START ON 12/24/2022] Amphetamine Sulfate (EVEKEO) 10 MG TABS Take 1 tablet (10 mg total) by mouth 2 (two) times daily. 60 tablet 0   ASHWAGANDHA PO Take by  mouth.     baclofen (LIORESAL) 10 MG tablet Take 1 tablet (10 mg total) by mouth 3 (three) times daily. (Patient taking differently: Take 10 mg by mouth as needed.) 30 each 0   celecoxib (CELEBREX) 200 MG capsule Take 1 capsule (200 mg total) by mouth 2 (two) times daily. 60 capsule 5   doxylamine, Sleep, (UNISOM) 25 MG tablet Take 12.5 mg by mouth at bedtime as needed.     famotidine (PEPCID) 10 MG tablet Take 10 mg by mouth as needed for heartburn or indigestion.     hydrOXYzine (VISTARIL) 25 MG capsule Take 25 mg by mouth at bedtime as needed for anxiety.     Vilazodone HCl 20 MG TABS Take 1 tablet (20 mg total) by mouth daily with breakfast. 90 tablet 1   cetirizine (ZYRTEC) 10 MG tablet Take 10 mg by mouth daily. (Patient not taking: Reported on 12/03/2022)     oxyCODONE-acetaminophen (PERCOCET/ROXICET) 5-325 MG tablet Take 1 tablet by mouth every 6 (six) hours as needed for severe pain. (Patient not taking: Reported on 12/03/2022) 15 tablet 0   No current facility-administered medications for this visit.    Allergies-reviewed and updated Allergies  Allergen Reactions   Bupropion Other (See Comments)    Anger/ Enraged behavior    Social History   Socioeconomic History   Marital status: Married    Spouse name: Not on file   Number of children: 3   Years of education: Not on file   Highest education level: Not on file  Occupational History   Occupation: Clinical research associate  Tobacco Use   Smoking status: Never   Smokeless tobacco: Never  Vaping Use   Vaping status: Never Used  Substance and Sexual Activity   Alcohol use: No   Drug use: No   Sexual activity: Yes  Other Topics Concern   Not on file  Social History Narrative   Not on file   Social Determinants of Health   Financial Resource Strain: Not on file  Food Insecurity: Not on file  Transportation Needs: Not on file  Physical Activity: Not on file  Stress: Not on file  Social Connections: Not on file         Objective:   Physical Exam: BP (!) 132/93 (BP Location: Left Arm, Patient Position: Sitting)   Pulse 92   Temp 98.7 F (37.1 C) (Temporal)   Ht 5\' 7"  (1.702 m)   Wt 217 lb 3.2 oz (98.5 kg)   SpO2 97%   BMI 34.02 kg/m    Physical Exam: full range of motion left arm but weakness moderate in left arm and shoulder This is a polite, friendly, and genuine person. Constitutional: NAD, AAO, not ill-appearing HENT:  NCAT, normal nose, mucous membranes moist Eyes:  nonicteric, no injection CV:  RRR, no mrg Lung: CTAB, normal wob Abd: soft, nondistended, no guarding Skin: warm, dry, no lesions of concern Neuro: alert, no focal deficit obvious, articulate speech Psych: normal mood, behavior, thought content  No results found for this or any previous visit (from the past 24 hour(s)).      Care coordination: A copy of this note will be forwarded to the requesting physician   Attestation:  I have personally spent  33 minutes involved in face-to-face and non-face-to-face activities for this patient on the day of the visit. Professional time spent includes the following activities:  Preparing to see the patient by reviewing medical records prior to and during the encounter; Obtaining, documenting, and reviewing an updated medical history; Performing a medically appropriate examination;  Evaluating, synthesizing, and documenting the available clinical information in the EMR;  Coordinating/Communicating with other health care professionals; Independently interpreting results (not separately reported), Communicating, counseling, educating about results to the patient/family/caregiver (not separately reported); Collaboratively developing and communicating an individualized treatment plan with the patient; Placing medically necessary orders (for medications/tests/procedures/referrals);   This time was independent of any separately billable procedure(s).  The extended duration of this patient visit was medically  necessary due to several factors:  The patient's health condition is multifaceted, requiring a comprehensive evaluation of patient and their past records to ensure accurate diagnosis and treatment planning; Effective patient education and communication, particularly for patients with complex care needs, often require additional time to ensure the patient (or caregivers) fully understand the care plan;  Coordination of care with other healthcare professionals and services depends on thorough documentation, extending both documentation time and visit durations.  All these factors are integral to providing high-quality patient care and ensuring optimal health outcomes.

## 2023-01-09 MED ORDER — AMPHETAMINE SULFATE 10 MG PO TABS: 10.0000 mg | ORAL_TABLET | Freq: Two times a day (BID) | ORAL | 0 refills | Status: DC

## 2023-01-09 MED ORDER — AMPHETAMINE SULFATE 10 MG PO TABS
10.0000 mg | ORAL_TABLET | Freq: Two times a day (BID) | ORAL | 0 refills | Status: DC
Start: 2023-02-06 — End: 2023-04-15

## 2023-02-19 DIAGNOSIS — S65517A Laceration of blood vessel of left little finger, initial encounter: Secondary | ICD-10-CM | POA: Insufficient documentation

## 2023-03-13 ENCOUNTER — Encounter: Payer: Self-pay | Admitting: Psychiatry

## 2023-04-05 ENCOUNTER — Ambulatory Visit (INDEPENDENT_AMBULATORY_CARE_PROVIDER_SITE_OTHER): Payer: 59 | Admitting: Internal Medicine

## 2023-04-05 ENCOUNTER — Encounter: Payer: Self-pay | Admitting: Internal Medicine

## 2023-04-05 VITALS — BP 121/81 | HR 81 | Temp 98.1°F | Ht 67.0 in | Wt 223.0 lb

## 2023-04-05 DIAGNOSIS — F332 Major depressive disorder, recurrent severe without psychotic features: Secondary | ICD-10-CM

## 2023-04-05 DIAGNOSIS — Z0000 Encounter for general adult medical examination without abnormal findings: Secondary | ICD-10-CM

## 2023-04-05 DIAGNOSIS — E785 Hyperlipidemia, unspecified: Secondary | ICD-10-CM

## 2023-04-05 DIAGNOSIS — Z823 Family history of stroke: Secondary | ICD-10-CM

## 2023-04-05 DIAGNOSIS — Z8249 Family history of ischemic heart disease and other diseases of the circulatory system: Secondary | ICD-10-CM | POA: Diagnosis not present

## 2023-04-05 DIAGNOSIS — E669 Obesity, unspecified: Secondary | ICD-10-CM | POA: Diagnosis not present

## 2023-04-05 DIAGNOSIS — Z0001 Encounter for general adult medical examination with abnormal findings: Secondary | ICD-10-CM

## 2023-04-05 DIAGNOSIS — Z970 Presence of artificial eye: Secondary | ICD-10-CM

## 2023-04-05 DIAGNOSIS — J309 Allergic rhinitis, unspecified: Secondary | ICD-10-CM

## 2023-04-05 MED ORDER — SEMAGLUTIDE-WEIGHT MANAGEMENT 0.25 MG/0.5ML ~~LOC~~ SOAJ
0.2500 mg | SUBCUTANEOUS | 0 refills | Status: AC
Start: 2023-04-05 — End: 2023-05-03

## 2023-04-05 MED ORDER — SEMAGLUTIDE-WEIGHT MANAGEMENT 1.7 MG/0.75ML ~~LOC~~ SOAJ
1.7000 mg | SUBCUTANEOUS | 0 refills | Status: AC
Start: 2023-07-01 — End: 2023-07-29

## 2023-04-05 MED ORDER — SEMAGLUTIDE-WEIGHT MANAGEMENT 0.5 MG/0.5ML ~~LOC~~ SOAJ
0.5000 mg | SUBCUTANEOUS | 0 refills | Status: AC
Start: 2023-05-04 — End: 2023-06-01

## 2023-04-05 MED ORDER — SEMAGLUTIDE-WEIGHT MANAGEMENT 2.4 MG/0.75ML ~~LOC~~ SOAJ
2.4000 mg | SUBCUTANEOUS | 0 refills | Status: AC
Start: 2023-07-30 — End: 2023-08-27

## 2023-04-05 MED ORDER — SEMAGLUTIDE-WEIGHT MANAGEMENT 1 MG/0.5ML ~~LOC~~ SOAJ
1.0000 mg | SUBCUTANEOUS | 0 refills | Status: AC
Start: 2023-06-02 — End: 2023-06-30

## 2023-04-05 NOTE — Assessment & Plan Note (Signed)
In addition to standard Comprehensive Physical Exam (CPE) preventive care annual visit guidance on maintaining healthy weight, I spent 10 minutes discussing the state of medical weight loss, how to lose weight in a healthy way, and discussing costs/risks/benefits of medication(s) option and prescribing GLP-1 agonist specifically Wegovy due to his family history coronary art disease and strong data for semaglutide there.  I offered to complete prior authorization to assist in obtaining. I discussed importance of healthy weight loss and not just focusing on losing weight but targeting fat  We had a conversation today about healthy eating habits, exercise, calorie and carb goals for sustainable and successful weight loss. I explained importance of adequateprotein daily intake values and encouraged increasing fiber and water intake while adding healthy fat like avocado. I discussed weight loss medications that could be used with consideration of his specific past medical history and financial limitations.  I described  the 3 things that almost all people who lose weight and keep it off do: 1.  Cut out all sugary beverages and all calorie-containing beverages  2.  Resistance training (cardiac exercise not as important for maintaining muscle mass) 3.  Actually counting calories and carbs and deciding against foods that have high amounts - I.e. Check nutrition labels.    Obesity   His BMI classifies him as obese. We discussed weight loss medications including Wegovy, Zepbound, Mounjaro, and Ozempic, highlighting Wegovy's benefits in preventing cardiovascular events, fatty liver, and diabetes. Despite insurance challenges, we recommended a compounding pharmacy and noted Wegovy could lead to approximately 15% body weight loss in the first year, though lifelong use might be necessary without lifestyle changes. We will start [facility] for weight loss, advise purchasing the medication through [email or web address], and  recommend avoiding sugary drinks.

## 2023-04-05 NOTE — Patient Instructions (Addendum)
[email or web address] can get affordable compounded GLP-1 agonist   VISIT SUMMARY:  During your annual physical, we discussed your recent weight gain, family history of hypertension, ongoing struggles with depression, and issues with your glass eye. We also reviewed your general health maintenance and preventive care needs.  YOUR PLAN:  -OBESITY: Obesity means having an excessive amount of body fat. We discussed starting [facility] for weight loss, which can help reduce body weight by about 15% in the first year. You should purchase the medication through [email or web address] and avoid sugary drinks.  -HYPERTENSION: Hypertension, or high blood pressure, can lead to serious health problems. Given your family history, it's important to monitor your blood pressure regularly. We will also check your lipid panel and A1c to assess your condition further.  -DEPRESSION: Depression is a mood disorder that causes persistent feelings of sadness and loss of interest. You are currently on antidepressants but experiencing difficulties. It's important to continue your current treatment and follow up with your mental health provider for a medication review.  -ALLERGIC RHINITIS: Allergic rhinitis is an allergic reaction that causes sneezing, congestion, and a runny nose. You should use Flonase on days with increased sneezing and Debrox drops for ear wax management if needed.  -GENERAL HEALTH MAINTENANCE: We discussed the importance of preventive care, including eye exams, dental check-ups, and maintaining sinus health. You should continue with a healthy diet, avoid smoking, exercise regularly, and monitor your weight.  INSTRUCTIONS:  Please schedule your next annual physical, return for any new or worsening symptoms, and consider a follow-up visit for weight loss management if insurance covers it.

## 2023-04-05 NOTE — Progress Notes (Unsigned)
[facility]: [phone]   -- Annual Preventive Medical Office Visit --  Patient:  Mark Fritz      Age: 37 y.o.       Sex:  male  Date:   04/05/2023 Patient Care Team: Lula Olszewski, MD as PCP - General (Internal Medicine) Corie Chiquito, PMHNP as Nurse Practitioner (Psychiatry) Shon Millet, MD as Consulting Physician (Ophthalmology) Richardean Sale, DO as Consulting Physician (Sports Medicine) Today's Healthcare Provider: Lula Olszewski, MD  ========================================= Chief Complaint  Patient presents with   Annual Exam    CPE    Purpose of Visit: Comprehensive preventive health assessment and personalized health maintenance planning.  This encounter was conducted as a Comprehensive Physical Exam (CPE) preventive care annual visit. The patient's medical history and problem list were reviewed to inform individualized preventive care recommendations.  No problem-specific medical treatment was provided during this visit.    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  I offered to complete prior authorization to assist in obtaining. I discussed importance of healthy weight loss and not just focusing on losing weight but targeting fat  We had a conversation today about healthy eating habits, exercise, calorie and carb goals for sustainable and successful weight loss. I explained importance of adequateprotein daily intake values and encouraged increasing fiber and water intake while adding healthy fat like avocado. I discussed weight loss medications that could be used with consideration of his specific past medical history and financial  limitations.  I explained to him  the 3 things that almost all people who lose weight and keep it off do: 1.  Cut out all sugary beverages and all calorie-containing beverages  2.  Resistance training (cardiac exercise not as important for maintaining muscle mass) 3.  Actually counting calories and carbs and deciding against foods that have high amounts - I.e. Check nutrition labels.   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  He reports his last eye exam was:  doing them needs to schedule next  Dental health:  He reports he has been keeping up with his dental visits.  He intends to do so in the future.  We encourage regular tooth brushing/flossing. Sinus health:  We encouraged sterile saline nasal misting sinus rinses daily  for pollen, to reduce allergies and risk for sinus infections.   Sterile can based misting products are recommended due to superior misting and ease of maintaining sterility Sleep Apnea screening:  We encourage self monitoring of sleep quality with SnoreLab App and other tools/apps that are now available at retail []  Do you snore loudly []  Tired, fatigued, or sleepy during  the daytime []  Witness apneas []  Significant hypertension []  BMI greater than 35    []  Age older than 39 years old [x]  Has large neck size over 15.7 in [x]  Male Total Score: 2  Cardiovascular Risk Factor Reduction:   Advised patient of need for regular exercise and diet rich and fruits and vegetables and healthy fats to reduce risk of heart attack and stroke.  Avoid first- and second-hand smoke and stimulants.   Avoid extreme exercise- exercise in moderation (150 minutes per week is a good goal) Discussed health benefits of physical activity, and encouraged him to engage in regular exercise appropriate for his age and condition. Exercise Activities and Dietary recommendations  Goals   None    Wt Readings from Last 3 Encounters:  04/05/23 223 lb (101.2 kg)  12/03/22 217 lb 3.2 oz (98.5 kg)  10/17/22 218 lb 6.4 oz (99.1 kg)  Body mass index is 34.93 kg/m. Health maintenance and immunizations reviewed and he was encouraged to complete anything that is due: Immunization History  Administered Date(s) Administered   Influenza,inj,Quad PF,6+ Mos 02/03/2019   Influenza-Unspecified 01/23/2017, 04/01/2020   Moderna Sars-Covid-2 Vaccination 04/08/2020   PFIZER Comirnaty(Gray Top)Covid-19 Tri-Sucrose Vaccine 08/03/2019, 08/25/2019   PFIZER(Purple Top)SARS-COV-2 Vaccination 08/03/2019, 08/25/2019   Tdap 10/01/2018  There are no preventive care reminders to display for this patient.  Health Maintenance  Topic Date Due   DTaP/Tdap/Td (2 - Td or Tdap) 09/30/2028   COVID-19 Vaccine  Completed   HIV Screening  Completed   HPV VACCINES  Aged Out   INFLUENZA VACCINE  Discontinued   Hepatitis C Screening  Discontinued   Lifestyle / Social History Reviewed:  Social History   Tobacco Use   Smoking status: Never   Smokeless tobacco: Never  Vaping Use   Vaping status: Never Used  Substance Use Topics   Alcohol use: No   Drug use: No    My recommendation is total abstinence from all substances  of abuse including smoke and 2nd hand smoke, alcohol, illicit drugs, smoking, inhalants, sugar, high risk sexual behavior Offered to assist with any use disorders or addictions.   Sexual transmitted infection testing offered today, but patient declined as he feels he is low risk based on his sexual history.   Social History   Substance and Sexual Activity  Sexual Activity Yes      04/05/2023    8:20 AM  Depression screen PHQ 2/9  Decreased Interest 1  Down, Depressed, Hopeless 1  PHQ - 2 Score 2  Altered sleeping 1  Tired, decreased energy 1  Change in appetite 2  Feeling bad or failure about yourself  1  Trouble concentrating 1  Moving slowly or fidgety/restless 1  Suicidal thoughts 0  PHQ-9 Score 9  Difficult doing work/chores Somewhat difficult  Injury prevention: Discussed not texting and driving. Today's Cancer Screening  Penile/Testicle/Scrotum cancer screening: Asked the patient to self-monitor for genital tumors. Patient reports there are none. Thyroid cancer screening: patient advised to check by palpating thyroid for nodules Prostate cancer screening:  Denies family history of prostate cancer or hematospermia so too young for screening by  current guidelines.(No results found for: "PSA") Colon cancer screening:    Denies strong family history of colon cancer or blood in stool so no screening is indicated until age 82.   Lung cancer screening:  Current guidelines recommend Individuals aged 67 to 75 who currently smoke or formerly smoked and have a >= 20 pack-year smoking history should undergo annual screening with low-dose computed tomography (LDCT). Skin cancer screening-  Advised regular sunscreen use. He denies worrisome, changing, or new skin lesions. Showed him pictures of melanomas for reference:  Return to care in 1 year for next preventative visit.      Subjective  37 y.o. male presents today for a complete physical exam.  He reports consuming a general diet.  running He generally feels well. He reports sleeping well. He does not have additional problems to discuss today.  HPI HPI     Annual Exam    Additional comments: CPE       Last edited by Jobe Gibbon, CMA on 04/05/2023  8:02 AM.      AI-Extracted: Discussed the use of AI scribe software for clinical note transcription with the patient, who gave verbal consent to proceed.  History of Present Illness          Review of Systems  Constitutional:  Negative for chills, diaphoresis, fever, malaise/fatigue and weight loss.  HENT:  Negative for congestion, ear discharge, ear pain, hearing loss, nosebleeds, sinus pain, sore throat and tinnitus.   Eyes:  Negative for blurred vision, double vision, photophobia, pain, discharge and redness.  Respiratory:  Negative for cough, hemoptysis, sputum production, shortness of breath, wheezing and stridor.   Cardiovascular:  Negative for chest pain, palpitations, orthopnea, claudication, leg swelling and PND.  Gastrointestinal:  Negative for abdominal pain, blood in stool, constipation, diarrhea, heartburn, melena, nausea and vomiting.  Genitourinary:  Negative for dysuria, flank pain, frequency, hematuria and urgency.  Musculoskeletal:  Negative for back pain, falls, joint pain, myalgias and neck pain.  Skin:  Negative for itching and rash.  Neurological:  Negative for dizziness, tingling, tremors, sensory change, speech change, focal weakness, seizures, loss of consciousness, weakness and headaches.  Endo/Heme/Allergies:  Negative for environmental allergies and polydipsia. Does not bruise/bleed easily.  Psychiatric/Behavioral:  Negative for depression, hallucinations, memory loss, substance abuse and suicidal ideas. The patient is not nervous/anxious and does not have insomnia.    Disclaimer about ROS at Annual Preventive Visits Patients are informed before the Review of Systems (ROS) that identifying significant medical issues during the  wellness visit may require immediate attention, potentially resulting in a separate billable encounter beyond the scope of the preventive exam. This disclosure is mandated by professional ethics and legal obligations, as healthcare providers must address any substantial health concerns raised during any patient interaction.  A comprehensive ROS is required by insurance companies for billing the visit. However, this structure may inadvertently discourage patients from fully disclosing health concerns due to potential financial implications. Consequently, patients often emphasize that any positive ROS findings are related to stable chronic conditions, requesting that these not be discussed during the preventive visit to avoid additional charges. Patients may also ask that reported complaints not be listed in the ROS to prevent affecting billing.  Problem list overviews that were updated at today's visit: Problem  Glass Eye   I attest that I have reviewed and confirmed the patients current medications to meet the medication reconciliation requirement *** Current Outpatient Medications on File Prior to Visit  Medication Sig  acetaminophen (TYLENOL) 325 MG tablet Take 650 mg by mouth every 6 (six) hours as needed.   Amphetamine Sulfate (EVEKEO) 10 MG TABS Take 1 tablet (10 mg total) by mouth 2 (two) times daily.   Amphetamine Sulfate (EVEKEO) 10 MG TABS Take 1 tablet (10 mg total) by mouth 2 (two) times daily.   Amphetamine Sulfate (EVEKEO) 10 MG TABS Take 1 tablet (10 mg total) by mouth 2 (two) times daily.   ASHWAGANDHA PO Take by mouth.   baclofen (LIORESAL) 10 MG tablet Take 1 tablet (10 mg total) by mouth 3 (three) times daily. (Patient taking differently: Take 10 mg by mouth as needed.)   doxylamine, Sleep, (UNISOM) 25 MG tablet Take 12.5 mg by mouth at bedtime as needed.   famotidine (PEPCID) 20 MG tablet Take by mouth.   hydrOXYzine (VISTARIL) 25 MG capsule Take 25 mg by mouth at bedtime as  needed for anxiety.   oxyCODONE-acetaminophen (PERCOCET/ROXICET) 5-325 MG tablet Take 1 tablet by mouth every 6 (six) hours as needed for severe pain.   propranolol (INDERAL) 10 MG tablet Take by mouth.   Vilazodone HCl 20 MG TABS Take 1 tablet (20 mg total) by mouth daily with breakfast.   No current facility-administered medications on file prior to visit.   Medications Discontinued During This Encounter  Medication Reason   cetirizine (ZYRTEC) 10 MG tablet    famotidine (PEPCID) 10 MG tablet    celecoxib (CELEBREX) 200 MG capsule    Outpatient Medications Prior to Visit  Medication Sig   acetaminophen (TYLENOL) 325 MG tablet Take 650 mg by mouth every 6 (six) hours as needed.   Amphetamine Sulfate (EVEKEO) 10 MG TABS Take 1 tablet (10 mg total) by mouth 2 (two) times daily.   Amphetamine Sulfate (EVEKEO) 10 MG TABS Take 1 tablet (10 mg total) by mouth 2 (two) times daily.   Amphetamine Sulfate (EVEKEO) 10 MG TABS Take 1 tablet (10 mg total) by mouth 2 (two) times daily.   ASHWAGANDHA PO Take by mouth.   baclofen (LIORESAL) 10 MG tablet Take 1 tablet (10 mg total) by mouth 3 (three) times daily. (Patient taking differently: Take 10 mg by mouth as needed.)   doxylamine, Sleep, (UNISOM) 25 MG tablet Take 12.5 mg by mouth at bedtime as needed.   famotidine (PEPCID) 20 MG tablet Take by mouth.   hydrOXYzine (VISTARIL) 25 MG capsule Take 25 mg by mouth at bedtime as needed for anxiety.   oxyCODONE-acetaminophen (PERCOCET/ROXICET) 5-325 MG tablet Take 1 tablet by mouth every 6 (six) hours as needed for severe pain.   propranolol (INDERAL) 10 MG tablet Take by mouth.   Vilazodone HCl 20 MG TABS Take 1 tablet (20 mg total) by mouth daily with breakfast.   [DISCONTINUED] celecoxib (CELEBREX) 200 MG capsule Take 1 capsule (200 mg total) by mouth 2 (two) times daily.   [DISCONTINUED] cetirizine (ZYRTEC) 10 MG tablet Take 10 mg by mouth daily. (Patient not taking: Reported on 12/03/2022)    [DISCONTINUED] famotidine (PEPCID) 10 MG tablet Take 10 mg by mouth as needed for heartburn or indigestion.   No facility-administered medications prior to visit.  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   Past Surgical History:  Procedure Laterality Date   BLEPHAROPLASTY Right    BUNIONECTOMY Left 2008   DORSAL COMPARTMENT RELEASE Right 01/14/2018   Procedure: RIGHT WRIST RELEASE DORSAL COMPARTMENT (DEQUERVAIN);  Surgeon: Cindee Salt, MD;  Location: [facility];  Service: Orthopedics;  Laterality: Right;   VASECTOMY     Social History   Socioeconomic History   Marital status: Married    Spouse name: Not on file   Number of children: 3   Years of education: Not on file   Highest education level: Not on file  Occupational History   Occupation: Clinical research associate  Tobacco Use   Smoking status: Never   Smokeless tobacco: Never  Vaping Use   Vaping status: Never Used  Substance and Sexual Activity   Alcohol use: No   Drug use: No   Sexual activity: Yes  Other Topics Concern   Not on file  Social History Narrative   Not on file   Social Determinants of Health   Financial Resource Strain: Not on file  Food Insecurity: Not on file  Transportation Needs: Not on file  Physical Activity: Not on file  Stress: Not on file  Social Connections: Not on file  Intimate Partner Violence: Not on file   Family Status  Relation Name Status   Mother  Alive   Father  Alive   Sister  Alive   Sister  Alive   Brother  Alive   MGM  Alive   MGF  Deceased   PGM  Deceased   PGF  Deceased   Son  Alive   Son  Alive   Daughter  Alive  No partnership data on  file   Family History  Problem Relation Age of Onset   Depression Mother    Diabetes Mother    Miscarriages / [place] Mother    Hearing loss Father    Hypertension Father    Hyperlipidemia Father    Obesity Father    Depression Sister    Anxiety disorder Sister    Obesity Sister    Anxiety disorder Sister    Depression Sister    Obesity Sister    Mental illness Maternal Grandmother    Depression Maternal Grandmother    Hypertension Paternal Grandfather    Stroke Paternal Grandfather    Autism Son    Anxiety disorder Son    Tics Son    Anxiety disorder Son    Allergies  Allergen Reactions   Bupropion Other (See Comments)    Anger/ Enraged behavior   Patient Care Team: Lula Olszewski, MD as PCP - General (Internal Medicine) Corie Chiquito, PMHNP as Nurse Practitioner (Psychiatry) Shon Millet, MD as Consulting Physician (Ophthalmology) Richardean Sale, DO as Consulting Physician (Sports Medicine)      Objective  BP 121/81   Pulse 81   Temp 98.1 F (36.7 C) (Temporal)   Ht 5\' 7"  (1.702 m)   Wt 223 lb (101.2 kg)   SpO2 96%   BMI 34.93 kg/m  {Insert last BP/Wt (optional):23777}{See vitals history (optional):1}  Physical Exam  Wt Readings from Last 3 Encounters:  04/05/23 223 lb (101.2 kg)  12/03/22 217 lb 3.2 oz (98.5 kg)  10/17/22 218 lb 6.4 oz (99.1 kg)  Physical Exam         GEN: NAD, Resting Comfortably. HEENT: Tympanic membranes normal appearing bilaterally, Oropharynx clear, No Thyromegaly noted. No palpable lymphadenopathy or thyroid nodules.  CARDIOVASCULAR: S1 and S2 heart sounds have regular rate and rhythm with no murmurs appreciated. PULMONARY:  Normal work of breathing. Clear to auscultation bilaterally with no crackles, wheezes, or rhonchi. ABDOMEN: Soft, Nontender, Nondistended.  MSK: No edema, cyanosis, or clubbing noted. SKIN: Warm, dry, no lesions of concern observed. NEURO: CN2-12 grossly intact. Strength 5/5 in upper and lower  extremities. Reflexes symmetric and intact bilaterally.  PSYCH: Normal affect and thought content, pleasant and cooperative. Last depression screening scores    04/05/2023    8:20 AM 10/09/2022    8:16 AM 03/30/2022    8:21 AM  PHQ 2/9 Scores  PHQ - 2 Score 2 0 0  PHQ- 9 Score 9     Last fall risk screening    04/05/2023    8:19 AM  Fall Risk   Falls in the past year? 0  Number falls in past yr: 0  Injury with Fall? 0  Risk for fall due to : No Fall Risks  Follow up Falls evaluation completed   Last Audit-C alcohol use screening     No data to display         A score of 3 or more in women, and 4 or more in men indicates increased risk for alcohol abuse, EXCEPT if all of the points are from question 1  {Insert previous labs (optional):23779} {See past labs  Heme  Chem  Endocrine  Serology  Results Review (optional):1}   Lula Olszewski, MD  [facility], [facility] [phone] (phone) [phone] (fax) [facility]

## 2023-04-07 DIAGNOSIS — E785 Hyperlipidemia, unspecified: Secondary | ICD-10-CM | POA: Insufficient documentation

## 2023-04-07 NOTE — Assessment & Plan Note (Signed)
He was also interesting in prior authorization for [facility] due to family history coronary artery disease, for which [facility] just got FDA preventive indication

## 2023-04-07 NOTE — Assessment & Plan Note (Signed)
Depression   He is currently on antidepressants but has reported recent difficulties. He plans to meet with his mental health provider for potential medication adjustments. We emphasized the importance of continuing his current treatment and following up with his mental health provider for a medication review.

## 2023-04-15 ENCOUNTER — Telehealth (INDEPENDENT_AMBULATORY_CARE_PROVIDER_SITE_OTHER): Payer: 59 | Admitting: Psychiatry

## 2023-04-15 ENCOUNTER — Encounter: Payer: Self-pay | Admitting: Psychiatry

## 2023-04-15 DIAGNOSIS — F3342 Major depressive disorder, recurrent, in full remission: Secondary | ICD-10-CM

## 2023-04-15 DIAGNOSIS — F411 Generalized anxiety disorder: Secondary | ICD-10-CM | POA: Diagnosis not present

## 2023-04-15 DIAGNOSIS — F902 Attention-deficit hyperactivity disorder, combined type: Secondary | ICD-10-CM | POA: Diagnosis not present

## 2023-04-15 MED ORDER — BUSPIRONE HCL 15 MG PO TABS
ORAL_TABLET | ORAL | 2 refills | Status: DC
Start: 2023-04-15 — End: 2023-05-09

## 2023-04-15 MED ORDER — AMPHETAMINE SULFATE 10 MG PO TABS: 10.0000 mg | ORAL_TABLET | Freq: Two times a day (BID) | ORAL | 0 refills | Status: DC

## 2023-04-15 MED ORDER — VILAZODONE HCL 20 MG PO TABS: 20.0000 mg | ORAL_TABLET | Freq: Every day | ORAL | 1 refills | Status: DC

## 2023-04-15 NOTE — Progress Notes (Signed)
[name] [id number] 1986-04-17 37 y.o.  Virtual Visit via Video Note  I connected with pt @ on 04/15/23 at  8:30 AM EST by a video enabled telemedicine application and verified that I am speaking with the correct person using two identifiers.   I discussed the limitations of evaluation and management by telemedicine and the availability of in person appointments. The patient expressed understanding and agreed to proceed.  I discussed the assessment and treatment plan with the patient. The patient was provided an opportunity to ask questions and all were answered. The patient agreed with the plan and demonstrated an understanding of the instructions.   The patient was advised to call back or seek an in-person evaluation if the symptoms worsen or if the condition fails to improve as anticipated.  I provided 35 minutes of non-face-to-face time during this encounter.  The patient was located at home.  The provider was located at [facility].   Corie Chiquito, PMHNP   Subjective:   Patient ID:  Mark Fritz is a 37 y.o. (DOB May 09, 1985) male.  Chief Complaint:  Chief Complaint  Patient presents with   Anxiety   Follow-up    Depression and ADHD    HPI [place] presents for follow-up of ADHD, Anxiety, and depression. He had neck surgery and the surgery went well and he is regaining strength and numbness is improving. He reports that recovery went well. He reports, "I've still had bouts" of depression at times. He reports that he has had some increased anxiety in response to some stressors. Had some changes at work that caused some stress and now is adjusting to these changes. Denies panic. He reports some worry and rumination. He reports that his sleep has been "rough." Sleep is interrupted due to pain and discomfort. Reports taking some thing to help with sleep about 3 nights a week. Appetite has been ok. He reports that he has not been over-eating as much. Energy and  motivation have been ok. He reports that concentration has been challenging at times. He reports that he had a period of passive death wishes. Denies SI.   Took pain medications for a few days after surgery.   He has 3 children at 3 different schools.  Evekeo was last filled 03/25/23.   Past Psychiatric Medication Trials: Sertraline-Effective. Multiple adverse effects (initial GI side effects, insomnia, cognitive side effects, sexual side effects) Trintellix- Not effective and caused some "fuzzy head" Viibryd- Some sexual side effects starting at 40 mg Wellbutrin- irritability Adderall- decreased appetite, increased irritability Evekeo- effective for attention deficit with out significant tolerability issues Lithium-helpful, tremor. Hydroxyzine- causing excessive drowsiness Propranolol- Helpful for anxiety. Noticed mood was lower after taking it.  Gabapentin- Adverse effects (tired, excessive sedation, difficulty with concentration, emotional changes)  Review of Systems:  Review of Systems  Musculoskeletal:  Negative for gait problem.  Neurological:        Numbness  Psychiatric/Behavioral:         Please refer to HPI    Medications: I have reviewed the patient's current medications.  Current Outpatient Medications  Medication Sig Dispense Refill   baclofen (LIORESAL) 10 MG tablet Take 1 tablet (10 mg total) by mouth 3 (three) times daily. (Patient taking differently: Take 10 mg by mouth as needed.) 30 each 0   busPIRone (BUSPAR) 15 MG tablet Take 1/3 tablet p.o. twice daily for 1 week, then take 2/3 tablet p.o. twice daily for 1 week, then take 1 tablet p.o. twice  daily 60 tablet 2   acetaminophen (TYLENOL) 325 MG tablet Take 650 mg by mouth every 6 (six) hours as needed.     [START ON 04/22/2023] Amphetamine Sulfate (EVEKEO) 10 MG TABS Take 1 tablet (10 mg total) by mouth 2 (two) times daily. 60 tablet 0   [START ON 05/20/2023] Amphetamine Sulfate (EVEKEO) 10 MG TABS Take 1  tablet (10 mg total) by mouth 2 (two) times daily. 60 tablet 0   [START ON 06/17/2023] Amphetamine Sulfate (EVEKEO) 10 MG TABS Take 1 tablet (10 mg total) by mouth 2 (two) times daily. 60 tablet 0   ASHWAGANDHA PO Take by mouth. (Patient not taking: Reported on 04/15/2023)     doxylamine, Sleep, (UNISOM) 25 MG tablet Take 12.5 mg by mouth at bedtime as needed.     hydrOXYzine (VISTARIL) 25 MG capsule Take 25 mg by mouth at bedtime as needed for anxiety.     propranolol (INDERAL) 10 MG tablet Take by mouth. (Patient not taking: Reported on 04/15/2023)     Semaglutide-Weight Management 0.25 MG/0.5ML SOAJ Inject 0.25 mg into the skin once a week for 28 days. (Patient not taking: Reported on 04/15/2023) 2 mL 0   [START ON 05/04/2023] Semaglutide-Weight Management 0.5 MG/0.5ML SOAJ Inject 0.5 mg into the skin once a week for 28 days. (Patient not taking: Reported on 04/15/2023) 2 mL 0   [START ON 06/02/2023] Semaglutide-Weight Management 1 MG/0.5ML SOAJ Inject 1 mg into the skin once a week for 28 days. (Patient not taking: Reported on 04/15/2023) 2 mL 0   [START ON 07/01/2023] Semaglutide-Weight Management 1.7 MG/0.75ML SOAJ Inject 1.7 mg into the skin once a week for 28 days. (Patient not taking: Reported on 04/15/2023) 3 mL 0   [START ON 07/30/2023] Semaglutide-Weight Management 2.4 MG/0.75ML SOAJ Inject 2.4 mg into the skin once a week for 28 days. (Patient not taking: Reported on 04/15/2023) 3 mL 0   Vilazodone HCl 20 MG TABS Take 1 tablet (20 mg total) by mouth daily with breakfast. 90 tablet 1   No current facility-administered medications for this visit.    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    Family History  Problem Relation Age of Onset   Depression Mother    Diabetes Mother    Miscarriages / [place] Mother    Hearing loss Father    Hypertension Father    Hyperlipidemia Father    Obesity Father    Depression Sister    Anxiety disorder Sister    Obesity Sister    Anxiety disorder Sister    Depression Sister    Obesity Sister    Mental illness Maternal Grandmother    Depression Maternal Grandmother    Hypertension Paternal Grandfather    Stroke Paternal Grandfather    Autism Son    Anxiety disorder Son    Tics Son    Anxiety disorder Son     Social History   Socioeconomic History   Marital status: Married    Spouse name: Not on file   Number of  children: 3   Years of education: Not on file   Highest education level: Not on file  Occupational History   Occupation: Lawyer  Tobacco Use   Smoking status: Never   Smokeless tobacco: Never  Vaping Use   Vaping status: Never Used  Substance and Sexual Activity   Alcohol use: No   Drug use: No   Sexual activity: Yes  Other Topics Concern   Not on file  Social History Narrative   Not on file   Social Drivers of Health   Financial Resource Strain: Not on file  Food Insecurity: Not on file  Transportation Needs: Not on file  Physical Activity: Not on file  Stress: Not on file  Social Connections: Not on file  Intimate Partner Violence: Not on file    Past Medical History, Surgical history, Social history, and Family history were reviewed and updated as appropriate.   Please see review of systems for further details on the patient's review from today.   Objective:   Physical Exam:  There were no vitals taken for this visit.  Physical Exam Vitals reviewed.  Neurological:     Mental Status: He is alert and oriented to person, place, and time.     Cranial Nerves: No dysarthria.  Psychiatric:        Attention and  Perception: Attention and perception normal.        Mood and Affect: Mood is anxious. Mood is not depressed.        Speech: Speech normal.        Behavior: Behavior is cooperative.        Thought Content: Thought content normal. Thought content is not paranoid or delusional. Thought content does not include homicidal or suicidal ideation. Thought content does not include homicidal or suicidal plan.        Cognition and Memory: Cognition and memory normal.        Judgment: Judgment normal.     Comments: Insight intact     Lab Review:     Component Value Date/Time   NA 138 03/30/2022 0935   K 4.3 03/30/2022 0935   CL 100 03/30/2022 0935   CO2 29 03/30/2022 0935   GLUCOSE 92 03/30/2022 0935   BUN 17 03/30/2022 0935   CREATININE 1.14 03/30/2022 0935   CALCIUM 10.2 03/30/2022 0935   PROT 7.9 03/30/2022 0935   ALBUMIN 5.1 03/30/2022 0935   AST 26 03/30/2022 0935   ALT 43 03/30/2022 0935   ALKPHOS 60 03/30/2022 0935   BILITOT 0.7 03/30/2022 0935   GFRNONAA >60 09/24/2021 0819       Component Value Date/Time   WBC 7.7 03/30/2022 0935   RBC 5.49 03/30/2022 0935   HGB 15.7 03/30/2022 0935   HCT 46.0 03/30/2022 0935   PLT 269.0 03/30/2022 0935   MCV 83.7 03/30/2022 0935   MCH 27.5 09/24/2021 0819   MCHC 34.1 03/30/2022 0935   RDW 13.4 03/30/2022 0935   LYMPHSABS 2.5 03/30/2022 0935   MONOABS 0.5 03/30/2022 0935   EOSABS 0.1 03/30/2022 0935   BASOSABS 0.0 03/30/2022 0935    No results found for: "POCLITH", "LITHIUM"   No results found for: "PHENYTOIN", "PHENOBARB", "VALPROATE", "CBMZ"   .res Assessment: Plan:    38 minutes spent dedicated to the care of this patient on the date of this encounter to include pre-visit review of records, ordering of medication, post visit documentation, and face-to-face time with the patient discussing treatment options for anxiety since he reports that  his anxiety is not fully controlled and anxiety is his primary concern. Discussed potential  benefits, risks, and side effects of BuSpar.  Patient agrees to trial of BuSpar.  Will start BuSpar 15 mg 1/3 tablet twice daily for 1 week, then increase to 2/3 tablet twice daily for 1 week, then increase to 1 tablet twice daily for anxiety. Discussed continuing Viibryd 20 mg daily during Buspar titration, and considering reduction and possible discontinuation of Viibryd in the future if Buspar is effective for his anxiety.  Continue Evekeo 10 mg po BID for ADHD.  Pt to follow-up in 2-3 months or sooner if clinically indicated.  Patient advised to contact office with any questions, adverse effects, or acute worsening in signs and symptoms.    Dharius was seen today for anxiety and follow-up.  Diagnoses and all orders for this visit:  Generalized anxiety disorder -     busPIRone (BUSPAR) 15 MG tablet; Take 1/3 tablet p.o. twice daily for 1 week, then take 2/3 tablet p.o. twice daily for 1 week, then take 1 tablet p.o. twice daily -     Vilazodone HCl 20 MG TABS; Take 1 tablet (20 mg total) by mouth daily with breakfast.  Recurrent major depressive disorder, in full remission (HCC) -     Vilazodone HCl 20 MG TABS; Take 1 tablet (20 mg total) by mouth daily with breakfast.  Attention deficit hyperactivity disorder (ADHD), combined type -     Amphetamine Sulfate (EVEKEO) 10 MG TABS; Take 1 tablet (10 mg total) by mouth 2 (two) times daily. -     Amphetamine Sulfate (EVEKEO) 10 MG TABS; Take 1 tablet (10 mg total) by mouth 2 (two) times daily. -     Amphetamine Sulfate (EVEKEO) 10 MG TABS; Take 1 tablet (10 mg total) by mouth 2 (two) times daily.     Please see After Visit Summary for patient specific instructions.  No future appointments.  No orders of the defined types were placed in this encounter.     -------------------------------

## 2023-04-30 ENCOUNTER — Other Ambulatory Visit (HOSPITAL_COMMUNITY): Payer: Self-pay

## 2023-04-30 ENCOUNTER — Telehealth: Payer: Self-pay | Admitting: Pharmacy Technician

## 2023-04-30 NOTE — Telephone Encounter (Signed)
 Pharmacy Patient Advocate Encounter   Received notification from CoverMyMeds that prior authorization for Wegovy  0.25MG /0.5ML auto-injectors is required/requested.   Insurance verification completed.   The patient is insured through [facility] .   Per test claim: PA required; PA submitted to above mentioned insurance via CoverMyMeds Key/confirmation #[id number] Status is pending

## 2023-05-02 ENCOUNTER — Other Ambulatory Visit (HOSPITAL_COMMUNITY): Payer: Self-pay

## 2023-05-02 NOTE — Telephone Encounter (Signed)
 Pharmacy Patient Advocate Encounter  Received notification from [facility] that Prior Authorization for Wegovy   has been APPROVED from 04/30/2023 to 11/28/2023. Ran test claim, Copay is $24.99. This test claim was processed through [facility]- copay amounts may vary at other pharmacies due to pharmacy/plan contracts, or as the patient moves through the different stages of their insurance plan.   PA #/Case ID/Reference #: [id number]

## 2023-05-09 ENCOUNTER — Other Ambulatory Visit: Payer: Self-pay | Admitting: Psychiatry

## 2023-05-09 DIAGNOSIS — F411 Generalized anxiety disorder: Secondary | ICD-10-CM

## 2023-07-01 ENCOUNTER — Encounter: Payer: Self-pay | Admitting: Internal Medicine

## 2023-07-01 NOTE — Telephone Encounter (Signed)
 MyChart secure digital messaging clinical encounter  Chief Complaint:  Patient reports recurrence of rectal abscess with firmness and beginning pain, requesting antibiotics without in-person evaluation. States "The abscess is back, and it's firm like last time. Just starting to be painful, so I'm hoping to hit it with antibiotics again."  Relevant History: - Patient with history of rectal abscess in August 2024, treated with Doxycycline and Cephalexin with reported improvement - Has scheduled appointment in 2 days (Wednesday afternoon) for evaluation - Medical history includes chronic idiopathic constipation, which may contribute to recurrent anorectal issues - Other relevant comorbidities: obesity due to energy imbalance, history of rectal bleeding, change in bowel habits, irregular bowel habits - No image of current abscess provided - Previous abscess responded to antibiotic therapy  Assessment: - 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 Patient advised to maintain scheduled appointment in 2 days for proper evaluation 2. Declined request for empiric antibiotics without examination    - Referenced current guidelines: American Society of Colon and Rectal Surgeons Practice Parameters for the Management of Anorectal Abscess and Fistula-in-Ano (https://fascrs.org/ascrs/media/files/downloads/Clinical%20Practice%20Guidelines/abscess_fistula.pdf)    - Guidelines recommend examination to determine need for  drainage vs. antibiotics alone 3. Interim management recommendations provided:    - Warm sitz baths 3-4 times daily    - Over-the-counter pain management    - Warm compresses 4. Emergency precautions reviewed with patient 5. Will assess need for:    - Drainage procedure    - Culture-directed antibiotics    - Evaluation for underlying contributing factors    - Preventive measures at upcoming visit  Patient Education: - Explained rationale for in-person evaluation before antibiotic prescription - Provided interim self-care measures for symptom management - Reviewed emergency warning signs - Discussed importance of evaluation for recurrent abscesses to identify potential underlying causes  Medical Decision Making: Moderate complexity due to: - Recurrent nature of condition requiring consideration of multiple possible etiologies - Need to balance patient request with standard of care requiring examination - Risk of inappropriate antibiotic use without proper evaluation - Consideration of patient's underlying chronic conditions that may contribute to recurrence - Medical necessity to defer treatment decisions until proper clinical examination can be performed  Please see the MyChart message reply(ies) for my assessment and plan.    This patient gave consent for this Medical Advice Message and is aware that it may result in a bill to [employer], as well as the possibility of receiving a bill for a co-payment or deductible. They are an established patient, but are not seeking medical advice exclusively about a problem treated during an in person or video visit in the last seven days. I did not recommend an in person or video visit within seven days of my reply.    I spent a total of 15 minutes cumulative time within 7 days through [employer].  I recommend appointment within 7 days. Lula Olszewski, MD  Problem List Updates: - Continue active problem: Rectal abscess  (K61.1)

## 2023-07-03 ENCOUNTER — Ambulatory Visit: Admitting: Internal Medicine

## 2023-07-03 ENCOUNTER — Encounter: Payer: Self-pay | Admitting: Internal Medicine

## 2023-07-03 VITALS — BP 124/84 | HR 89 | Temp 97.8°F | Ht 67.0 in | Wt 218.8 lb

## 2023-07-03 DIAGNOSIS — L0231 Cutaneous abscess of buttock: Secondary | ICD-10-CM | POA: Diagnosis not present

## 2023-07-03 DIAGNOSIS — K645 Perianal venous thrombosis: Secondary | ICD-10-CM

## 2023-07-03 MED ORDER — HYDROCORTISONE ACETATE 25 MG RE SUPP: 25.0000 mg | Freq: Two times a day (BID) | RECTAL | 0 refills | Status: DC

## 2023-07-03 MED ORDER — HYDROCORTISONE ACETATE 25 MG RE SUPP
25.0000 mg | Freq: Two times a day (BID) | RECTAL | Status: DC
Start: 2023-07-03 — End: 2023-07-03

## 2023-07-03 MED ORDER — CEPHALEXIN 500 MG PO CAPS: 500.0000 mg | ORAL_CAPSULE | Freq: Three times a day (TID) | ORAL | 0 refills | Status: DC

## 2023-07-03 NOTE — Progress Notes (Unsigned)
 ==============================  [facility]: [phone]   -- Medical Office Visit --  Patient: Mark Fritz      Age: 38 y.o.       Sex:  male  Date:   07/03/2023 Today's Healthcare Provider: Lula Olszewski, MD  ==============================   CHIEF COMPLAINT: Abscess has returned (In perineum area.)  SUBJECTIVE: Background This is a 38 y.o. male who has Chronic idiopathic constipation; ADHD (attention deficit hyperactivity disorder); Fitting or adjustment of artificial eye; Major depressive disorder, recurrent episode, moderate (HCC); MDD (major depressive disorder), recurrent severe, without psychosis (HCC); Inguinal hernia, left; History of bunionectomy of left great toe; Obesity due to energy imbalance; FH: CAD (coronary artery disease); Rectal bleeding; Change in bowel habits; Irregular bowel habits; Blindness of right eye; Arthritis; Rectal abscess; Right wrist pain; Primary osteoarthritis of right hand; Numbness; DJD (degenerative joint disease) of cervical spine; Elevated blood pressure reading; Glass eye; Laceration of blood vessel of left little finger; and Hyperlipidemia on their problem list.  History of Present Illness Mark Fritz is a 38 year old male who presents with a recurrent infection on the medial aspect of the right gluteus.  He noticed the infection approximately two days ago. It is firm and tender to touch but does not cause pain while sitting. No fever or other systemic symptoms are present, and there is no abnormal redness or swelling. He recalls a previous similar infection in the same location that was treated with antibiotics. He is concerned about the recurrence and speculates it might be related to the use of a sex toy, suggesting a possible reintroduction of bacteria rather than a persistent infection.  He is not currently taking ashwagandha, propranolol, or Wegovy. A recent change in his health insurance benefits  affected the coverage for [facility]. Despite this, he notes a recent weight loss and is managing without the medication.  He acknowledges ongoing depression, which he describes as 'kind of always there'. No significant changes in his heart rate, which was previously noted to be elevated at 115 bpm, are reported. He denies any recent falls but mentions a possible ankle sprain from running.    Visually reviewed that patient  has a past medical history of ADHD (attention deficit hyperactivity disorder), Anxiety, De Quervain's tenosynovitis, right (12/2017), Dental crowns present, Depression, History of gastric ulcer, and Prosthetic eye globe. Manually updated: No problems updated. Verbally reviewed (per Abridge-generated extraction):  Past Medical History - Recurrent skin infection - Depression - Weight management issues Medications - IRJJOA - Not Ashwagandha - Not Propranolol  Visually reviewed and manually updated: Current Outpatient Medications on File Prior to Visit  Medication Sig   acetaminophen (TYLENOL) 325 MG tablet Take 650 mg by mouth every 6 (six) hours as needed.   Amphetamine Sulfate (EVEKEO) 10 MG TABS Take 1 tablet (10 mg total) by mouth 2 (two) times daily.   Amphetamine Sulfate (EVEKEO) 10 MG TABS Take 1 tablet (10 mg total) by mouth 2 (two) times daily.   Amphetamine Sulfate (EVEKEO) 10 MG TABS Take 1 tablet (10 mg total) by mouth 2 (two) times daily.   baclofen (LIORESAL) 10 MG tablet Take 1 tablet (10 mg total) by mouth 3 (three) times daily. (Patient taking differently: Take 10 mg by mouth as needed.)   busPIRone (BUSPAR) 15 MG tablet TAKE 1 TABLET TWICE DAILY   doxylamine, Sleep, (UNISOM) 25 MG tablet Take 12.5 mg by mouth at bedtime as needed.   hydrOXYzine (VISTARIL) 25  MG capsule Take 25 mg by mouth at bedtime as needed for anxiety.   Vilazodone HCl 20 MG TABS Take 1 tablet (20 mg total) by mouth daily with breakfast.   ASHWAGANDHA PO Take by mouth. (Patient not taking:  Reported on 07/03/2023)   propranolol (INDERAL) 10 MG tablet Take by mouth. (Patient not taking: Reported on 07/03/2023)   Semaglutide-Weight Management 1.7 MG/0.75ML SOAJ Inject 1.7 mg into the skin once a week for 28 days. (Patient not taking: Reported on 07/03/2023)   [START ON 07/30/2023] Semaglutide-Weight Management 2.4 MG/0.75ML SOAJ Inject 2.4 mg into the skin once a week for 28 days. (Patient not taking: Reported on 07/03/2023)   No current facility-administered medications on file prior to visit.   Medications Discontinued During This Encounter  Medication Reason   hydrocortisone (ANUSOL-HC) suppository 25 mg       Objective      07/03/2023    4:25 PM 04/05/2023    8:17 AM 04/05/2023    8:02 AM  Vitals with BMI  Height 5\' 7"   5\' 7"   Weight 218 lbs 13 oz  223 lbs  BMI 34.26  34.92  Systolic 124 121 811  Diastolic 84 81 81  Pulse 89  81   Wt Readings from Last 10 Encounters:  07/03/23 218 lb 12.8 oz (99.2 kg)  04/05/23 223 lb (101.2 kg)  12/03/22 217 lb 3.2 oz (98.5 kg)  10/17/22 218 lb 6.4 oz (99.1 kg)  10/09/22 218 lb 6.4 oz (99.1 kg)  09/10/22 219 lb (99.3 kg)  08/24/22 218 lb (98.9 kg)  08/10/22 218 lb (98.9 kg)  06/29/22 216 lb 4 oz (98.1 kg)  03/30/22 215 lb 3.2 oz (97.6 kg)   Vital signs reviewed.  Nursing notes reviewed. Weight trend reviewed. Physical Exam  Physical Exam RECTAL: Lesion on the medial aspect of the right gluteus, firm in consistency, with increased redness and swelling.  Photographs Taken 07/03/2023 : basically nothing abnormal can be seen although there is a ropy tenderness in the right medial gluteal cheek/cleft.    General Appearance:  No acute distress appreciable.   Well-groomed, healthy-appearing male.  Well proportioned with no abnormal fat distribution.  Good muscle tone. Pulmonary:  Normal work of breathing at rest, no respiratory distress apparent. SpO2: 95 %  Musculoskeletal: All extremities are intact.  Neurological:  Awake, alert,  oriented, and engaged.  No obvious focal neurological deficits or cognitive impairments.  Sensorium seems unclouded.   Speech is clear and coherent with logical content. Psychiatric:  Appropriate mood, pleasant and cooperative demeanor, thoughtful and engaged during the exam    No results found for any visits on 07/03/23. No visits with results within 1 Year(s) from this visit.  Latest known visit with results is:  Office Visit on 03/30/2022  Component Date Value   WBC 03/30/2022 7.7    RBC 03/30/2022 5.49    Hemoglobin 03/30/2022 15.7    HCT 03/30/2022 46.0    MCV 03/30/2022 83.7    MCHC 03/30/2022 34.1    RDW 03/30/2022 13.4    Platelets 03/30/2022 269.0    Neutrophils Relative % 03/30/2022 59.8    Lymphocytes Relative 03/30/2022 32.4    Monocytes Relative 03/30/2022 6.2    Eosinophils Relative 03/30/2022 1.0    Basophils Relative 03/30/2022 0.6    Neutro Abs 03/30/2022 4.6    Lymphs Abs 03/30/2022 2.5    Monocytes Absolute 03/30/2022 0.5    Eosinophils Absolute 03/30/2022 0.1    Basophils Absolute 03/30/2022 0.0  Sodium 03/30/2022 138    Potassium 03/30/2022 4.3    Chloride 03/30/2022 100    CO2 03/30/2022 29    Glucose, Bld 03/30/2022 92    BUN 03/30/2022 17    Creatinine, Ser 03/30/2022 1.14    Total Bilirubin 03/30/2022 0.7    Alkaline Phosphatase 03/30/2022 60    AST 03/30/2022 26    ALT 03/30/2022 43    Total Protein 03/30/2022 7.9    Albumin 03/30/2022 5.1    GFR 03/30/2022 81.63    Calcium 03/30/2022 10.2    Cholesterol 03/30/2022 228 (H)    Triglycerides 03/30/2022 122.0    HDL 03/30/2022 65.50    VLDL 03/30/2022 24.4    LDL Cholesterol 03/30/2022 139 (H)    Total CHOL/HDL Ratio 03/30/2022 3    NonHDL 03/30/2022 162.98    Hgb A1c MFr Bld 03/30/2022 5.9    TSH 03/30/2022 1.17   No image results found. No results found.     Assessment & Plan Internal thrombosed hemorrhoids Recurrent Gluteal Abscess ? Vs recurrent hemorrhoids flare without  infection(s). A recurrent abscess on the medial aspect of the right gluteus was noticed two days ago. Previous treatment with antibiotics was successful, suggesting reintroduction of bacteria rather than a true recurrence. Discussed potential reintroduction through sex toys. The abscess is firm, small, non-fluctuating, and not causing significant pain or functional impairment. Abscesses typically require surgical drainage, but given the previous response to antibiotics, non-surgical resolution is possible. Prompt antibiotic treatment is emphasized to prevent worsening. Prescribe antibiotics and monitor for signs of worsening infection or need for surgical drainage. Advise against potential reintroduction of bacteria through sex toys.  Current medications were reviewed, confirming discontinuation of ashwagandha, propranolol, and Wegovy. No falls reported. Discussed recent health insurance changes affecting medication coverage. Monitor weight and general health, and encourage healthy lifestyle behaviors.  Follow-up Schedule a follow-up appointment if symptoms persist or worsen. Consider referral to a rectal surgeon if surgical drainage becomes necessary. Gluteal abscess Possible, though doubt.  Will give antibiotic(s) due to history severe infection when ignored in past.       Orders Placed During this Encounter:  No orders of the defined types were placed in this encounter.  Meds ordered this encounter  Medications   cephALEXin (KEFLEX) 500 MG capsule    Sig: Take 1 capsule (500 mg total) by mouth 3 (three) times daily.    Dispense:  30 capsule    Refill:  0   DISCONTD: hydrocortisone (ANUSOL-HC) suppository 25 mg   hydrocortisone (ANUSOL-HC) 25 MG suppository    Sig: Place 1 suppository (25 mg total) rectally 2 (two) times daily.    Dispense:  12 suppository    Refill:  0   **This document was synthesized by artificial intelligence (Abridge) using HIPAA-compliant recording of the clinical  interaction;   We discussed the use of AI scribe software for clinical note transcription with the patient, who gave verbal consent to proceed.    Additional Info: This encounter employed state-of-the-art, real-time, collaborative documentation. The patient actively reviewed and assisted in updating their electronic medical record on a shared screen, ensuring transparency and facilitating joint problem-solving for the problem list, overview, and plan. This approach promotes accurate, informed care. The treatment plan was discussed and reviewed in detail, including medication safety, potential side effects, and all patient questions. We confirmed understanding and comfort with the plan. Follow-up instructions were established, including contacting the office for any concerns, returning if symptoms worsen, persist, or new symptoms develop, and  precautions for potential emergency department visits.

## 2023-07-04 NOTE — Patient Instructions (Signed)
 VISIT SUMMARY:  Today, we discussed your recurrent infection on the right gluteus, your ongoing depression, and general health maintenance. We reviewed your current medications and recent changes in your health insurance benefits.  YOUR PLAN:  -RECURRENT GLUTEAL ABSCESS: A gluteal abscess is a collection of pus that forms in the buttock area. You noticed a firm and tender spot on your right gluteus two days ago. We discussed that this might be due to reintroduction of bacteria, possibly through the use of a sex toy. You have been prescribed antibiotics to treat the infection. Please monitor the area for any signs of worsening, such as increased pain, redness, or swelling, and avoid using the sex toy to prevent reintroduction of bacteria.  -DEPRESSION: Depression is a mental health condition characterized by persistent feelings of sadness and loss of interest. You mentioned that your depression is ongoing but without any new symptoms. We will continue with your current management plan and keep monitoring your condition.  -GENERAL HEALTH MAINTENANCE: We reviewed your current medications and noted that you have stopped taking ashwagandha, propranolol, and Wegovy. We discussed the recent changes in your health insurance benefits and how it affects your medication coverage. Please continue to monitor your weight and maintain a healthy lifestyle.  INSTRUCTIONS:  Please schedule a follow-up appointment if your symptoms persist or worsen. If the abscess does not improve with antibiotics, we may need to refer you to a rectal surgeon for possible surgical drainage.  For more information, you can read your full clinical note, available in your patient portal.  It was a pleasure seeing you today! Your health and satisfaction are our top priorities.  Glenetta Hew, MD  Your Providers PCP: Lula Olszewski, MD,  [phone]) Referring Provider: Lula Olszewski, MD,  [phone]) Care Team Provider:  Corie Chiquito, PMHNP Care Team Provider: Shon Millet, MD,  [phone]) Care Team Provider: Richardean Sale, [place],  [phone])     NEXT STEPS: [x]  Early Intervention: Schedule sooner appointment, call our on-call services, or go to emergency room if there is any significant Increase in pain or discomfort New or worsening symptoms Sudden or severe changes in your health [x]  Flexible Follow-Up: We recommend a No follow-ups on file. for optimal routine care. This allows for progress monitoring and treatment adjustments. [x]  Preventive Care: Schedule your annual preventive care visit! It's typically covered by insurance and helps identify potential health issues early. [x]  Lab & X-ray Appointments: Incomplete tests scheduled today, or call to schedule. X-rays: [facility] (M-F, 8:30am-noon or 1pm-5pm). [x]  Medical Information Release: Sign a release form at front desk to obtain relevant medical information we don't have.  MAKING THE MOST OF OUR FOCUSED 20 MINUTE APPOINTMENTS: [x]   Clearly state your top concerns at the beginning of the visit to focus our discussion [x]   If you anticipate you will need more time, please inform the front desk during scheduling - we can book multiple appointments in the same week. [x]   If you have transportation problems- use our convenient video appointments or ask about transportation support. [x]   We can get down to business faster if you use MyChart to update information before the visit and submit non-urgent questions before your visit. Thank you for taking the time to provide details through MyChart.  Let our nurse know and she can import this information into your encounter documents.  Arrival and Wait Times: [x]   Arriving on time ensures that everyone receives prompt attention. [x]   Early morning (8a) and  afternoon (1p) appointments tend to have shortest wait times. [x]   Unfortunately, we cannot delay appointments for late  arrivals or hold slots during phone calls.  Getting Answers and Following Up [x]   Simple Questions & Concerns: For quick questions or basic follow-up after your visit, reach [place] at [phone] or MyChart messaging. [x]   Complex Concerns: If your concern is more complex, scheduling an appointment might be best. Discuss this with the staff to find the most suitable option. [x]   Lab & Imaging Results: We'll contact you directly if results are abnormal or you don't use MyChart. Most normal results will be on MyChart within 2-3 business days, with a review message from Dr. Jon Billings. Haven't heard back in 2 weeks? Need results sooner? Contact us at [phone]. [x]   Referrals: Our referral coordinator will manage specialist referrals. The specialist's office should contact you within 2 weeks to schedule an appointment. Call us if you haven't heard from them after 2 weeks.  Staying Connected [x]   MyChart: Activate your MyChart for the fastest way to access results and message [place]. See the last page of this paperwork for instructions on how to activate.  Bring to Your Next Appointment [x]   Medications: Please bring all your medication bottles to your next appointment to ensure we have an accurate record of your prescriptions. [x]   Health Diaries: If you're monitoring any health conditions at home, keeping a diary of your readings can be very helpful for discussions at your next appointment.  Billing [x]   X-ray & Lab Orders: These are billed by separate companies. Contact the invoicing company directly for questions or concerns. [x]   Visit Charges: Discuss any billing inquiries with our administrative services team.  Your Satisfaction Matters [x]   Share Your Experience: We strive for your satisfaction! If you have any complaints, or preferably compliments, please let Dr. Jon Billings know directly or contact our Practice Administrators, Edwena Felty or [employer], by asking at the front desk.    Reviewing Your Records [x]   Review this early draft of your clinical encounter notes below and the final encounter summary tomorrow on MyChart after its been completed.  All orders placed so far are visible here: Internal thrombosed hemorrhoids -     Hydrocortisone Acetate; Place 1 suppository (25 mg total) rectally 2 (two) times daily.  Dispense: 12 suppository; Refill: 0  Gluteal abscess -     Cephalexin; Take 1 capsule (500 mg total) by mouth 3 (three) times daily.  Dispense: 30 capsule; Refill: 0

## 2023-11-18 ENCOUNTER — Ambulatory Visit (INDEPENDENT_AMBULATORY_CARE_PROVIDER_SITE_OTHER): Admitting: Psychiatry

## 2023-11-18 ENCOUNTER — Telehealth: Payer: Self-pay | Admitting: Psychiatry

## 2023-11-18 ENCOUNTER — Encounter: Payer: Self-pay | Admitting: Psychiatry

## 2023-11-18 DIAGNOSIS — F3181 Bipolar II disorder: Secondary | ICD-10-CM

## 2023-11-18 NOTE — Progress Notes (Unsigned)
 [facility] Counsel[facility] Initial Adult Exam  Name: Mark Fritz Date: 11/18/2023 MRN: [id number] DOB: April 08, 1986 PCP: Jesus Bernardino MATSU, MD  Time spent: 59 minutes Start 4:00 PM end time 4:59 PM Virtual Visit via Video Note Connected with patient by a telemedicine/telehealth application, with their informed consent, and verified patient privacy and that I am speaking with the correct person using two identifiers. I discussed the limitations, risks, security and privacy concerns of performing psychotherapy and the availability of in person appointments. I also discussed with the patient that there may be a patient responsible charge related to this service. The patient expressed understanding and agreed to proceed. I discussed the treatment planning with the patient. The patient was provided an opportunity to ask questions and all were answered. The patient agreed with the plan and demonstrated an understanding of the instructions. The patient was advised to call  our office if  symptoms worsen or feel they are in a crisis state and need immediate contact.   Therapist Location: home Patient Location: home     Guardian/Payee:  Patient     Paperwork requested:  Yes   Reason for Visit /Presenting Problem: Patient met for virtual session. He shared that wife referred patient to clinician since she had responded well to techniques in the past. He shared he currently patient of Harlene Pepper PMHNP and has been diagnosed with bipolar II and he is going through medication adjustments. He is currently tapering off his antidepressant and his ADHD medication. He shared that he has had some rapid cycling and extreme anger and irritability. He has been feeling better recently. He shared he has met with other people in therapy but he feels that it has not helped as much as he had hoped. He is starting to feel the rumination has decreased. He is trying to exercise which he knows helps. He does have to be careful  not to think about the news too much because it can trigger despair and rage.He shared that in his work situation there is lots of unpredictability which is hard for him. 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  Mental Status Exam:    Appearance:   Well Groomed     Behavior:  Appropriate  Motor:  Normal  Speech/Language:   Normal Rate  Affect:  Appropriate  Mood:  anxious  Thought process:  normal  Thought content:    WNL  Sensory/Perceptual disturbances:    WNL  Orientation:  oriented to person, place, time/date, and situation  Attention:  Good  Concentration:  Good  Memory:  WNL  Fund of knowledge:   Good  Insight:    Good  Judgment:   Good  Impulse Control:  Good   Reported Symptoms:  focusing issues, anxiety, rumination, sleep issues, depression, irritability, racing thoughts, memory issues, excessive talking  Risk Assessment: Danger to Self:  No Self-injurious Behavior: No Danger to Others: No Duty to Warn:no Physical Aggression / Violence:No  Access to Firearms a concern: No  Gang Involvement:No  Patient / guardian was educated about steps to take if suicide or homicide risk level increases between visits: yes While future psychiatric events cannot be accurately predicted, the patient does not currently require acute inpatient psychiatric care and does not currently meet [place]  involuntary commitment criteria.  Substance Abuse History: Current substance abuse: No  Past Psychiatric History:   Previous psychological history is significant for depression Outpatient Providers:Jessica Franchot PMHNP History of Psych Hospitalization: No  Psychological Testing: none   Abuse History: Victim of No., none   Report needed: No. Victim of Neglect:No. Perpetrator of none  Witness / Exposure to  Domestic Violence: No   Protective Services Involvement: No  Witness to [employer] Violence:  No   Family History:  Family History  Problem Relation Age of Onset   Depression Mother    Diabetes Mother    Miscarriages / [place] Mother    Hearing loss Father    Hypertension Father    Hyperlipidemia Father    Obesity Father    Depression Sister    Anxiety disorder Sister    Obesity Sister    Anxiety disorder Sister    Depression Sister    Obesity Sister    Mental illness Maternal Grandmother    Depression Maternal Grandmother    Bipolar disorder Maternal Grandmother    Hypertension Paternal Grandfather    Stroke Paternal Grandfather    Autism Son    Anxiety disorder Son    Tics Son    Anxiety disorder Son     Living situation: the patient lives with their family  Sexual Orientation:  Straight  Relationship Status: married  Name of spouse / other:[name]             If a parent, number of children / ages: son 72, daughter 69 , son 10  Support Systems; spouse friends parents Siblings Psychologist, occupational, boss  Financial Stress:  Yes   Income/Employment/Disability: Employment  Financial planner: No   Educational History: Education: post Engineer, maintenance (IT) work or degree  Religion/Sprituality/World View:   Mormon  Any cultural differences that may affect / interfere with treatment:  not applicable   Recreation/Hobbies: hiking Insurance account manager reading  Stressors:Other: struggling to stay on top of work, things have changed often at work, difficulty with organization    Strengths:  Supportive Relationships, Family, Friends, and [employer]  Barriers:  none   Legal History: Pending legal issue / charges: The patient has no significant history of legal issues. 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     Amphetamine  Sulfate (EVEKEO ) 10 MG TABS Take 1 tablet (10 mg total) by mouth 2 (two) times daily. 60 tablet 0   Amphetamine  Sulfate (EVEKEO ) 10 MG TABS Take 1 tablet (10 mg total) by mouth 2 (two) times daily. 60 tablet 0   Amphetamine  Sulfate (EVEKEO ) 10 MG TABS Take 1 tablet (10 mg total) by mouth 2 (two) times daily. 60 tablet 0  ASHWAGANDHA PO Take by mouth. (Patient not taking: Reported on 07/03/2023)     baclofen  (LIORESAL ) 10 MG tablet Take 1 tablet (10 mg total) by mouth 3 (three) times daily. (Patient taking differently: Take 10 mg by mouth as needed.) 30 each 0   busPIRone  (BUSPAR ) 15 MG tablet TAKE 1 TABLET TWICE DAILY 180 tablet 0   cephALEXin  (KEFLEX ) 500 MG capsule Take 1 capsule (500 mg total) by mouth 3 (three) times daily. 30 capsule 0   doxylamine, Sleep, (UNISOM) 25 MG tablet Take 12.5 mg by mouth at bedtime as needed.     hydrocortisone  (ANUSOL -HC) 25 MG suppository Place 1 suppository (25 mg total) rectally 2 (two) times daily. 12 suppository 0   hydrOXYzine  (VISTARIL ) 25 MG capsule Take 25 mg by mouth at bedtime as needed for anxiety.     propranolol  (INDERAL ) 10 MG tablet Take by mouth. (Patient not taking: Reported on 07/03/2023)     Vilazodone  HCl 20 MG TABS Take 1 tablet (20 mg total) by mouth daily with  breakfast. 90 tablet 1   No current facility-administered medications for this visit.    Allergies  Allergen Reactions   Bupropion  Other (See Comments)    Anger/ Enraged behavior    Diagnoses:    ICD-10-CM   1. Bipolar II disorder (HCC)  F31.81       Plan of Care: Patient is to develop treatment plan and goals at next session. He is to take medication as directed and continue with provider Harlene Pepper PMHNP.   Silvano Pacini, [facility]

## 2023-11-18 NOTE — Telephone Encounter (Signed)
 Mr. Mark Fritz, Mark Fritz are scheduled for a virtual visit with your provider today.    Just as we do with appointments in the office, we must obtain your consent to participate.  Your consent will be active for this visit and any virtual visit you may have with one of our providers in the next 365 days.    If you have a MyChart account, I can also send a copy of this consent to you electronically.  All virtual visits are billed to your insurance company just like a traditional visit in the office.  As this is a virtual visit, video technology does not allow for your provider to perform a traditional examination.  This may limit your provider's ability to fully assess your condition.  If your provider identifies any concerns that need to be evaluated in person or the need to arrange testing such as labs, EKG, etc, we will make arrangements to do so.    Although advances in technology are sophisticated, we cannot ensure that it will always work on either your end or our end.  If the connection with a video visit is poor, we may have to switch to a telephone visit.  With either a video or telephone visit, we are not always able to ensure that we have a secure connection.   I need to obtain your verbal consent now.   Are you willing to proceed with your visit today?   Mark Fritz has provided verbal consent on 11/18/2023 for a virtual visit (video or telephone).   Silvano Pacini, [facility] 11/18/2023  4:02 PM

## 2023-11-23 ENCOUNTER — Emergency Department (HOSPITAL_BASED_OUTPATIENT_CLINIC_OR_DEPARTMENT_OTHER)
Admission: EM | Admit: 2023-11-23 | Discharge: 2023-11-23 | Disposition: A | Discharge status: Home / Self Care | Attending: Emergency Medicine | Admitting: Emergency Medicine

## 2023-11-23 ENCOUNTER — Other Ambulatory Visit: Payer: Self-pay

## 2023-11-23 DIAGNOSIS — K611 Rectal abscess: Secondary | ICD-10-CM | POA: Diagnosis present

## 2023-11-23 LAB — CBC
HCT: 41.9 % (ref 39.0–52.0)
Hemoglobin: 14.6 g/dL (ref 13.0–17.0)
MCH: 28.6 pg (ref 26.0–34.0)
MCHC: 34.8 g/dL (ref 30.0–36.0)
MCV: 82 fL (ref 80.0–100.0)
Platelets: 258 K/uL (ref 150–400)
RBC: 5.11 MIL/uL (ref 4.22–5.81)
RDW: 12.7 % (ref 11.5–15.5)
WBC: 7.6 K/uL (ref 4.0–10.5)
nRBC: 0 % (ref 0.0–0.2)

## 2023-11-23 LAB — BASIC METABOLIC PANEL WITH GFR
Anion gap: 12 (ref 5–15)
BUN: 14 mg/dL (ref 6–20)
CO2: 24 mmol/L (ref 22–32)
Calcium: 9.8 mg/dL (ref 8.9–10.3)
Chloride: 104 mmol/L (ref 98–111)
Creatinine, Ser: 1.2 mg/dL (ref 0.61–1.24)
GFR, Estimated: >60 mL/min (ref >60)
Glucose, Bld: 94 mg/dL (ref 70–99)
Potassium: 3.6 mmol/L (ref 3.5–5.1)
Sodium: 140 mmol/L (ref 135–145)

## 2023-11-23 MED ORDER — CEPHALEXIN 500 MG PO CAPS: 500.0000 mg | ORAL_CAPSULE | Freq: Three times a day (TID) | ORAL | 0 refills | Status: AC

## 2023-11-23 MED ORDER — DOXYCYCLINE HYCLATE 100 MG PO CAPS: 100.0000 mg | ORAL_CAPSULE | Freq: Two times a day (BID) | ORAL | 0 refills | Status: DC

## 2023-11-23 NOTE — ED Provider Notes (Signed)
 [place] EMERGENCY DEPARTMENT AT [facility] Provider Note   CSN: [id number] Arrival date & time: 11/23/23  8062     Patient presents with: No chief complaint on file.   Mark Fritz is a 38 y.o. male with past medical history of HLD, rectal abscesses presents emergency department for evaluation of tender nodule to right inner buttocks.  Reports that he started feeling something 1 week ago but sought ED evaluation today as he started having pain with sitting.  Reports that he has had this occur 3 times in the past in the same region.  PCP has evaluated these abscesses in the past and provided Keflex  and Doxy with resolution of abscesses.  He has not seen general surgery.  Denies difficulty defecating, fevers   HPI     Prior to Admission medications   Medication Sig Start Date End Date Taking? Authorizing Provider  acetaminophen  (TYLENOL ) 325 MG tablet Take 650 mg by mouth every 6 (six) hours as needed.    [provider]  Amphetamine  Sulfate (EVEKEO ) 10 MG TABS Take 1 tablet (10 mg total) by mouth 2 (two) times daily. 04/22/23   Franchot Harlene SQUIBB, PMHNP  Amphetamine  Sulfate (EVEKEO ) 10 MG TABS Take 1 tablet (10 mg total) by mouth 2 (two) times daily. 05/20/23   Franchot Harlene SQUIBB, PMHNP  Amphetamine  Sulfate (EVEKEO ) 10 MG TABS Take 1 tablet (10 mg total) by mouth 2 (two) times daily. 06/17/23   Franchot Harlene SQUIBB, PMHNP  ASHWAGANDHA PO Take by mouth. Patient not taking: Reported on 07/03/2023    [provider]  baclofen  (LIORESAL ) 10 MG tablet Take 1 tablet (10 mg total) by mouth 3 (three) times daily. Patient taking differently: Take 10 mg by mouth as needed. 10/13/22   Dasie Faden, MD  busPIRone  (BUSPAR ) 15 MG tablet TAKE 1 TABLET TWICE DAILY 05/09/23   Franchot Harlene SQUIBB, PMHNP  cephALEXin  (KEFLEX ) 500 MG capsule Take 1 capsule (500 mg total) by mouth 3 (three) times daily. 07/03/23   Jesus Bernardino MATSU, MD  doxylamine, Sleep, (UNISOM) 25 MG tablet Take 12.5 mg  by mouth at bedtime as needed.    [provider]  hydrocortisone  (ANUSOL -HC) 25 MG suppository Place 1 suppository (25 mg total) rectally 2 (two) times daily. 07/03/23   Jesus Bernardino MATSU, MD  hydrOXYzine  (VISTARIL ) 25 MG capsule Take 25 mg by mouth at bedtime as needed for anxiety.    [provider]  propranolol  (INDERAL ) 10 MG tablet Take by mouth. Patient not taking: Reported on 07/03/2023    [provider]  Vilazodone  HCl 20 MG TABS Take 1 tablet (20 mg total) by mouth daily with breakfast. 04/15/23   Franchot Harlene SQUIBB, PMHNP    Allergies: Bupropion     Review of Systems  Skin:  Positive for wound.    Updated Vital Signs BP (!) 140/84   Pulse 71   Temp (!) 97.5 F (36.4 C)   Resp 16   Ht 5' 7 (1.702 m)   Wt 97.5 kg   SpO2 98%   BMI 33.67 kg/m   Physical Exam Vitals and nursing note reviewed. Exam conducted with a chaperone present.  Constitutional:      General: He is not in acute distress.    Appearance: Normal appearance.  HENT:     Head: Normocephalic and atraumatic.  Eyes:     Conjunctiva/sclera: Conjunctivae normal.  Cardiovascular:     Rate and Rhythm: Normal rate.  Pulmonary:     Effort: Pulmonary  effort is normal. No respiratory distress.     Breath sounds: Normal breath sounds.  Genitourinary:    Prostate: Nodules present.   Skin:    Capillary Refill: Capillary refill takes less than 2 seconds.     Coloration: Skin is not jaundiced or pale.  Neurological:     Mental Status: He is alert. Mental status is at baseline.   GU exam performed with Delanna Polite, RN  (all labs ordered are listed, but only abnormal results are displayed) Labs Reviewed  CBC  BASIC METABOLIC PANEL WITH GFR    EKG: None  Radiology: No results found.   SABRAUltrasound ED Soft Tissue  Date/Time: 11/23/2023 10:37 PM  Performed by: Minnie Tinnie BRAVO, PA Authorized by: Minnie Tinnie BRAVO, PA   Procedure details:    Indications: localization of abscess  and evaluate for cellulitis     Images: archived   Location:    Location: buttocks     Side:  Right Findings:     abscess present Comments:     1.5 x 0.65 cm abscess noted on ultrasound    Medications Ordered in the ED - No data to display                                  Medical Decision Making Amount and/or Complexity of Data Reviewed Labs: ordered.  Risk Prescription drug management.   Patient presents to the ED for concern of tender nodule on right gluteal cleft, this involves an extensive number of treatment options, and is a complaint that carries with it a high risk of complications and morbidity.  The differential diagnosis includes abscess, fistula, cellulitis, lipoma   Co morbidities that complicate the patient evaluation  Past medical history of perirectal abscesses   Additional history obtained:  Additional history obtained from Nursing and Outside Medical Records   External records from outside source obtained and reviewed including triage RN note, PCP note with perirectal abscess on 07/23/2023, and PCP note from 10/09/2022   Lab Tests:  I Ordered, and personally interpreted labs.  The pertinent results include:   No leukocytosis     Medicines ordered and prescription drug management:  I ordered medication including keflex , doxy  for perirectal abscess  I have reviewed the patients home medicines and have made adjustments as needed    Problem List / ED Course:  perirectal abscess 1.5x0.6cm mildly tender perirectal abscess. Does not appear to be involved with anus.  No surrounding warmth, erythema, nor streaking Shared decision making is had regarding draining abscess. He does not wish to have it drained at this time and wishes to proceed with abx, general sx f/u I had conversation regarding CT to ensure there is no fistula as cause of repeated abscesses however, unable to do specific CT fistula abscess here at [facility].  Do not feel that CT is  needed to define abscesses have defined this with ultrasound.  Shared decision-making is had with patient regarding foregoing CT tonight and following up with general surgery for further management and PO abx.  I think this is reasonable as patient shows no signs of sepsis.  He is hemodynamically stable with no tachycardia nor fever.  He has no leukocytosis. Plan to provide doxy and keflex  prescription as this has resolved abscesses in the past   Reevaluation:  After the interventions noted above, I reevaluated the patient and found that they have :stayed the same  Social Determinants of Health:  Has pcp f/u   Dispostion:  After consideration of the diagnostic results and the patients response to treatment, I feel that the patent would benefit from outpatient management.   Discussed ED workup, disposition, return to ED precautions with patient who expresses understanding agrees with plan.  All questions answered to their satisfaction.  They are agreeable to plan.  Discharge instructions provided on paperwork  Final diagnoses:  Perirectal abscess    ED Discharge Orders     None        Minnie Tinnie BRAVO, PA 11/23/23 [id number]    Lenor Hollering, MD 11/23/23 [phone]

## 2023-11-23 NOTE — ED Notes (Signed)
Reviewed discharge instructions, medications, and home care with pt. Pt verbalized understanding and had no further questions. Pt exited ED without complications.

## 2023-11-23 NOTE — ED Triage Notes (Signed)
 Pt POV reporting perianal abscess, recurrent issue, afebrile.

## 2023-11-23 NOTE — Discharge Instructions (Addendum)
 Thank you for letting us  evaluate you today.  Your lab work was unremarkable with no electrolyte abnormalities, elevated white blood cell count to indicate infection or inflammation.  I provided you with general surgery follow-up for further management of recurrent perirectal abscesses despite antibiotic treatment. I have also provided you with antibiotics for abscess

## 2023-12-09 ENCOUNTER — Ambulatory Visit (INDEPENDENT_AMBULATORY_CARE_PROVIDER_SITE_OTHER): Admitting: Psychiatry

## 2023-12-09 DIAGNOSIS — F3181 Bipolar II disorder: Secondary | ICD-10-CM | POA: Diagnosis not present

## 2023-12-09 NOTE — Progress Notes (Signed)
 [facility] Counselor/Therapist Progress Note  Patient ID: Mark Fritz, MRN: [id number],    Date: 12/09/2023  Time Spent: 62 minutes  start time 12:04 p.m. end time 1:06 PM Virtual Visit via Video Note Connected with patient by a telemedicine/telehealth application, with their informed consent, and verified patient privacy and that I am speaking with the correct person using two identifiers. I discussed the limitations, risks, security and privacy concerns of performing psychotherapy and the availability of in person appointments. I also discussed with the patient that there may be a patient responsible charge related to this service. The patient expressed understanding and agreed to proceed. I discussed the treatment planning with the patient. The patient was provided an opportunity to ask questions and all were answered. The patient agreed with the plan and demonstrated an understanding of the instructions. The patient was advised to call  our office if  symptoms worsen or feel they are in a crisis state and need immediate contact.   Therapist Location: home Patient Location: home    Treatment Type: Individual Therapy  Reported Symptoms: sadness, intrusive thoughts, rumination, anxiety, focusing issues, mood issues  Mental Status Exam:  Appearance:   Well Groomed     Behavior:  Appropriate  Motor:  Normal  Speech/Language:   Normal Rate  Affect:  Appropriate and Tearful  Mood:  anxious and sad  Thought process:  normal  Thought content:    WNL  Sensory/Perceptual disturbances:    WNL  Orientation:  oriented to person, place, time/date, and situation  Attention:  Good  Concentration:  Good  Memory:  WNL  Fund of knowledge:   Good  Insight:    Good  Judgment:   Good  Impulse Control:  Good   Risk Assessment: Danger to Self:  No Self-injurious Behavior: No Danger to Others: No Duty to Warn:no Physical Aggression / Violence:No  Access to Firearms a concern: No   Gang Involvement:No   Subjective: Met with patient for session. He shared his mood has been up and down. He shared he is coming off of some medication, he is working with his provider.  He has decided that he will talk to his provider about the possibility of being on ADHD medication and that is non stimulant. He shared that he feels he is holding back a flood of sadness all the time. He feels he is like an after thought. He shared that there is broken potential. 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  He went on to explain that he is compared himself to his brother who is doing very well and has been successful financially.  Patient was encouraged to think through the things that were most important to him.  These things included Family knows that he loves them, Feel like he can help people, Family to be safe.  He feels over whelming  pain/sorrow and inadequacy. Have to find the cause of that. He shared he is doing better at not aiming that at wife or kids. He yells it started in [employer] school he stated he was average or below average  After always being able to get by with little effort.  Patient was encouraged to expand on that and as he did it came out that he did not really have any study skills are know how he learns the best.  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  I developed a treatment plan and set goals that addressed the issues that were disturbing to him.  Taught patient breathing exercises and grounding exercises at the end of session.  Encouraged him to try the breathing exercises every hour and to start getting in the habit of working on his breathing.  Also encouraged him to get up and go for a 5-minute walk if he  finds that he is avoiding things and not able to get focused.  Interventions: Solution-Oriented/Positive Psychology and Insight-Oriented  Diagnosis:   ICD-10-CM   1. Bipolar II disorder (HCC)  F31.81       Plan: Patient is to discuss grounding exercises and breathing exercises from session.  Patient is to work on exercising for a few minutes when he is unable to focus and get started on task. He is to take medication as directed and continue with provider Harlene Pepper PMHNP.    Silvano Pacini, [facility]

## 2023-12-23 ENCOUNTER — Ambulatory Visit (INDEPENDENT_AMBULATORY_CARE_PROVIDER_SITE_OTHER): Admitting: Psychiatry

## 2023-12-23 DIAGNOSIS — F3181 Bipolar II disorder: Secondary | ICD-10-CM

## 2023-12-23 NOTE — Progress Notes (Signed)
 [facility] Counselor/Therapist Progress Note  Patient ID: Mark Fritz, MRN: [id number],    Date: 12/23/2023  Time Spent: 61 minutes start time 12:00 PM end time 1:01 PM Virtual Visit via Video Note Connected with patient by a telemedicine/telehealth application, with their informed consent, and verified patient privacy and that I am speaking with the correct person using two identifiers. I discussed the limitations, risks, security and privacy concerns of performing psychotherapy and the availability of in person appointments. I also discussed with the patient that there may be a patient responsible charge related to this service. The patient expressed understanding and agreed to proceed. I discussed the treatment planning with the patient. The patient was provided an opportunity to ask questions and all were answered. The patient agreed with the plan and demonstrated an understanding of the instructions. The patient was advised to call  our office if  symptoms worsen or feel they are in a crisis state and need immediate contact.   Therapist Location: home Patient Location: home     Treatment Type: Individual Therapy  Reported Symptoms:   focusing issues, anger out bursts, triggered responses, rumination  Mental Status Exam:  Appearance:   Well Groomed     Behavior:  Appropriate  Motor:  Normal  Speech/Language:   Normal Rate  Affect:  Appropriate  Mood:  normal  Thought process:  normal  Thought content:    WNL  Sensory/Perceptual disturbances:    WNL  Orientation:  oriented to person, place, time/date, and situation  Attention:  Good  Concentration:  Good  Memory:  WNL  Fund of knowledge:   Good  Insight:    Good  Judgment:   Good  Impulse Control:  Good   Risk Assessment: Danger to Self:  No Self-injurious Behavior: No Danger to Others: No Duty to Warn:no Physical Aggression / Violence:No  Access to Firearms a concern: No  Gang Involvement:No   Subjective:  Met with patient via virtual session. He shared that [facility] had given him the wrong dose of medication and that may have been the reason that things have not been working as well. He still has times when he is having a hard time focusing and getting tasks completed.  He shared he has lots of frustration over the things going on in the world and that people he cares about chose the direction that things are going. His wife started working again was difficult. He did try the exercises and found them to be okay. He has found brainspotting bilateral music helpful. He did have a hard conversation with his boss he is trying not to spiral about that.  He shared he told his boss he is going through med changes and when there was a bump she got she got upset. He is exercising more and realizing that he might not need as much sleep. Did processing set on people that he is upset with currently. People having wealth and allowing others to suffer. Picture person in his church talking about being able to take advantage of others and making lots of money. SUDS level 9, negative cognition I'm powerless, felt despair in his chest and shoulder.  Patient did not respond well to the exercise.  Encouraged him to do some cognitive reframing and to recognize that he does not have to the things going on with others personal.  And that he and his wife are making good decisions with his children and that is what is most important at  the end of the day.  Patient was also encouraged to take 5 minutes of exercise or stretching when he feels he is not focused the way he needs to do on tasks at work.  Interventions: Solution-Oriented/Positive Psychology, Eye Movement Desensitization and Reprocessing (EMDR), and Insight-Oriented  Diagnosis:   ICD-10-CM   1. Bipolar II disorder (HCC)  F31.81       Plan: Patient is to discuss grounding exercises and breathing exercises from session.  Patient is to work on exercising for a few minutes when  he is unable to focus and get started on task. He is to take medication as directed and continue with provider Harlene Pepper PMHNP.   Silvano Pacini, [facility]

## 2023-12-31 ENCOUNTER — Ambulatory Visit: Payer: Self-pay | Admitting: General Surgery

## 2023-12-31 NOTE — H&P (Signed)
 REFERRING PHYSICIAN:  Room, Emergency  PROVIDER:  BERNARDA WANDA NED, MD  MRN: [id number] DOB: [birth year] DATE OF ENCOUNTER: 12/31/2023  Subjective   Chief Complaint: New Consultation     History of Present Illness: Mark Fritz is a 38 y.o. male who is seen today as an office consultation at the request of Dr. Room for evaluation of New Consultation .   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  He is status post ED evaluation in July.  This appears to have been treated with antibiotics.  He has had several episodes of recurrence over the past year.  He has not ever had an incision and drainage.  He denies any purulent drainage and occasionally sees some blood on the toilet paper.  Review of Systems: A complete review of systems was obtained from the patient.  I have reviewed this information and discussed as appropriate with the patient.  See HPI as well for other ROS.    Medical History: Past Medical History:  Diagnosis Date   Anxiety    Vasovagal response    has had after surgical procedures    Patient Active Problem List  Diagnosis   ADHD (attention deficit hyperactivity disorder)   Fitting and adjustment of artificial eye   Major depressive disorder, recurrent episode, moderate (CMS/HHS-HCC)   Primary osteoarthritis of right hand    Past Surgical History:  Procedure Laterality Date   bunionectomy  04/30/2006   EYE SURGERY Right 2018   eyelid surgery   EVISCERATION OCULAR CONTENTS W/IMPLANT Right 01/18/2021   Procedure: EVISCERATION OF OCULAR CONTENTS WITH IMPLANT RIGHT EYE;  Surgeon: Ashley Greig Jansky, MD;  Location: [facility] OR;  Service: Ophthalmology;  Laterality: Right;   de quervan's release Right    wrist   SPINE SURGERY     VASECTOMY       Allergies  Allergen Reactions   Bupropion  Other (See Comments)    Anger/ Enraged behavior    Current Outpatient Medications on File Prior to Visit  Medication Sig Dispense Refill   busPIRone   (BUSPAR ) 15 MG tablet Take 15 mg by mouth 2 (two) times daily     doxylamine succinate (UNISOM) 25 mg tablet Take 12.5 mg by mouth     hydrocortisone  (ANUSOL -HC) 25 mg suppository Place 25 mg rectally 2 (two) times daily     hydrOXYzine  pamoate (VISTARIL ) 25 MG capsule Take 25 mg by mouth     lamoTRIgine (LAMICTAL) 25 MG tablet TAKE 1 TABLET ONCE DAILY FOR 2 WEEKS, THEN INCREASE TO 2 TABLETS DAILY FOR 2 WEEKS     lithium  carbonate 150 MG capsule Take by mouth STATES TAKING 450 MG EVERY NIGHT     amphetamine  sulfate 10 mg Tab Take 5 mg by mouth 2 (two) times daily     erythromycin (ROMYCIN) ophthalmic ointment Use to sutures 4 times a day until follow up appointment 3.5 g 2   miscellaneous medical supply Misc Dispense one right custom ocular prosthesis 1 each 0   propranoloL  (INDERAL ) 10 MG tablet Take 10 mg by mouth as needed     traMADoL (ULTRAM) 50 mg tablet Take 1 tablet every 4-6 hours as needed for pain not relieved by Tylenol . 6 tablet 0   UNABLE TO FIND Durable Medical Equipment-Prosthetic Eye V52.2 1 each prn   vilazodone  (VIIBRYD ) 20 mg tablet Take 20 mg by mouth once daily As stated by patient     No current facility-administered medications on file prior to visit.    Family History  Problem Relation Age of Onset   Diabetes type II Mother    Depression Mother    Obesity Father    Hyperlipidemia (Elevated cholesterol) Father    High blood pressure (Hypertension) Father    Depression Sister    No Known Problems Sister    No Known Problems Brother    Depression Maternal Grandmother    High blood pressure (Hypertension) Paternal Grandfather    Stroke Paternal Grandfather    No Known Problems Son    No Known Problems Son    No Known Problems Daughter    Cataracts Neg Hx    Glaucoma Neg Hx    Macular degeneration Neg Hx      Social History   Tobacco Use  Smoking Status Never  Smokeless Tobacco Never     Social History   Socioeconomic History   Marital status:  Married  Tobacco Use   Smoking status: Never   Smokeless tobacco: Never  Vaping Use   Vaping status: Never Used  Substance and Sexual Activity   Alcohol use: Not Currently   Drug use: Never   Social Drivers of Health   Housing Stability: Unknown (12/31/2023)   Housing Stability Vital Sign    Homeless in the Last Year: No    Objective:    Vitals:   12/31/23 1049  BP: 132/84  Pulse: 99  Temp: 36.9 C (98.4 F)  SpO2: 96%  Weight: (!) 101.9 kg (224 lb 9.6 oz)  Height: 170.2 cm (5' 7)  PainSc: 0-No pain     Exam Gen: NAD Abd: Soft Rectal: Right anterior lateral subcutaneous mass palpated approximately 1 cm in diameter and 2 cm lateral to the anal opening   Labs, Imaging and Diagnostic Testing:   Assessment and Plan:  Perirectal abscess  (primary encounter diagnosis)   38 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  I have recommended exam under anesthesia with evaluation of the subcutaneous mass and possible interrogation of fistula tract.  If a fistula is found.  We discussed performing a fistulotomy versus seton and the indications for both of these.  Risk include bleeding, recurrence and pain.  All questions were answered.  Patient would like to proceed.   Bernarda JAYSON Ned, MD Colon and Rectal Surgery [facility]

## 2024-01-06 ENCOUNTER — Ambulatory Visit: Admitting: Psychiatry

## 2024-01-20 ENCOUNTER — Ambulatory Visit (INDEPENDENT_AMBULATORY_CARE_PROVIDER_SITE_OTHER): Admitting: Psychiatry

## 2024-01-20 DIAGNOSIS — F3181 Bipolar II disorder: Secondary | ICD-10-CM | POA: Diagnosis not present

## 2024-01-20 NOTE — Progress Notes (Signed)
 [facility] Counselor/Therapist Progress Note  Patient ID: Mark Fritz, MRN: [id number],    Date: 01/20/2024  Time Spent: 45 minutes start time video 12:02 PM to 12:36 PM switch to phone due to computer issues at 12:44 PM and ended at 12:55 PM Virtual Visit via Video Note Connected with patient by a telemedicine/telehealth application, with their informed consent, and verified patient privacy and that I am speaking with the correct person using two identifiers. I discussed the limitations, risks, security and privacy concerns of performing psychotherapy and the availability of in person appointments. I also discussed with the patient that there may be a patient responsible charge related to this service. The patient expressed understanding and agreed to proceed. I discussed the treatment planning with the patient. The patient was provided an opportunity to ask questions and all were answered. The patient agreed with the plan and demonstrated an understanding of the instructions. The patient was advised to call  our office if  symptoms worsen or feel they are in a crisis state and need immediate contact.   Therapist Location: home Patient Location: home    Treatment Type: Individual Therapy  Reported Symptoms: anxiety, intrusive thoughts, rumination, sadness, anger  Mental Status Exam:  Appearance:   Well Groomed     Behavior:  Appropriate  Motor:  Normal  Speech/Language:   Normal Rate  Affect:  Appropriate  Mood:  irritable  Thought process:  normal  Thought content:    WNL  Sensory/Perceptual disturbances:    WNL  Orientation:  oriented to person, place, time/date, and situation  Attention:  Good  Concentration:  Good  Memory:  WNL  Fund of knowledge:   Good  Insight:    Good  Judgment:   Good  Impulse Control:  Good   Risk Assessment: Danger to Self:  intrusive thoughts but is safe  Self-injurious Behavior: No Danger to Others: No Duty to Warn:no Physical  Aggression / Violence:No  Access to Firearms a concern: No  Gang Involvement:No   Subjective: Met with patient via virtual session. He shared that he has been struggling greatly due to being angry with his family and friends who have different political views.  Patient stated that using the bilateral brain spotting music has helped him greatly but that he is unable to do that at work so that is when he can get even more trigger and irritated.  Patient stated he is having lots of anxiety of what may happen to the resources his children need including his autistic son.  Patient stated that he and his wife are trying to figure out how to manage their situation.  Had patient try and think through different CBT filters to help him be able to have a relationship with his extended family and coworkers but still recognize that he does not agree with their belief systems and choices.  Patient was encouraged to think through several different options.  He was finally able to see that if he uses his spiritual beliefs that things can work out even when he cannot see it and that he is able just to love people with differences he can be okay.  Encouraged patient to continue working on the spiritual beliefs that help him stay focused on being the person he wants to be and just caring about the people who have different belief systems.  Interventions: Cognitive Behavioral Therapy and Solution-Oriented/Positive Psychology  Diagnosis:   ICD-10-CM   1. Bipolar II disorder (HCC)  F31.81  Plan: Patient is to discuss grounding exercises and breathing exercises patient is to practice using brain spotting bilateral music as needed.  Patient is to work on cognitive filters discussed in session.  Patient is to work on exercising for a few minutes when he is unable to focus and get started on task. He is to take medication as directed and continue with provider Harlene Pepper PMHNP.   Silvano Pacini,  [facility]

## 2024-02-03 ENCOUNTER — Ambulatory Visit (INDEPENDENT_AMBULATORY_CARE_PROVIDER_SITE_OTHER): Admitting: Psychiatry

## 2024-02-03 DIAGNOSIS — F3181 Bipolar II disorder: Secondary | ICD-10-CM | POA: Diagnosis not present

## 2024-02-03 NOTE — Progress Notes (Signed)
 [facility] Counselor/Therapist Progress Note  Patient ID: Mark Fritz, MRN: [id number],    Date: 02/03/2024  Time Spent: 62 minutes on start time 12:02 PM end time 1:04 PM Virtual Visit via Video Note Connected with patient by a telemedicine/telehealth application, with their informed consent, and verified patient privacy and that I am speaking with the correct person using two identifiers. I discussed the limitations, risks, security and privacy concerns of performing psychotherapy and the availability of in person appointments. I also discussed with the patient that there may be a patient responsible charge related to this service. The patient expressed understanding and agreed to proceed. I discussed the treatment planning with the patient. The patient was provided an opportunity to ask questions and all were answered. The patient agreed with the plan and demonstrated an understanding of the instructions. The patient was advised to call  our office if  symptoms worsen or feel they are in a crisis state and need immediate contact.   Therapist Location: home Patient Location: home    Treatment Type: Individual Therapy  Reported Symptoms: anxiety, triggered responses, sadness, fatigue, irritability, apathetic, focusing issues, intrusive thoughts.  Mental Status Exam:  Appearance:   Well Groomed     Behavior:  Appropriate  Motor:  Normal  Speech/Language:   Normal Rate  Affect:  Appropriate  Mood:  normal  Thought process:  normal  Thought content:    WNL  Sensory/Perceptual disturbances:    WNL  Orientation:  oriented to person, place, time/date, and situation  Attention:  Good  Concentration:  Good  Memory:  WNL  Fund of knowledge:   Good  Insight:    Good  Judgment:   Good  Impulse Control:  Good   Risk Assessment: Danger to Self:  None current but discussed safety plan with patient and making sure that if intrusive thoughts were to escalate in any way he would  maintain safety Self-injurious Behavior: No Danger to Others: No Duty to Warn:no Physical Aggression / Violence:No  Access to Firearms a concern: No  Gang Involvement:No   Subjective: Met with patient via virtual session. He shared that the brainspotting music and breathing has still been helping. He is still struggling with things but he feels progress is being made. He is getting more done at work.  He has to use numbing a distraction to work on functioning. There is a lack of joy and fun currently which is hard.  Patient was allowed time to process what he was feeling and thinking.  Patient shared that he feels so much of his life is just getting through the tasks and he is very frustrated with his career path.  Patient went on to share how things have progressed in his career and he feels so much of it has been mediocre and him  not measuring up.  Patient's thoughts were challenged.  Encouraged him to think through and look at the different facts/truths in the situation.  Patient was encouraged to see how far he has come and what he is persevered through within his family situation and his situations.  He was encouraged to recognize that this time is not forever and to look back and how the past years of changed and to know that the next 10 years can change as well.  He admitted that he and his wife are already thinking about their future together and making some changes so that he has a more for feeling job option.  Encouraged  patient to start seeking out some different work that might be more enjoyable for him to do voluntarily while he is continuing to have to work to provide for his family financially.  Patient to write out all the facts/truths to read when he starts feeling negative.    Interventions: Cognitive Behavioral Therapy, Solution-Oriented/Positive Psychology, and Insight-Oriented  Diagnosis:   ICD-10-CM   1. Bipolar II disorder (HCC)  F31.81       Plan: Patient is to discuss  grounding exercises and breathing exercises patient is to practice using brain spotting bilateral music as needed.  Patient is to write out the things that they have accomplished over the past years and to read the list as he needs to.  Patient is to start seeking volunteer opportunities that may be more for feeling than his current job.  Patient is to work on exercising for a few minutes when he is unable to focus and get started on task. He is to take medication as directed and continue with provider Harlene Pepper PMHNP.     Silvano Pacini, [facility]

## 2024-02-07 ENCOUNTER — Encounter (HOSPITAL_COMMUNITY): Payer: Self-pay

## 2024-02-17 ENCOUNTER — Ambulatory Visit (INDEPENDENT_AMBULATORY_CARE_PROVIDER_SITE_OTHER): Admitting: Psychiatry

## 2024-02-17 DIAGNOSIS — F3181 Bipolar II disorder: Secondary | ICD-10-CM

## 2024-02-17 NOTE — Progress Notes (Signed)
 [facility] Counselor/Therapist Progress Note  Patient ID: Mark Fritz, MRN: [id number],    Date: 02/17/2024  Time Spent: 51 minutes start time 12:00 PM end time 12:51 PM Virtual Visit via Video Note Connected with patient by a telemedicine/telehealth application, with their informed consent, and verified patient privacy and that I am speaking with the correct person using two identifiers. I discussed the limitations, risks, security and privacy concerns of performing psychotherapy and the availability of in person appointments. I also discussed with the patient that there may be a patient responsible charge related to this service. The patient expressed understanding and agreed to proceed. I discussed the treatment planning with the patient. The patient was provided an opportunity to ask questions and all were answered. The patient agreed with the plan and demonstrated an understanding of the instructions. The patient was advised to call  our office if  symptoms worsen or feel they are in a crisis state and need immediate contact.   Therapist Location: home Patient Location: home    Treatment Type: Individual Therapy  Reported Symptoms: depression, rumination, anxiety, triggered responses, irritability  Mental Status Exam:  Appearance:   Well Groomed     Behavior:  Appropriate  Motor:  Normal  Speech/Language:   Normal Rate  Affect:  Appropriate  Mood:  normal  Thought process:  normal  Thought content:    WNL  Sensory/Perceptual disturbances:    WNL  Orientation:  oriented to person, place, time/date, and situation  Attention:  Good  Concentration:  Good  Memory:  WNL  Fund of knowledge:   Good  Insight:    Good  Judgment:   Good  Impulse Control:  Good   Risk Assessment: Danger to Self:  No Self-injurious Behavior: No Danger to Others: No Duty to Warn:no Physical Aggression / Violence:No  Access to Firearms a concern: No  Gang Involvement:No   Subjective:  Met with patient via virtual session. He shared that he was doing better with a med change and he has been able to get some things completed at work.  Patient stated he is doing better perspective with his work issues. He is noticing he can chose how he thinks and can choose not to give energy to some negativity which helps how he feels greatly. He went on to share it is exhausting for him but can't think too much about how he is feeling. He used to be stuck but now he can turn his attention to something else.  He was able to share some different situations at work that have helped him to see he knows more about what he is doing then his boss had let on.  Patient went on to explain that he has gotten information that other people are not feeling is comfortable with his boss and the way she handles things but prefers the way he thinks.  Discussed the importance of reminding himself of those truce and to even write them down so that when it surfaces he is able to read the information and affirm himself.  Patient also explained that there are things he feels might help him like playing golf but then he worries that it is too much money.  Encouraged him to think through other options so that he can get his needs met even though he may not be able to have it met exactly the way he thinks that should be.  He was able to realize that some of his parents like  to play at the more expensive golf courses but he could ask that they go to cheaper golf courses or T times that are not as costly and then he can still get to get out and release his stress in an appropriate manner.  He is still doing the breathing, the bilateral brainspolling, and engaging the kids in positive activities and games.  Encouraged patient to feel good about the progress that he is making and to make sure he stays present and does not look too far into the future.  Also discussed the importance of recognizing this to his feelings but not becoming his  feelings.  Discussed how to see that his feelings give him information on how to make different decisions.  Interventions: Cognitive Behavioral Therapy, Dialectical Behavioral Therapy, and Solution-Oriented/Positive Psychology  Diagnosis:   ICD-10-CM   1. Bipolar II disorder (HCC)  F31.81       Plan: Patient is to use grounding exercises and breathing exercises patient is to practice using brain spotting bilateral music as needed.  Patient is to practice CBT filters and using his feelings as information rather than fact.  Patient is to write out the things that they have accomplished over the past years and to read the list as he needs to. Patient is to start seeking volunteer opportunities that may be more for feeling than his current job. Patient is to work on exercising for a few minutes when he is unable to focus and get started on task. He is to take medication as directed and continue with provider Harlene Pepper PMHNP.   Silvano Pacini, [facility]

## 2024-03-19 NOTE — Patient Instructions (Addendum)
 SURGICAL WAITING ROOM VISITATION  Patients having surgery or a procedure may have no more than 2 support people in the waiting area - these visitors may rotate.    Children under the age of 78 must have an adult with them who is not the patient.  Visitors with respiratory illnesses are discouraged from visiting and should remain at home.  If the patient needs to stay at the hospital during part of their recovery, the visitor guidelines for inpatient rooms apply. Pre-op nurse will coordinate an appropriate time for 1 support person to accompany patient in pre-op.  This support person may not rotate.    Please refer to the [facility] website for the visitor guidelines for Inpatients (after your surgery is over and you are in a regular room).    Your procedure is scheduled on: 04/02/24   Report to [facility] Main Entrance    Report to admitting at 5:15 AM   Call this number if you have problems the morning of surgery [phone]   Do not eat food or drink liquids  :After Midnight.          If you have questions, please contact your surgeon's office.   FOLLOW BOWEL PREP AND ANY ADDITIONAL PRE OP INSTRUCTIONS YOU RECEIVED FROM YOUR SURGEON'S OFFICE!!!     Oral Hygiene is also important to reduce your risk of infection.                                    Remember - BRUSH YOUR TEETH THE MORNING OF SURGERY WITH YOUR REGULAR TOOTHPASTE  DENTURES WILL BE REMOVED PRIOR TO SURGERY PLEASE DO NOT APPLY Poly grip OR ADHESIVES!!!   Stop all vitamins and herbal supplements 7 days before surgery.   Take these medicines the morning of surgery with A SIP OF WATER: Tylenol                               You may not have any metal on your body including jewelry, and body piercing             Do not wear lotions, powders, cologne, or deodorant              Men may shave face and neck.   Do not bring valuables to the hospital. [place] IS NOT             RESPONSIBLE   FOR  VALUABLES.   Contacts, glasses, dentures or bridgework may not be worn into surgery.  DO NOT BRING YOUR HOME MEDICATIONS TO THE HOSPITAL. PHARMACY WILL DISPENSE MEDICATIONS LISTED ON YOUR MEDICATION LIST TO YOU DURING YOUR ADMISSION IN THE HOSPITAL!    Patients discharged on the day of surgery will not be allowed to drive home.  Someone NEEDS to stay with you for the first 24 hours after anesthesia.              Please read over the following fact sheets you were given: IF YOU HAVE QUESTIONS ABOUT YOUR PRE-OP INSTRUCTIONS PLEASE CALL [phone] [place].   If you received a COVID test during your pre-op visit  it is requested that you wear a mask when out in public, stay away from anyone that may not be feeling well and notify your surgeon if you develop symptoms. If you test positive for Covid or have been in  contact with anyone that has tested positive in the last 10 days please notify you surgeon.     - Preparing for Surgery Before surgery, you can play an important role.  Because skin is not sterile, your skin needs to be as free of germs as possible.  You can reduce the number of germs on your skin by washing with CHG (chlorahexidine gluconate) soap before surgery.  CHG is an antiseptic cleaner which kills germs and bonds with the skin to continue killing germs even after washing. Please DO NOT use if you have an allergy to CHG or antibacterial soaps.  If your skin becomes reddened/irritated stop using the CHG and inform your nurse when you arrive at Short Stay. Do not shave (including legs and underarms) for at least 48 hours prior to the first CHG shower.  You may shave your face/neck.  Please follow these instructions carefully:  1.  Shower with CHG Soap the night before surgery ONLY (DO NOT USE THE SOAP THE MORNING OF SURGERY).  2.  If you choose to wash your hair, wash your hair first as usual with your normal  shampoo.  3.  After you shampoo, rinse your hair and body  thoroughly to remove the shampoo.                             4.  Use CHG as you would any other liquid soap.  You can apply chg directly to the skin and wash.  Gently with a scrungie or clean washcloth.  5.  Apply the CHG Soap to your body ONLY FROM THE NECK DOWN.   Do   not use on face/ open                           Wound or open sores. Avoid contact with eyes, ears mouth and   genitals (private parts).                       Wash face,  Genitals (private parts) with your normal soap.             6.  Wash thoroughly, paying special attention to the area where your    surgery  will be performed.  7.  Thoroughly rinse your body with warm water from the neck down.  8.  DO NOT shower/wash with your normal soap after using and rinsing off the CHG Soap.                9.  Pat yourself dry with a clean towel.            10.  Wear clean pajamas.            11.  Place clean sheets on your bed the night of your first shower and do not  sleep with pets. Day of Surgery : Do not apply any CHG, lotions/deodorants the morning of surgery.  Please wear clean clothes to the hospital/surgery center.  FAILURE TO FOLLOW THESE INSTRUCTIONS MAY RESULT IN THE CANCELLATION OF YOUR SURGERY  PATIENT SIGNATURE_________________________________  NURSE SIGNATURE__________________________________  ________________________________________________________________________

## 2024-03-19 NOTE — Progress Notes (Addendum)
 Date of COVID positive in last 90 days:  PCP - Bernardino Cone, MD Cardiologist - n/a  Chest x-ray - N/A EKG - N/A Stress Test - N/A ECHO - N/A Cardiac Cath - N/A Pacemaker/ICD device last checked:N/A Spinal Cord Stimulator:N/A  Bowel Prep - N/A  Sleep Study - N/A CPAP -   Fasting Blood Sugar - N/A Checks Blood Sugar _____ times a day  Last dose of GLP1 agonist-  N/A GLP1 instructions:  Do not take after     Last dose of SGLT-2 inhibitors-  N/A SGLT-2 instructions:  Do not take after     Blood Thinner Instructions: N/A Last dose:   Time: Aspirin Instructions:N/A Last Dose:  Activity level: Can go up a flight of stairs and perform activities of daily living without stopping and without symptoms of chest pain or shortness of breath.   Anesthesia review: N/A  Patient denies shortness of breath, fever, cough and chest pain at PAT appointment  Patient verbalized understanding of instructions that were given to them at the PAT appointment. Patient was also instructed that they will need to review over the PAT instructions again at home before surgery.

## 2024-03-20 ENCOUNTER — Encounter (HOSPITAL_COMMUNITY)
Admission: RE | Admit: 2024-03-20 | Discharge: 2024-03-20 | Disposition: A | Discharge status: Home / Self Care | Source: Ambulatory Visit | Attending: General Surgery | Admitting: General Surgery

## 2024-03-20 ENCOUNTER — Encounter (HOSPITAL_COMMUNITY): Payer: Self-pay

## 2024-03-20 ENCOUNTER — Other Ambulatory Visit: Payer: Self-pay

## 2024-03-20 VITALS — BP 126/83 | HR 64 | Temp 98.6°F | Resp 16 | Ht 67.0 in | Wt 223.0 lb

## 2024-03-20 DIAGNOSIS — Z01818 Encounter for other preprocedural examination: Secondary | ICD-10-CM

## 2024-03-20 DIAGNOSIS — Z01812 Encounter for preprocedural laboratory examination: Secondary | ICD-10-CM | POA: Insufficient documentation

## 2024-03-20 HISTORY — DX: Other complications of anesthesia, initial encounter: T88.59XA

## 2024-03-20 HISTORY — DX: Headache, unspecified: R51.9

## 2024-03-20 HISTORY — DX: Bipolar II disorder: F31.81

## 2024-03-20 LAB — CBC
HCT: 44.9 % (ref 39.0–52.0)
Hemoglobin: 15.3 g/dL (ref 13.0–17.0)
MCH: 28.2 pg (ref 26.0–34.0)
MCHC: 34.1 g/dL (ref 30.0–36.0)
MCV: 82.8 fL (ref 80.0–100.0)
Platelets: 292 K/uL (ref 150–400)
RBC: 5.42 MIL/uL (ref 4.22–5.81)
RDW: 12.7 % (ref 11.5–15.5)
WBC: 6.6 K/uL (ref 4.0–10.5)
nRBC: 0 % (ref 0.0–0.2)

## 2024-04-03 ENCOUNTER — Ambulatory Visit: Admitting: Gastroenterology

## 2024-04-03 ENCOUNTER — Encounter: Payer: Self-pay | Admitting: Gastroenterology

## 2024-04-03 VITALS — BP 118/74 | HR 58 | Ht 67.5 in | Wt 225.2 lb

## 2024-04-03 DIAGNOSIS — K610 Anal abscess: Secondary | ICD-10-CM

## 2024-04-03 DIAGNOSIS — K921 Melena: Secondary | ICD-10-CM

## 2024-04-03 DIAGNOSIS — K5900 Constipation, unspecified: Secondary | ICD-10-CM

## 2024-04-03 MED ORDER — NA SULFATE-K SULFATE-MG SULF 17.5-3.13-1.6 GM/177ML PO SOLN: 1.0000 | Freq: Once | ORAL | 0 refills | Status: AC

## 2024-04-03 NOTE — Patient Instructions (Signed)
 You have been scheduled for a colonoscopy. Please follow written instructions given to you at your visit today.   If you use inhalers (even only as needed), please bring them with you on the day of your procedure.  DO NOT TAKE 7 DAYS PRIOR TO TEST- Trulicity (dulaglutide) Ozempic , Wegovy  (semaglutide ) Mounjaro, Zepbound (tirzepatide) Bydureon Bcise (exanatide extended release)  DO NOT TAKE 1 DAY PRIOR TO YOUR TEST Rybelsus  (semaglutide ) Adlyxin (lixisenatide) Victoza (liraglutide) Byetta (exanatide) ______________________________________________________________  _______________________________________________________  If your blood pressure at your visit was 140/90 or greater, please contact your primary care physician to follow up on this.  _______________________________________________________  If you are age 55 or older, your body mass index should be between 23-30. Your Body mass index is 34.76 kg/m. If this is out of the aforementioned range listed, please consider follow up with your Primary Care Provider.  If you are age 66 or younger, your body mass index should be between 19-25. Your Body mass index is 34.76 kg/m. If this is out of the aformentioned range listed, please consider follow up with your Primary Care Provider.   ________________________________________________________  The [facility] providers would like to encourage you to use MYCHART to communicate with providers for non-urgent requests or questions.  Due to long hold times on the telephone, sending your provider a message by [facility] may be a faster and more efficient way to get a response.  Please allow 48 business hours for a response.  Please remember that this is for non-urgent requests.  _______________________________________________________  [facility] is using a team-based approach to care.  Your team is made up of your doctor and two to three APPS. Our APPS (Nurse Practitioners and  Physician Assistants) work with your physician to ensure care continuity for you. They are fully qualified to address your health concerns and develop a treatment plan. They communicate directly with your gastroenterologist to care for you. Seeing the Advanced Practice Practitioners on your physician's team can help you by facilitating care more promptly, often allowing for earlier appointments, access to diagnostic testing, procedures, and other specialty referrals.

## 2024-04-03 NOTE — Progress Notes (Signed)
 Discussed the use of AI scribe software for clinical note transcription with the patient, who gave verbal consent to proceed.  HPI : Mark Fritz is a 38 year old male who presents with recurrent hematochezia, perianal abscesses and constipation issues.  He has a history of recurrent perianal abscesses, first noted in June 2024, which have required treatment with antibiotics. The abscesses have recurred multiple times, with the most recent episode occurring around July 2025, leading to an emergency department visit. The abscesses are described by the patient as hard and not drainable, and he reports that they were treated with antibiotics rather than being drained. He experiences occasional swelling and pain, particularly when sitting for long periods, but no recent drainage or fluid leakage. No recent blood in his stool, which was a symptom noted in 2024. He has experienced fevers and chills associated with the abscesses in the past.  He reports a long-standing issue with flat stools, consistent over the years. He experiences a sensation of incomplete evacuation, leading to frequent bowel movements, approximately three to four times daily. He has tried stool softeners in the past, specifically docusate, but experienced severe side effects, including exacerbation of his bipolar disorder symptoms, which has limited his treatment options for constipation. No significant abdominal pain or persistent diarrhea.  He was seen by Dr. Debby of colorectal surgery and is scheduled for an exam under anesthesia in February 2026 with possible seton placement if a fistula is found.  His family history includes a mother with gastrointestinal symptoms, possibly IBS, but no known history of Crohn's disease or GI cancers.       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     Past Surgical History:  Procedure Laterality Date   BLEPHAROPLASTY Right    BUNIONECTOMY Left 2008   CERVICAL DISC ARTHROPLASTY     DORSAL COMPARTMENT RELEASE Right 01/14/2018   Procedure: RIGHT WRIST RELEASE DORSAL COMPARTMENT (DEQUERVAIN);  Surgeon: Murrell Kuba, MD;  Location: [facility];  Service: Orthopedics;  Laterality: Right;   VASECTOMY     Family History  Problem Relation Age of Onset   Depression Mother    Diabetes Mother    Miscarriages / Stillbirths Mother    Hearing loss Father    Hypertension Father    Hyperlipidemia Father    Obesity Father    Depression Sister    Anxiety disorder Sister    Obesity Sister    Anxiety disorder Sister    Depression Sister    Obesity Sister    Mental illness Maternal Grandmother    Depression Maternal Grandmother    Bipolar disorder Maternal Grandmother    Hypertension Paternal Grandfather    Stroke Paternal Grandfather    Autism Son    Anxiety disorder Son    Tics Son    Anxiety disorder Son    Social History   Tobacco Use   Smoking status: Never   Smokeless tobacco: Never  Vaping Use   Vaping status: Never Used  Substance Use Topics   Alcohol use: No   Drug use: No  Current Outpatient Medications  Medication Sig Dispense Refill   acetaminophen  (TYLENOL ) 325 MG tablet Take 650 mg by mouth every 6 (six) hours as needed for moderate pain (pain score 4-6).     Amphetamine  Sulfate (EVEKEO ) 10 MG TABS Take 1 tablet (10 mg total) by mouth 2 (two) times daily. (Patient not taking: Reported on 03/17/2024) 60 tablet 0   Amphetamine  Sulfate (EVEKEO ) 10 MG TABS Take 1 tablet (10 mg total) by mouth 2 (two) times daily. (Patient not taking: Reported on 03/17/2024) 60 tablet 0   Amphetamine  Sulfate (EVEKEO ) 10 MG TABS Take 1 tablet (10 mg  total) by mouth 2 (two) times daily. (Patient not taking: Reported on 03/17/2024) 60 tablet 0   baclofen  (LIORESAL ) 10 MG tablet Take 1 tablet (10 mg total) by mouth 3 (three) times daily. (Patient not taking: Reported on 03/17/2024) 30 each 0   busPIRone  (BUSPAR ) 15 MG tablet TAKE 1 TABLET TWICE DAILY (Patient not taking: Reported on 03/17/2024) 180 tablet 0   Carboxymethylcellulose Sodium (THERATEARS OP) Place 1 drop into both eyes as needed (dry eyes).     cephALEXin  (KEFLEX ) 500 MG capsule Take 1 capsule (500 mg total) by mouth 3 (three) times daily. (Patient not taking: Reported on 03/17/2024) 30 capsule 0   doxycycline  (VIBRAMYCIN ) 100 MG capsule Take 1 capsule (100 mg total) by mouth 2 (two) times daily. (Patient not taking: Reported on 03/17/2024) 20 capsule 0   hydrocortisone  (ANUSOL -HC) 25 MG suppository Place 1 suppository (25 mg total) rectally 2 (two) times daily. (Patient not taking: Reported on 03/17/2024) 12 suppository 0   hydrOXYzine  (ATARAX ) 10 MG tablet Take 10 mg by mouth at bedtime as needed (sleep).     lamoTRIgine (LAMICTAL) 100 MG tablet Take 100 mg by mouth daily.     lithium  carbonate 300 MG capsule Take 600 mg by mouth at bedtime.     sodium chloride  (OCEAN) 0.65 % SOLN nasal spray Place 1 spray into both nostrils as needed for congestion.     Vilazodone  HCl 20 MG TABS Take 1 tablet (20 mg total) by mouth daily with breakfast. (Patient not taking: Reported on 03/17/2024) 90 tablet 1   No current facility-administered medications for this visit.   Allergies  Allergen Reactions   Bupropion  Other (See Comments)    Anger/ Enraged behavior     Review of Systems: All systems reviewed and negative except where noted in HPI.    No results found.  Physical Exam: BP 118/74 (BP Location: Left Arm, Patient Position: Sitting, Cuff Size: Large)   Pulse (!) 58   Ht 5' 7.5 (1.715 m)   Wt 225 lb 4 oz (102.2 kg)   BMI 34.76 kg/m  Constitutional: Pleasant,well-developed,  Caucasian male in no acute distress. HEENT: Normocephalic and atraumatic. Conjunctivae are normal. No scleral icterus. Rectal:  Deferred until colonoscopy Neurological: Alert and oriented to person place and time. Skin: Skin is warm and dry. No rashes noted. Psychiatric: Normal mood and affect. Behavior is normal.  CBC    Component Value Date/Time   WBC 6.6 03/20/2024 0849   RBC 5.42 03/20/2024 0849   HGB 15.3 03/20/2024 0849   HCT 44.9 03/20/2024 0849   PLT 292 03/20/2024 0849   MCV 82.8 03/20/2024 0849   MCH 28.2 03/20/2024 0849   MCHC 34.1 03/20/2024 0849   RDW 12.7 03/20/2024 0849   LYMPHSABS 2.5 03/30/2022 0935   MONOABS 0.5 03/30/2022 0935   EOSABS 0.1 03/30/2022 0935   BASOSABS 0.0 03/30/2022 0935  CMP     Component Value Date/Time   NA 140 11/23/2023 1954   K 3.6 11/23/2023 1954   CL 104 11/23/2023 1954   CO2 24 11/23/2023 1954   GLUCOSE 94 11/23/2023 1954   BUN 14 11/23/2023 1954   CREATININE 1.20 11/23/2023 1954   CALCIUM 9.8 11/23/2023 1954   PROT 7.9 03/30/2022 0935   ALBUMIN 5.1 03/30/2022 0935   AST 26 03/30/2022 0935   ALT 43 03/30/2022 0935   ALKPHOS 60 03/30/2022 0935   BILITOT 0.7 03/30/2022 0935   GFRNONAA >60 11/23/2023 1954       Latest Ref Rng & Units 03/20/2024    8:49 AM 11/23/2023    7:54 PM 03/30/2022    9:35 AM  CBC EXTENDED  WBC 4.0 - 10.5 K/uL 6.6  7.6  7.7   RBC 4.22 - 5.81 MIL/uL 5.42  5.11  5.49   Hemoglobin 13.0 - 17.0 g/dL 84.6  85.3  84.2   HCT 39.0 - 52.0 % 44.9  41.9  46.0   Platelets 150 - 400 K/uL 292  258  269.0   NEUT# 1.4 - 7.7 K/uL   4.6   Lymph# 0.7 - 4.0 K/uL   2.5       ASSESSMENT AND PLAN:  38 year old male with recurrent hematochezia, perianal abscess and constipation.  Low suspicion for Crohn's disease or mass lesion, but these should be excluded with a colonoscopy.  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 Differential includes Crohn's  disease. - Scheduled colonoscopy to evaluate for Crohn's disease. - Proceed with scheduled surgery in February to examine for fistula.  Constipation with altered stool caliber Chronic constipation with flat stools. Previous docusate use affected bipolar medication. - Recommended daily fiber supplement such as Metamucil or Benefiber to improve stool bulk and evacuation.  Rectal bleeding Intermittent rectal bleeding. Differential includes hemorrhoids, mass lesion/polyp, anal fissure, or Crohn's disease. - Scheduled colonoscopy to evaluate for bleeding sources and rule out Crohn's disease.  Recording duration: 11 minutes     The details, risks (including bleeding, perforation, infection, missed lesions, medication reactions and possible hospitalization or surgery if complications occur), benefits, and alternatives to colonoscopy with possible biopsy and possible polypectomy were discussed with the patient and he consents to proceed.   I spent a total of 30 minutes reviewing the patient's medical record, interviewing and examining the patient, discussing her diagnosis and management of her condition going forward, and documenting in the medical record  Brick Ketcher E. Stacia, MD [facility]   Jesus Bernardino MATSU, MD

## 2024-04-07 ENCOUNTER — Encounter: Admitting: Gastroenterology

## 2024-04-10 ENCOUNTER — Ambulatory Visit: Admitting: Gastroenterology

## 2024-04-10 ENCOUNTER — Encounter: Payer: Self-pay | Admitting: Gastroenterology

## 2024-04-10 VITALS — BP 125/79 | HR 73 | Temp 97.9°F | Resp 10 | Ht 67.5 in | Wt 225.0 lb

## 2024-04-10 DIAGNOSIS — K610 Anal abscess: Secondary | ICD-10-CM

## 2024-04-10 DIAGNOSIS — K635 Polyp of colon: Secondary | ICD-10-CM

## 2024-04-10 DIAGNOSIS — K621 Rectal polyp: Secondary | ICD-10-CM

## 2024-04-10 DIAGNOSIS — K623 Rectal prolapse: Secondary | ICD-10-CM

## 2024-04-10 DIAGNOSIS — K5900 Constipation, unspecified: Secondary | ICD-10-CM

## 2024-04-10 DIAGNOSIS — K625 Hemorrhage of anus and rectum: Secondary | ICD-10-CM

## 2024-04-10 DIAGNOSIS — K921 Melena: Secondary | ICD-10-CM

## 2024-04-10 DIAGNOSIS — D129 Benign neoplasm of anus and anal canal: Secondary | ICD-10-CM

## 2024-04-10 DIAGNOSIS — D123 Benign neoplasm of transverse colon: Secondary | ICD-10-CM

## 2024-04-10 MED ORDER — SODIUM CHLORIDE 0.9 % IV SOLN: 500.0000 mL | Freq: Once | INTRAVENOUS | Status: DC

## 2024-04-10 NOTE — Progress Notes (Signed)
 Called to room to assist during endoscopic procedure.  Patient ID and intended procedure confirmed with present staff. Received instructions for my participation in the procedure from the performing physician.

## 2024-04-10 NOTE — Progress Notes (Signed)
 Pt's states no medical or surgical changes since previsit or office visit.

## 2024-04-10 NOTE — Op Note (Addendum)
  [facility] Patient Name: Mark Fritz Procedure Date: 04/10/2024 8:57 AM MRN: [id number] Endoscopist: Glendia E. Stacia , MD, [id number] Age: 38 Referring MD:  Date of Birth: 1986/04/16 Gender: Male Account #: [id number] Procedure:                Colonoscopy Indications:              Hematochezia, recurrent perianal abscess, exclusion                            of Crohn's disease Medicines:                Monitored Anesthesia Care Procedure:                Pre-Anesthesia Assessment:                           - Prior to the procedure, a History and Physical                            was performed, and patient medications and                            allergies were reviewed. The patient's tolerance of                            previous anesthesia was also reviewed. The risks                            and benefits of the procedure and the sedation                            options and risks were discussed with the patient.                            All questions were answered, and informed consent                            was obtained. Prior Anticoagulants: The patient has                            taken no anticoagulant or antiplatelet agents. ASA                            Grade Assessment: II - A patient with mild systemic                            disease. After reviewing the risks and benefits,                            the patient was deemed in satisfactory condition to                            undergo the procedure.  After obtaining informed consent, the colonoscope                            was passed under direct vision. Throughout the                            procedure, the patient's blood pressure, pulse, and                            oxygen saturations were monitored continuously. The                            CF [id number] #[id number] was introduced through the anus                            and advanced to the the terminal  ileum, with                            identification of the appendiceal orifice and IC                            valve. The colonoscopy was performed without                            difficulty. The patient tolerated the procedure                            well. The quality of the bowel preparation was                            good. The terminal ileum, ileocecal valve,                            appendiceal orifice, and rectum were photographed.                            The bowel preparation used was SUPREP via split                            dose instruction. Scope In: 9:06:58 AM Scope Out: 9:23:24 AM Scope Withdrawal Time: 0 hours 11 minutes 46 seconds  Total Procedure Duration: 0 hours 16 minutes 26 seconds  Findings:                 The perianal and digital rectal examinations were                            normal. Pertinent negatives include normal                            sphincter tone and no palpable rectal lesions.                           A 4 mm polyp was found in the transverse colon. The  polyp was flat. The polyp was removed with a cold                            snare. Resection and retrieval were complete.                            Estimated blood loss was minimal.                           A 1 mm polyp was found in the distal rectum. The                            polyp was sessile. The polyp was removed with a                            cold biopsy forceps (snare was initially used, but                            repeatedly slid over polyp). Resection and                            retrieval were complete. Estimated blood loss was                            minimal.                           The exam was otherwise normal throughout the                            examined colon.                           The terminal ileum appeared normal.                           The retroflexed view of the distal rectum and anal                             verge was normal and showed no anal or rectal                            abnormalities. Complications:            No immediate complications. Estimated Blood Loss:     Estimated blood loss was minimal. Impression:               - One 4 mm polyp in the transverse colon, removed                            with a cold snare. Resected and retrieved.                           - One 1 mm polyp in the distal rectum, removed with  a cold biopsy forceps. Resected and retrieved.                           - The examined portion of the ileum was normal.                           - The distal rectum and anal verge are normal on                            retroflexion view.                           - No evidence of Crohn's disease. No evidence of                            perianal fistula or fistulous opening.                           - The patient's bright red blood per rectum can                            attributed to internal hemorrhoids. Recommendation:           - Patient has a contact number available for                            emergencies. The signs and symptoms of potential                            delayed complications were discussed with the                            patient. Return to normal activities tomorrow.                            Written discharge instructions were provided to the                            patient.                           - Resume previous diet.                           - Continue present medications.                           - Await pathology results.                           - Repeat colonoscopy (date not yet determined) for                            surveillance based on pathology results.                           -  Follow up with Dr. Debby for further management                            of recurrent perianal abscess [name] E. Stacia, MD 04/10/2024 9:32:16 AM This report has been signed  electronically.

## 2024-04-10 NOTE — Patient Instructions (Addendum)
 Resume previous diet.  Continue present medications. Await pathology results.   Repeat colonoscopy (date not yet determined) for surveillance based on pathology results.   Follow up with Dr. Debby for further management of recurrent perianal abscess.   YOU HAD AN ENDOSCOPIC PROCEDURE TODAY AT THE [facility]:   Refer to the procedure report that was given to you for any specific questions about what was found during the examination.  If the procedure report does not answer your questions, please call your gastroenterologist to clarify.  If you requested that your care partner not be given the details of your procedure findings, then the procedure report has been included in a sealed envelope for you to review at your convenience later.  YOU SHOULD EXPECT: Some feelings of bloating in the abdomen. Passage of more gas than usual.  Walking can help get rid of the air that was put into your GI tract during the procedure and reduce the bloating. If you had a lower endoscopy (such as a colonoscopy or flexible sigmoidoscopy) you may notice spotting of blood in your stool or on the toilet paper. If you underwent a bowel prep for your procedure, you may not have a normal bowel movement for a few days.  Please Note:  You might notice some irritation and congestion in your nose or some drainage.  This is from the oxygen used during your procedure.  There is no need for concern and it should clear up in a day or so.  SYMPTOMS TO REPORT IMMEDIATELY:  Following lower endoscopy (colonoscopy or flexible sigmoidoscopy):  Excessive amounts of blood in the stool  Significant tenderness or worsening of abdominal pains  Swelling of the abdomen that is new, acute  Fever of 100F or higher  For urgent or emergent issues, a gastroenterologist can be reached at any hour by calling [phone]. Do not use MyChart messaging for urgent concerns.    DIET:  We do recommend a small meal at first, but  then you may proceed to your regular diet.  Drink plenty of fluids but you should avoid alcoholic beverages for 24 hours.  ACTIVITY:  You should plan to take it easy for the rest of today and you should NOT DRIVE or use heavy machinery until tomorrow (because of the sedation medicines used during the test).    FOLLOW UP: Our staff will call the number listed on your records the next business day following your procedure.  We will call around 7:15- 8:00 am to check on you and address any questions or concerns that you may have regarding the information given to you following your procedure. If we do not reach you, we will leave a message.     If any biopsies were taken you will be contacted by phone or by letter within the next 1-3 weeks.  Please call us  at [phone] if you have not heard about the biopsies in 3 weeks.    SIGNATURES/CONFIDENTIALITY: You and/or your care partner have signed paperwork which will be entered into your electronic medical record.  These signatures attest to the fact that that the information above on your After Visit Summary has been reviewed and is understood.  Full responsibility of the confidentiality of this discharge information lies with you and/or your care-partner.

## 2024-04-10 NOTE — Progress Notes (Signed)
 History and Physical Interval Note:  04/10/2024 8:56 AM  Mark Fritz  has presented today for endoscopic procedure(s), with the diagnosis of  Encounter Diagnoses  Name Primary?   Perianal abscess Yes   Hematochezia    Constipation, unspecified constipation type    Rectal bleeding   .  The various methods of evaluation and treatment have been discussed with the patient and/or family. After consideration of risks, benefits and other options for treatment, the patient has consented to  the endoscopic procedure(s).   The patient's history has been reviewed, patient examined, no change in status, stable for endoscopic procedure(s).  I have reviewed the patient's chart and labs.  Questions were answered to the patient's satisfaction.     Mark Shipper E. Stacia, MD [facility]

## 2024-04-10 NOTE — Progress Notes (Signed)
 Vss nad trans to pacu

## 2024-04-13 ENCOUNTER — Telehealth: Payer: Self-pay

## 2024-04-13 NOTE — Telephone Encounter (Signed)
 Left message on follow up call.

## 2024-04-14 LAB — SURGICAL PATHOLOGY

## 2024-04-17 ENCOUNTER — Ambulatory Visit: Payer: Self-pay | Admitting: Gastroenterology

## 2024-04-17 NOTE — Progress Notes (Signed)
 [place],  One polyp which I removed during your recent procedure was proven to be completely benign but is considered a pre-cancerous polyp that MAY have grown into cancer if it had not been removed.  The other polyp was not precancerous.  Studies shows that at least 20% of women over age 40 and 30% of men over age 32 have pre-cancerous polyps.  Based on current nationally recognized surveillance guidelines, I recommend that you have a repeat colonoscopy in 7 years.

## 2024-06-05 ENCOUNTER — Inpatient Hospital Stay: Admit: 2024-06-05 | Source: Ambulatory Visit | Admitting: Student in an Organized Health Care Education/Training Program

## 2024-06-05 ENCOUNTER — Other Ambulatory Visit: Payer: Self-pay

## 2024-06-05 ENCOUNTER — Inpatient Hospital Stay: Admit: 2024-06-05 | Payer: Self-pay | Source: Other Acute Inpatient Hospital | Admitting: Psychiatry

## 2024-06-05 ENCOUNTER — Ambulatory Visit (HOSPITAL_COMMUNITY)
Admission: EM | Admit: 2024-06-05 | Discharge: 2024-06-05 | Disposition: A | Discharge status: Other Acute Inpatient Hospital | Source: Home / Self Care

## 2024-06-05 ENCOUNTER — Encounter: Payer: Self-pay | Admitting: Student in an Organized Health Care Education/Training Program

## 2024-06-05 DIAGNOSIS — F333 Major depressive disorder, recurrent, severe with psychotic symptoms: Principal | ICD-10-CM | POA: Diagnosis present

## 2024-06-05 DIAGNOSIS — F323 Major depressive disorder, single episode, severe with psychotic features: Secondary | ICD-10-CM

## 2024-06-05 LAB — CBC WITH DIFFERENTIAL/PLATELET
Abs Immature Granulocytes: 0.03 10*3/uL (ref 0.00–0.07)
Basophils Absolute: 0 10*3/uL (ref 0.0–0.1)
Basophils Relative: 1 %
Eosinophils Absolute: 0.2 10*3/uL (ref 0.0–0.5)
Eosinophils Relative: 3 %
HCT: 44.8 % (ref 39.0–52.0)
Hemoglobin: 15.2 g/dL (ref 13.0–17.0)
Immature Granulocytes: 1 %
Lymphocytes Relative: 39 %
Lymphs Abs: 2.6 10*3/uL (ref 0.7–4.0)
MCH: 28.3 pg (ref 26.0–34.0)
MCHC: 33.9 g/dL (ref 30.0–36.0)
MCV: 83.4 fL (ref 80.0–100.0)
Monocytes Absolute: 0.5 10*3/uL (ref 0.1–1.0)
Monocytes Relative: 8 %
Neutro Abs: 3.2 10*3/uL (ref 1.7–7.7)
Neutrophils Relative %: 48 %
Platelets: 314 10*3/uL (ref 150–400)
RBC: 5.37 MIL/uL (ref 4.22–5.81)
RDW: 12.7 % (ref 11.5–15.5)
WBC: 6.6 10*3/uL (ref 4.0–10.5)
nRBC: 0 % (ref 0.0–0.2)

## 2024-06-05 LAB — COMPREHENSIVE METABOLIC PANEL WITH GFR
ALT: 56 U/L — ABNORMAL HIGH (ref 0–44)
AST: 25 U/L (ref 15–41)
Albumin: 4.8 g/dL (ref 3.5–5.0)
Alkaline Phosphatase: 56 U/L (ref 38–126)
Anion gap: 10 (ref 5–15)
BUN: 15 mg/dL (ref 6–20)
CO2: 26 mmol/L (ref 22–32)
Calcium: 9.6 mg/dL (ref 8.9–10.3)
Chloride: 103 mmol/L (ref 98–111)
Creatinine, Ser: 1.14 mg/dL (ref 0.61–1.24)
GFR, Estimated: >60 mL/min
Glucose, Bld: 87 mg/dL (ref 70–99)
Potassium: 4 mmol/L (ref 3.5–5.1)
Sodium: 138 mmol/L (ref 135–145)
Total Bilirubin: 0.4 mg/dL (ref 0.0–1.2)
Total Protein: 7.6 g/dL (ref 6.5–8.1)

## 2024-06-05 LAB — POCT URINE DRUG SCREEN - MANUAL ENTRY (I-SCREEN)
POC Amphetamine UR: NOT DETECTED
POC Buprenorphine (BUP): NOT DETECTED
POC Cocaine UR: NOT DETECTED
POC Marijuana UR: NOT DETECTED
POC Methadone UR: NOT DETECTED
POC Methamphetamine UR: NOT DETECTED
POC Morphine: NOT DETECTED
POC Oxazepam (BZO): NOT DETECTED
POC Oxycodone UR: NOT DETECTED
POC Secobarbital (BAR): NOT DETECTED

## 2024-06-05 LAB — URINALYSIS, ROUTINE W REFLEX MICROSCOPIC
Bilirubin Urine: NEGATIVE
Glucose, UA: NEGATIVE mg/dL
Hgb urine dipstick: NEGATIVE
Ketones, ur: NEGATIVE mg/dL
Leukocytes,Ua: NEGATIVE
Nitrite: NEGATIVE
Protein, ur: NEGATIVE mg/dL
Specific Gravity, Urine: 1.011 (ref 1.005–1.030)
pH: 6 (ref 5.0–8.0)

## 2024-06-05 LAB — LIPID PANEL
Cholesterol: 197 mg/dL (ref 0–200)
HDL: 47 mg/dL
LDL Cholesterol: 101 mg/dL — ABNORMAL HIGH (ref 0–99)
Total CHOL/HDL Ratio: 4.2 ratio
Triglycerides: 249 mg/dL — ABNORMAL HIGH
VLDL: 50 mg/dL — ABNORMAL HIGH (ref 0–40)

## 2024-06-05 LAB — VITAMIN B12: Vitamin B-12: 552 pg/mL (ref 180–914)

## 2024-06-05 LAB — LITHIUM LEVEL: Lithium Lvl: 0.45 mmol/L — ABNORMAL LOW (ref 0.60–1.20)

## 2024-06-05 LAB — HEMOGLOBIN A1C
Hgb A1c MFr Bld: 5.5 % (ref 4.8–5.6)
Mean Plasma Glucose: 111.15 mg/dL

## 2024-06-05 LAB — TSH: TSH: 2.99 u[IU]/mL (ref 0.350–4.500)

## 2024-06-05 MED ORDER — LITHIUM CARBONATE 300 MG PO CAPS: 600.0000 mg | ORAL_CAPSULE | Freq: Every day | ORAL | Status: DC

## 2024-06-05 MED ORDER — ALUM & MAG HYDROXIDE-SIMETH 200-200-20 MG/5ML PO SUSP: 30.0000 mL | ORAL | Status: DC | PRN

## 2024-06-05 MED ORDER — HALOPERIDOL 5 MG PO TABS: 5.0000 mg | ORAL_TABLET | Freq: Three times a day (TID) | ORAL | Status: AC | PRN

## 2024-06-05 MED ORDER — MAGNESIUM HYDROXIDE 400 MG/5ML PO SUSP: 30.0000 mL | Freq: Every day | ORAL | Status: AC | PRN

## 2024-06-05 MED ORDER — ACETAMINOPHEN 325 MG PO TABS: 650.0000 mg | ORAL_TABLET | Freq: Four times a day (QID) | ORAL | Status: AC | PRN

## 2024-06-05 MED ORDER — HALOPERIDOL 5 MG PO TABS: 5.0000 mg | ORAL_TABLET | Freq: Three times a day (TID) | ORAL | Status: DC | PRN

## 2024-06-05 MED ORDER — HALOPERIDOL LACTATE 5 MG/ML IJ SOLN: 5.0000 mg | Freq: Three times a day (TID) | INTRAMUSCULAR | Status: AC | PRN

## 2024-06-05 MED ORDER — ALUM & MAG HYDROXIDE-SIMETH 200-200-20 MG/5ML PO SUSP: 30.0000 mL | ORAL | Status: AC | PRN

## 2024-06-05 MED ORDER — SALINE SPRAY 0.65 % NA SOLN: 1.0000 | NASAL | Status: DC | PRN

## 2024-06-05 MED ORDER — LAMOTRIGINE 100 MG PO TABS: 100.0000 mg | ORAL_TABLET | Freq: Every day | ORAL | Status: DC

## 2024-06-05 MED ORDER — HYDROXYZINE HCL 10 MG PO TABS: 10.0000 mg | ORAL_TABLET | Freq: Every evening | ORAL | Status: DC | PRN

## 2024-06-05 MED ORDER — LORAZEPAM 2 MG/ML IJ SOLN: 2.0000 mg | Freq: Three times a day (TID) | INTRAMUSCULAR | Status: AC | PRN

## 2024-06-05 MED ORDER — HALOPERIDOL LACTATE 5 MG/ML IJ SOLN: 5.0000 mg | Freq: Three times a day (TID) | INTRAMUSCULAR | Status: DC | PRN

## 2024-06-05 MED ORDER — DIPHENHYDRAMINE HCL 50 MG/ML IJ SOLN: 50.0000 mg | Freq: Three times a day (TID) | INTRAMUSCULAR | Status: DC | PRN

## 2024-06-05 MED ORDER — DIPHENHYDRAMINE HCL 50 MG/ML IJ SOLN: 50.0000 mg | Freq: Three times a day (TID) | INTRAMUSCULAR | Status: AC | PRN

## 2024-06-05 MED ORDER — FLUOXETINE HCL 10 MG PO CAPS: 10.0000 mg | ORAL_CAPSULE | Freq: Every day | ORAL | 2 refills | Status: AC

## 2024-06-05 MED ORDER — LITHIUM CARBONATE 300 MG PO CAPS
900.0000 mg | ORAL_CAPSULE | Freq: Every day | ORAL | Status: AC
  Administered 2024-06-05: 900 mg via ORAL
  Filled 2024-06-05: qty 3

## 2024-06-05 MED ORDER — LAMOTRIGINE 100 MG PO TABS
100.0000 mg | ORAL_TABLET | Freq: Every day | ORAL | Status: AC
  Administered 2024-06-05: 100 mg via ORAL
  Filled 2024-06-05: qty 1

## 2024-06-05 MED ORDER — DIPHENHYDRAMINE HCL 25 MG PO CAPS: 50.0000 mg | ORAL_CAPSULE | Freq: Three times a day (TID) | ORAL | Status: AC | PRN

## 2024-06-05 MED ORDER — ACETAMINOPHEN 325 MG PO TABS: 650.0000 mg | ORAL_TABLET | Freq: Four times a day (QID) | ORAL | Status: DC | PRN

## 2024-06-05 MED ORDER — LITHIUM CARBONATE 300 MG PO CAPS: 900.0000 mg | ORAL_CAPSULE | Freq: Every day | ORAL | Status: DC

## 2024-06-05 MED ORDER — HALOPERIDOL LACTATE 5 MG/ML IJ SOLN: 10.0000 mg | Freq: Three times a day (TID) | INTRAMUSCULAR | Status: AC | PRN

## 2024-06-05 MED ORDER — LORAZEPAM 2 MG/ML IJ SOLN: 2.0000 mg | Freq: Three times a day (TID) | INTRAMUSCULAR | Status: DC | PRN

## 2024-06-05 MED ORDER — DIPHENHYDRAMINE HCL 50 MG PO CAPS: 50.0000 mg | ORAL_CAPSULE | Freq: Three times a day (TID) | ORAL | Status: DC | PRN

## 2024-06-05 MED ORDER — HALOPERIDOL LACTATE 5 MG/ML IJ SOLN: 10.0000 mg | Freq: Three times a day (TID) | INTRAMUSCULAR | Status: DC | PRN

## 2024-06-05 MED ORDER — SALINE SPRAY 0.65 % NA SOLN: 1.0000 | NASAL | Status: AC | PRN

## 2024-06-05 MED ORDER — HYDROXYZINE HCL 10 MG PO TABS
10.0000 mg | ORAL_TABLET | Freq: Every evening | ORAL | Status: AC | PRN
  Administered 2024-06-05: 10 mg via ORAL
  Filled 2024-06-05: qty 1

## 2024-06-05 MED ORDER — MAGNESIUM HYDROXIDE 400 MG/5ML PO SUSP: 30.0000 mL | Freq: Every day | ORAL | Status: DC | PRN

## 2024-06-05 NOTE — Progress Notes (Signed)
 Mr.   06/05/24 1841  Psych Admission Type (Psych Patients Only)  Admission Status Voluntary  Psychosocial Assessment  Patient Complaints Anxiety;Agitation;Crying spells;Irritability;Worrying  Eye Contact Fair  Facial Expression Angry;Anxious;Worried  Affect Angry;Anxious;Irritable  Speech Aggressive;Loud  Interaction Assertive;Demanding  Motor Activity Pacing  Appearance/Hygiene In scrubs  Behavior Characteristics Agressive verbally;Agitated;Anxious;Irritable  Mood Anxious;Angry;Irritable  Aggressive Behavior  Effect No apparent injury  Thought Process  Coherency WDL  Content Blaming others  Delusions None reported or observed  Perception WDL  Hallucination None reported or observed  Judgment Impaired  Confusion WDL  Danger to Self  Current suicidal ideation? Passive  Self-Injurious Behavior Some self-injurious ideation observed or expressed.  No lethal plan expressed   Agreement Not to Harm Self Yes  Description of Agreement verbal  Danger to Others  Danger to Others None reported or observed   Abraham is an adult male with worsening suicide thoughts. He claims that suicide ideas have occurred intermittently for around ten years, with a major increase in the last few months. Approximately one week ago, the patient had an acute suicidal episode in which he walked to a bridge near his home with the intention of jumping into a frigid lake and swimming as far as possible. He describes being startled by a bird, which interrupted his attempt and forced him to seek emergency care. On arriving at the unit, the patient became upset and verbally hostile, claiming he needed to leave since he had been duped into coming here. He added that no one informed him that he would have to stay for 72 hours before being discharged. This nurse attempted to de-escalate the patient by acknowledging that he needed help with his mood and that he was here to obtain it. Patient stormed out of the assessment and  refused to sign the necessary documents. He requested a copy of his voluntary admittance paper, which was sent to him. After speaking with his wife over the phone, the patient remarked, I will sue this place and you, the nurse, if you do not get me out of here. 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 If you don't get me out of here tonight, everyone will have a bad night.

## 2024-06-05 NOTE — Progress Notes (Signed)
" °   06/05/24 0734  [facility] Triage Screening (Walk-ins at [facility] only)  How Did You Hear About Us ? Family/Friend  What Is the Reason for Your Visit/Call Today? Mark Fritz is a 39 year old male presenting to [facility] accompanied by his wife. Pt states he has been having ongoing thoughts of, wanting to die. Pt states that he had a plan to end his life a week ago by drowning in his lake in the freezing cold. Pt reports inconsistent moods towards his family that he is trying to act like he should burn bridges with loved ones. Pt states he has not had any past suicide attmepts. Pt also denies self harming at this time. Pt denies substance use, Hi, NSSIB and AVH. Pt mentions he has a Bipolar 2 diagnosis. Pt states he is taking Lithium  and Lamictal  as prescribed. Pt states that a lot of his suicidal thoughts come from stress at work and home life with his wife. He states, Sometimes I feel like nothing. Pt states he is currently looking for a therapist at this time.  How Long Has This Been Causing You Problems? 1 wk - 1 month  Have You Recently Had Any Thoughts About Hurting Yourself? Yes  How long ago did you have thoughts about hurting yourself? today  Are You Planning to Commit Suicide/Harm Yourself At This time? No  Have you Recently Had Thoughts About Hurting Someone [name]? No  Are You Planning To Harm Someone At This Time? No  Physical Abuse Denies  Verbal Abuse Denies  Sexual Abuse Denies  Exploitation of patient/patient's resources Denies  Self-Neglect Denies  Possible abuse reported to: Other (Comment)  Are you currently experiencing any auditory, visual or other hallucinations? No  Have You Used Any Alcohol or Drugs in the Past 24 Hours? No  Do you have any current medical co-morbidities that require immediate attention? No  What Do You Feel Would Help You the Most Today? Stress Management;Treatment for Depression or other mood problem  If access to [facility] was not available, would you have sought  care in the Emergency Department? No  Determination of Need Urgent (48 hours)  Options For Referral Intensive Outpatient Therapy  Determination of Need filed? Yes    "

## 2024-06-05 NOTE — ED Provider Notes (Signed)
 [facility] Continuous Assessment Admission H&P  Date: 06/05/24 Patient Name: Mark Fritz MRN: [id number] Chief Complaint: Frequent suicidal ideation with plans to drown at a freezing cold lake.  Diagnoses: Bipolar 2 disorder, depressed Final diagnoses:  None    HPI: Mark Fritz is a 39 year old Caucasian male with prior psychiatric diagnoses significant for depression, ADHD, bipolar 2 disorder, who presents accompanied by his spouse to [facility] behavioral health urgent care for worsening depression and frequent suicidal thoughts with plan to jump over the bridge to a freezing [place] lake near his house in the context of psychosocial stressors of work, family life, and not 'having value in life anymore'.  Patient seen and examined face-to-face sitting up in a chair in the assessment room.  Chart reviewed and findings shared with the treatment team and consult with attending psychiatrist.  During this evaluation, patient reports, I have been struggling with suicidal thoughts, not that I want to die but how to do it.  Recently when it was so pulled I work from my [place], however I got interrupted and did not do it.  My major stressors were pressures, fear of losing my job, and not being kind enough as I am supposed to.  I have never been so close to committed suicide before.  I sent text messages to my close family members to try to burn bridges so that they would not miss me if I was to die.  This week I was not able to focus on my work, my boss observed did and address it immediately.  He was right because my performance was not up to par.  Patient denies drug use, alcohol use, smoking, marijuana use. 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  I am being followed by a therapist at the [facility] psychiatry, however I feel that that does not work out well for me.  I also have a psychiatrist at the [facility] who manages my psychotropic medications for me.   Currently I am on lithium , Lamictal , and hydroxyzine .  He reports my goal is to get treated and I am interested in intensive inpatient or outpatient program to regain my mind and thought back. Patient is domiciled in his house with his wife, and 3 children ages 39, 59, and 71.  Patient reports symptoms of depression as not having felt joy for a long time, sadness, crying, hopelessness, worthlessness, poor focus, isolation, and questioning why he got married, why does his family need to depend on him, why does he feel depressed, and having no value in life.  Endorses anxiety symptoms of being tense with elevated vital signs.  Reports psychotic symptoms to include auditory hallucination hearing people calling his name while going to bed, thought racing, however denies medical hallucinations, paranoia, or ideas of reference.  He denies symptoms of PTSD, OCD, history of abuse, or mania.  Objective: Patient presents alert, cooperative, sad, and oriented to person, time, place, and situation.  Reports mood is labile with constricted affect and noted to be crying throughout this evaluation.  Speech is clear, coherent with normal volume and pattern.  Objectively not preoccupied with internal or external stimuli.  Continues to endorse active suicidal ideation with racing thoughts.  Denies HI, or AVH currently.Patient does not meet the criteria for outpatient admission for discharge at this time and thus is recommended for inpatient psychiatric treatment  evaluation and treatment.  Total Time spent with patient: 45 minutes  Musculoskeletal  Strength & Muscle Tone: within normal limits Gait & Station: normal Patient leans: N/A  Psychiatric Specialty Exam  Presentation General Appearance: Appropriate for Environment; Casual  Eye Contact:Fair  Speech:Clear and Coherent  Speech Volume:Normal  Handedness:Right   Mood and Affect  Mood:Anxious; Depressed  Affect:Congruent; Depressed   Thought Process   Thought Processes:Coherent; Goal Directed  Descriptions of Associations:Intact  Orientation:Full (Time, Place and Person)  Thought Content:Logical  Diagnosis of Schizophrenia or Schizoaffective disorder in past: No data recorded  Hallucinations:Hallucinations: None  Ideas of Reference:None  Suicidal Thoughts:Suicidal Thoughts: Yes, Active SI Active Intent and/or Plan: Without Intent; Without Plan; Without Means to Carry Out; Without Access to Means  Homicidal Thoughts:Homicidal Thoughts: No   Sensorium  Memory:Immediate Fair; Recent Fair  Judgment:Fair  Insight:Fair   Executive Functions  Concentration:Good  Attention Span:Good  Recall:Good  Fund of Knowledge:Good  Language:Fair   Psychomotor Activity  Psychomotor Activity:Psychomotor Activity: Normal   Assets  Assets:Communication Skills; Desire for Improvement; Housing; Physical Health; Resilience; Social Support   Sleep  Sleep:Sleep: Good Number of Hours of Sleep: 7   Nutritional Assessment (For OBS and FBC admissions only) Has the patient had a weight loss or gain of 10 pounds or more in the last 3 months?: No Has the patient had a decrease in food intake/or appetite?: No Does the patient have dental problems?: No Does the patient have eating habits or behaviors that may be indicators of an eating disorder including binging or inducing vomiting?: No Has the patient recently lost weight without trying?: 0 Has the patient been eating poorly because of a decreased appetite?: 0 Malnutrition Screening Tool Score: 0    Physical Exam Vitals and nursing note reviewed.  Constitutional:      Appearance: He is obese.  HENT:     Head: Normocephalic.     Right Ear: External ear normal.     Left Ear: External ear normal.     Nose: Nose normal.     Mouth/Throat:     Mouth: Mucous membranes are moist.     Pharynx: Oropharynx is clear.  Eyes:     Extraocular Movements: Extraocular movements intact.   Cardiovascular:     Rate and Rhythm: Normal rate.     Pulses: Normal pulses.  Pulmonary:     Effort: Pulmonary effort is normal.  Musculoskeletal:        General: Normal range of motion.     Cervical back: Normal range of motion.  Skin:    General: Skin is dry.  Neurological:     General: No focal deficit present.     Mental Status: He is alert and oriented to person, place, and time.  Psychiatric:        Behavior: Behavior normal.        Thought Content: Thought content normal.     Comments: Mood is labile and crying throughout this assessment    Review of Systems  Constitutional:  Negative for chills and fever.  HENT:  Negative for sore throat.   Eyes:  Negative for blurred vision.  Respiratory:  Negative for cough, sputum production, shortness of breath and wheezing.   Cardiovascular:  Negative for chest pain and palpitations.  Gastrointestinal:  Negative for abdominal pain, constipation, diarrhea, heartburn, nausea and vomiting.  Genitourinary:  Negative for dysuria.  Musculoskeletal:  Negative for falls.  Skin:  Negative for rash.  Neurological:  Negative for dizziness and headaches.  Endo/Heme/Allergies: Negative.   Psychiatric/Behavioral:  Positive for depression and suicidal  ideas. Negative for hallucinations and substance abuse. The patient is nervous/anxious and has insomnia.     Blood pressure (!) 147/88, pulse 89, temperature 98 F (36.7 C), temperature source Oral, resp. rate 19. There is no height or weight on file to calculate BMI.  Past Psychiatric History: History of bipolar disorder, ADHD, and depression.  No previous inpatient psychiatric admission.  No history of past suicide attempts.  Is the patient at risk to self? Yes  Has the patient been a risk to self in the past 6 months? Yes .    Has the patient been a risk to self within the distant past? Yes   Is the patient a risk to others? No   Has the patient been a risk to others in the past 6 months?  No   Has the patient been a risk to others within the distant past? No   Past Medical History: History of neck surgery, eye surgery and wrist surgery.  Patient has prosthetic right eye.  Family History: Patient father has history of hypertension, mother has history of diabetes,  maternal grandmother has history of bipolar disorder and, and sister has history of depression and anxiety.  Social History: Patient denies drinking alcohol, drug use, smoking, or marijuana use.  Last Labs:  Procedure visit on 04/10/2024  Component Date Value Ref Range Status   SURGICAL PATHOLOGY 04/10/2024    Final-Edited                   Value:SURGICAL PATHOLOGY [facility] [address] [place], [place] [address] Telephone [phone] or [phone] Fax [phone]  REPORT OF SURGICAL PATHOLOGY   Accession #: [id number] Patient Name: Mark Fritz, Mark Fritz Visit # : [id number]  MRN: [id number] Physician: Stacia Hamilton DOB/Age 05-Mar-1986 (Age: 4) Gender: M Collected Date: 04/10/2024 Received Date: 04/13/2024  FINAL DIAGNOSIS       1. Surgical [P], colon, transverse, polyp (1) :       -  HYPERPLASTIC POLYP WITH SOME FEATURES OF A SESSILE SERRATED LESION.       2. Surgical [P], colon, rectum, polyp (1) :       -  HYPERPLASTIC POLYP WITH PROLAPSE EFFECT.       ELECTRONIC SIGNATURE : Legolvan Do, Mark, Pathologist, Electronic Signature  MICROSCOPIC DESCRIPTION  CASE COMMENTS STAINS USED IN DIAGNOSIS: H&E H&E    CLINICAL HISTORY  SPECIMEN(S) OBTAINED 1. Surgical [P], Colon, Transverse, Polyp (1) 2. Surgical [P], Colon, Rectum, Polyp (1)  SPECIMEN COM                         MENTS: 1. Perianal abscess; hematochezia; constipation, unspecified constipation type; rectal bleeding; benign neoplasm of transverse colon; benign neoplasm of rectum and anal canal SPECIMEN CLINICAL INFORMATION: 1. R/O adenoma 2. R/O adenoma    Gross Description 1.  Received in formalin are tan, soft tissue fragments that are submitted in toto.Number: 1 Size: 0.4 cm, (1B) ( TA ) 2. Received in formalin are tan, soft tissue fragments that are submitted in toto.Number: 1 Size: 0.4 cm, (1B) ( TA )        Report signed out from the following location(s) [place] [address] [place], [place] [address] CLIA #: [id number]  [facility] [address] [place], [place] [address] CLIA #: [id number]   Hospital Outpatient Visit on 03/20/2024  Component Date Value Ref Range Status   WBC 03/20/2024  6.6  4.0 - 10.5 K/uL Final   RBC 03/20/2024 5.42  4.22 - 5.81 MIL/uL Final   Hemoglobin 03/20/2024 15.3  13.0 - 17.0 g/dL Final   HCT 88/78/7974 44.9  39.0 - 52.0 % Final   MCV 03/20/2024 82.8  80.0 - 100.0 fL Final   MCH 03/20/2024 28.2  26.0 - 34.0 pg Final   MCHC 03/20/2024 34.1  30.0 - 36.0 g/dL Final   RDW 88/78/7974 12.7  11.5 - 15.5 % Final   Platelets 03/20/2024 292  150 - 400 K/uL Final   nRBC 03/20/2024 0.0  0.0 - 0.2 % Final   Performed at [facility], [address], [place], [place] [address]    Allergies: Bupropion   Medications:  PTA Medications  Medication Sig   sodium chloride  (OCEAN) 0.65 % SOLN nasal spray Place 1 spray into both nostrils as needed for congestion.   lithium  carbonate 300 MG capsule Take 600 mg by mouth at bedtime.   lamoTRIgine  (LAMICTAL ) 100 MG tablet Take 100 mg by mouth daily.   hydrOXYzine  (ATARAX ) 10 MG tablet Take 10 mg by mouth at bedtime as needed (sleep).   Medical Decision Making  Patient does not meet the criteria for outpatient admission for discharge at this time and thus is recommended for inpatient psychiatric treatment  evaluation and treatment.   Recommendations  Based on my evaluation the patient appears to have an emergency medical condition for which I recommend the patient be admitted to inpatient psychiatric hospitalization for further  psychiatric evaluation and treatment  Mark JAYSON Azure, FNP 06/05/24  10:33 AM

## 2024-06-05 NOTE — BH Assessment (Signed)
 Comprehensive Clinical Assessment (CCA) Note  06/05/2024 Mark Fritz [id number]  Disposition: Per Ellouise Azure, NP patient is recommended for inpatient treatment.   The patient demonstrates the following risk factors for suicide: Chronic risk factors for suicide include: psychiatric disorder of Bipolar 2. Acute risk factors for suicide include: family or marital conflict and social withdrawal/isolation. Protective factors for this patient include: positive therapeutic relationship, responsibility to others (children, family), and coping skills. Considering these factors, the overall suicide risk at this point appears to be high. Patient is not appropriate for outpatient follow up.  Mark Fritz is a 39 year old male presenting to [facility] accompanied by his wife. Pt states he has been having ongoing thoughts of, wanting to die. Pt states that he had a plan to end his life a week ago [place]. Pt reports inconsistent moods towards his family that he is trying to act like he should burn bridges with loved ones. Pt states he has not had any past suicide attmepts. Pt also denies self harming at this time. Pt denies substance use, Hi, NSSIB and AVH. Pt mentions he has a Bipolar 2 diagnosis. Pt states he is taking Lithium  and Lamictal  as prescribed. Pt states that a lot of his suicidal thoughts come from stress at work and home life with his wife. He states, Sometimes I feel like nothing. Pt states he is currently looking for a therapist at this time.   Patient is a 39 year old male who presents voluntarily to [facility] accompanied by his wife Mark Fritz) due to ongoing suicidal ideations with a plan in but no intent.  Patient states that his plan was to drown himself in a freezing cold pond.  Patient reports he recently started walking towards the pond to end his life but was distracted as he walked towards the pond.  He said this was the closest that he has ever come to actually  attempted to end his life   .  Patient denies any HI or visual hallucinations.  He reports that he has auditory hallucinations occasionally as he is attempting to go to sleep at night.  Patient's states that he often hears someone calling his name.  Patient denies any alcohol or substance use.  He states he was prescribed opiates after having neck surgery but realized it was not safe and gave them to his wife to monitor.  Patient's wife Mark Fritz who was also present during the assessment states that when patient is having these down moments she always likes the way she denies any sharp objects and she is in control of medication.  Patient reports symptoms of depression to include sadness, poor sleep, change in appetite, where he either does not eat or when he eats or indulges.  Patient also reports feelings of hopelessness, worthlessness, difficulty concentrating.  He has often become neglectful and his hygiene.  And isolates himself.  Patient reports having some anxiety as he is often tense due to feeling like he is unable to fulfill his job.  Patient is an pensions consultant for [employer].  Patient reports.diagnosis of major depressive disorder in 2017 and ADHD in 2013, however he reports that he was diagnosed recently with bipolar 2 disorder.  He engages in psychiatric services seeing Harlene Pepper with [facility] for medication management and Silvano Pacini at [facility] for therapy.  During assessment patient was sitting on couch with his wife alert and oriented x 4.  Patient's speech is clear and coherent with normal  tone and volume.  Patient's mood was depressed and he was tearful throughout this assessment.  Patient's thought process is coherent and goal directed.  There is no indication that the patient is currently responding to internal stimuli or experiencing delusional thought content at this time patient was cooperative throughout assessment   Chief Complaint:  Chief Complaint  Patient presents with    Suicidal Ideation    Visit Diagnosis: Bipolar 2 Disorder, depressed                              Suicidal ideation   CCA Screening, Triage and Referral (STR)  Patient Reported Information How did you hear about us ? Family/Friend  What Is the Reason for Your Visit/Call Today? Mark Fritz is a 39 year old male presenting to [facility] accompanied by his wife. Pt states he has been having ongoing thoughts of, wanting to die. Pt states that he had a plan to end his life a week ago [place]. Pt reports inconsistent moods towards his family that he is trying to act like he should burn bridges with loved ones. Pt states he has not had any past suicide attmepts. Pt also denies self harming at this time. Pt denies substance use, Hi, NSSIB and AVH. Pt mentions he has a Bipolar 2 diagnosis. Pt states he is taking Lithium  and Lamictal  as prescribed. Pt states that a lot of his suicidal thoughts come from stress at work and home life with his wife. He states, Sometimes I feel like nothing. Pt states he is currently looking for a therapist at this time.  How Long Has This Been Causing You Problems? 1 wk - 1 month  What Do You Feel Would Help You the Most Today? Stress Management; Treatment for Depression or other mood problem   Have You Recently Had Any Thoughts About Hurting Yourself? Yes  Are You Planning to Commit Suicide/Harm Yourself At This time? No   Flowsheet Row ED from 06/05/2024 in [facility] ED from 11/23/2023 in [facility] Emergency Department at [facility] ED from 10/13/2022 in [facility] Emergency Department at [facility]  C-SSRS RISK CATEGORY High Risk No Risk High Risk    Have you Recently Had Thoughts About Hurting Someone [name]? No  Are You Planning to Harm Someone at This Time? No  Explanation: N/A   Have You Used Any Alcohol or Drugs in the Past 24 Hours? No  How Long Ago Did You Use Drugs or  Alcohol? No data recorded What Did You Use and How Much? No data recorded  Do You Currently Have a Therapist/Psychiatrist? Yes  Name of Therapist/Psychiatrist: Name of Therapist/Psychiatrist: Pt sees Harlene Pepper @ [facility] for medication  management and Silvano Pacini @ [facility] for therapy   Have You Been Recently Discharged From Any Office Practice or Programs? No  Explanation of Discharge From Practice/Program: No data recorded    CCA Screening Triage Referral Assessment Type of Contact: Face-to-Face  Telemedicine Service Delivery:   Is this Initial or Reassessment?   Date Telepsych consult ordered in [facility]:    Time Telepsych consult ordered in [facility]:    Location of Assessment: [facility] Assessment [facility]  Provider Location: [facility] Assessment Services   Collateral Involvement: Wife Hailey   Does Patient Have a Automotive Engineer Guardian? -- (N/A)  Legal Guardian Contact Information: N/A  Copy of Legal Guardianship Form: -- (  N/A)  Legal Guardian Notified of Arrival: -- (N/A)  Legal Guardian Notified of Pending Discharge: -- (N/A)  If Minor and Not Living with Parent(s), Who has Custody? N/A  Is CPS involved or ever been involved? Never  Is APS involved or ever been involved? No data recorded  Patient Determined To Be At Risk for Harm To Self or Others Based on Review of Patient Reported Information or Presenting Complaint? Yes, for Self-Harm  Method: Plan without intent  Availability of Means: Has close by  Intent: Vague intent or NA  Notification Required: No need or identified person  Additional Information for Danger to Others Potential: -- (N/A)  Additional Comments for Danger to Others Potential: N/A  Are There Guns or Other Weapons in Your Home? No  Types of Guns/Weapons: Denies  Are These Weapons Safely Secured?                            -- (N/A)  Who Could Verify You Are Able To Have These Secured: Wife confirmed that there were no guns in  the home nd hat she will hide knives and othe sharp objects when he is feeling depressed.  Do You Have any Outstanding Charges, Pending Court Dates, Parole/Probation? Pt denies  Contacted To Inform of Risk of Harm To Self or Others: -- (N/A)    Does Patient Present under Involuntary Commitment? No data recorded   [place] of Residence: [place]   Patient Currently Receiving the Following Services: Individual Therapy; Medication Management   Determination of Need: Urgent (48 hours)   Options For Referral: Inpatient Hospitalization; Intensive Outpatient Therapy; Medication Management; Outpatient Therapy     CCA Biopsychosocial Patient Reported Schizophrenia/Schizoaffective Diagnosis in Past: No   Strengths: Smart and wanted to seek services   Mental Health Symptoms Depression:  Change in energy/activity; Difficulty Concentrating; Hopelessness; Increase/decrease in appetite; Sleep (too much or little); Tearfulness; Worthlessness   Duration of Depressive symptoms: Duration of Depressive Symptoms: Greater than two weeks   Mania:  None   Anxiety:   Difficulty concentrating; Irritability; Restlessness; Sleep; Tension; Worrying; Fatigue   Psychosis:  Hallucinations   Duration of Psychotic symptoms: Duration of Psychotic Symptoms: Less than six months   Trauma:  None   Obsessions:  None   Compulsions:  None   Inattention:  Fails to pay attention/makes careless mistakes   Hyperactivity/Impulsivity:  Feeling of restlessness; Fidgets with hands/feet   Oppositional/Defiant Behaviors:  N/A   Emotional Irregularity:  Mood lability; Recurrent suicidal behaviors/gestures/threats; Potentially harmful impulsivity   Other Mood/Personality Symptoms:  N/A    Mental Status Exam Appearance and self-care  Stature:  Average   Weight:  Average weight   Clothing:  Casual   Grooming:  Normal   Cosmetic use:  None   Posture/gait:  Normal   Motor activity:  Not Remarkable    Sensorium  Attention:  Normal   Concentration:  Normal   Orientation:  Person; Place; Situation; Time   Recall/memory:  Normal   Affect and Mood  Affect:  Depressed; Tearful   Mood:  Depressed; Hopeless   Relating  Eye contact:  Normal   Facial expression:  Responsive   Attitude toward examiner:  Cooperative   Thought and Language  Speech flow: Clear and Coherent   Thought content:  Appropriate to Mood and Circumstances   Preoccupation:  Suicide   Hallucinations:  None   Organization:  Coherent; Development Worker, [employer] of Knowledge:  Average   Intelligence:  Average   Abstraction:  Normal   Judgement:  Fair   Reality Testing:  Adequate   Insight:  Fair   Decision Making:  Impulsive   Social Functioning  Social Maturity:  Impulsive   Social Judgement:  Normal   Stress  Stressors:  Family conflict; Work; Other (Comment) (Worries about his mental health)   Coping Ability:  Overwhelmed   Skill Deficits:  Self-care; Interpersonal   Supports:  Friends/Service system; Church     Religion: Religion/Spirituality Are You A Religious Person?: Yes What is Your Religious Affiliation?: Mormon How Might This Affect Treatment?: N/A  Leisure/Recreation: Leisure / Recreation Do You Have Hobbies?: Yes Leisure and Hobbies: Patient is seated in chair cooperative mood enjoys being outside in nature enjoys being outside in nature he particularly enjoys hiking  Exercise/Diet: Exercise/Diet Do You Exercise?: No Have You Gained or Lost A Significant Amount of Weight in the Past Six Months?: No Do You Follow a Special Diet?: No Do You Have Any Trouble Sleeping?: Yes Explanation of Sleeping Difficulties: Patient is either sleeping too much or does not sleep at all   CCA Employment/Education Employment/Work Situation: Employment / Work Situation Employment Situation: Employed Work Stressors: 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 Patient's Job has Been Impacted by Current Illness: Yes Describe how Patient's Job has Been Impacted: Patient reports that he is making more careless mistakes than normal Has Patient ever Been in the Military?: No  Education: Education Is Patient Currently Attending School?: No Last Grade Completed: 12 Did You Attend College?: Yes What Type of College Degree Do you Have?: [employer] Did You Have An Individualized Education Program (IIEP): No Did You Have Any Difficulty At School?: No Patient's Education Has Been Impacted by Current Illness: No   CCA Family/Childhood History Family and Relationship History: Family history Marital status: Married What types of issues is patient dealing with in the relationship?: none reported Additional relationship information: N/A Does patient have children?: Yes How many children?: 3 How is patient's relationship with their children?: good  Childhood History:  Childhood History By whom was/is the patient raised?: Both parents Did patient suffer any verbal/emotional/physical/sexual abuse as a child?: No Did patient suffer from severe childhood neglect?: No Has patient ever been sexually abused/assaulted/raped as an adolescent or adult?: No Was the patient ever a victim of a crime or a disaster?: No Witnessed domestic violence?: No Has patient been affected by domestic violence as an adult?: No       CCA Substance Use Alcohol/Drug Use: Alcohol / Drug Use Pain Medications: see MAR Prescriptions: see MAR Over the Counter: see MAR History of alcohol / drug use?: No history of alcohol / drug abuse Longest period of sobriety (when/how long): N/A Negative Consequences of Use:  (N/A) Withdrawal Symptoms:  (N/A)                         ASAM's:  Six Dimensions of Multidimensional Assessment  Dimension 1:  Acute Intoxication and/or Withdrawal Potential:   Dimension 1:  Description of individual's past and current experiences  of substance use and withdrawal: N/A  Dimension 2:  Biomedical Conditions and Complications:   Dimension 2:  Description of patient's biomedical conditions and  complications: N/A  Dimension 3:  Emotional, Behavioral, or Cognitive Conditions and Complications:  Dimension 3:  Description of emotional, behavioral, or cognitive conditions and complications: N/A  Dimension 4:  Readiness to Change:  Dimension 4:  Description of Readiness to Change criteria: N/A  Dimension 5:  Relapse, Continued use, or Continued Problem Potential:  Dimension 5:  Relapse, continued use, or continued problem potential critiera description: N/A  Dimension 6:  Recovery/Living Environment:  Dimension 6:  Recovery/Iiving environment criteria description: N/A  ASAM Severity Score:    ASAM Recommended Level of Treatment:     Substance use Disorder (SUD) Substance Use Disorder (SUD)  Checklist Symptoms of Substance Use:  (N/A)  Recommendations for Services/Supports/Treatments: Recommendations for Services/Supports/Treatments Recommendations For Services/Supports/Treatments:  (N/A)  Disposition Recommendation per psychiatric provider: We recommend inpatient psychiatric hospitalization when medically cleared. Patient is under voluntary admission at this time.   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 03/30/2022   FH: CAD (coronary artery disease) 03/30/2022   Inguinal hernia, left 09/29/2021   MDD (major depressive disorder), recurrent severe,  without psychosis (HCC) 01/06/2021   Primary osteoarthritis of right hand 01/01/2020   Right wrist pain 12/09/2019   Major depressive disorder, recurrent episode, moderate (HCC) 02/19/2018   Numbness 12/23/2017   Chronic idiopathic constipation 03/07/2017   Fitting or adjustment of artificial eye 10/27/2013   ADHD (attention deficit hyperactivity disorder) 10/23/2013   History of bunionectomy of left great toe 2008     Referrals to Alternative Service(s): Referred to Alternative Service(s):   Place:   Date:   Time:    Referred to Alternative Service(s):   Place:   Date:   Time:    Referred to Alternative Service(s):   Place:   Date:   Time:    Referred to Alternative Service(s):   Place:   Date:   Time:     Lianne JINNY Shuck, LCSW

## 2024-06-05 NOTE — Group Note (Unsigned)
 Group Topic: Balance in Life  Group Date: 06/05/2024 Start Time: 1230 End Time: 1250 Facilitators: Deidre Prentis CROME, NT  Department: [facility]  Number of Participants: 5  Group Focus: affirmation Treatment Modality:  Psychoeducation Interventions utilized were support Purpose: reinforce self-care   Name: Mark Fritz Date of Birth: 26-Jul-1985  MR: [id number]    Level of Participation: {THERAPIES; PSYCH GROUP PARTICIPATION OZCZO:76008} Quality of Participation: {THERAPIES; PSYCH QUALITY OF PARTICIPATION:23992} Interactions with others: {THERAPIES; PSYCH INTERACTIONS:23993} Mood/Affect: {THERAPIES; PSYCH MOOD/AFFECT:23994} Triggers (if applicable): *** Cognition: {THERAPIES; PSYCH COGNITION:23995} Progress: {THERAPIES; PSYCH PROGRESS:23997} Response: *** Plan: {THERAPIES; PSYCH EOJW:76003}  Patients Problems:  Patient Active Problem List   Diagnosis Date Noted   Hyperlipidemia 04/07/2023   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   Laceration of blood vessel of left little finger 02/19/2023   Elevated blood pressure reading 10/17/2022   DJD (degenerative joint disease) of cervical spine 10/16/2022   Rectal abscess 10/09/2022   Arthritis 08/25/2022   Blindness of right eye 08/10/2022   Rectal bleeding 06/29/2022   Change in bowel habits 06/29/2022   Irregular bowel habits 06/29/2022   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

## 2024-06-05 NOTE — ED Notes (Signed)
 Pt sitting in dayroom watching television and interacting with peers. No acute distress noted. No concerns voiced. Informed pt to notify staff with any needs or assistance. Pt verbalized understanding and agreement. Will continue to monitor for safety.

## 2024-06-05 NOTE — Group Note (Signed)
 Date:  06/05/2024 Time:  8:55 PM  Group Topic/Focus:  Identifying Needs:   The focus of this group is to help patients identify their personal needs that have been historically problematic and identify healthy behaviors to address their needs. Making Healthy Choices:   The focus of this group is to help patients identify negative/unhealthy choices they were using prior to admission and identify positive/healthier coping strategies to replace them upon discharge. Managing Feelings:   The focus of this group is to identify what feelings patients have difficulty handling and develop a plan to handle them in a healthier way upon discharge.    Participation Level:  Active  Participation Quality:  Appropriate  Affect:  Appropriate  Cognitive:  Appropriate  Insight: Appropriate  Engagement in Group:  Engaged  Modes of Intervention:  Discussion  Additional Comments:    Nimisha Rathel L 06/05/2024, 8:55 PM

## 2024-06-05 NOTE — Discharge Instructions (Signed)
 Patient is accepted to [facility] unit

## 2024-06-05 NOTE — ED Notes (Signed)
 Patient A&O x 4, ambulatory. Patient discharged in no acute distress. Patient endorses SI and, A/H upon discharge. Patient verbalized understanding of all discharge instructions reviewed on AVS via staff, to include follow up appointments, RX's and safety. Suicide safety plan completed and reviewed with clinical research associate. A copy given to pt. Patient escorted to lobby via staff for transport to destination. Safety maintained.

## 2024-06-19 ENCOUNTER — Encounter (HOSPITAL_COMMUNITY): Payer: Self-pay | Admitting: Anesthesiology

## 2024-06-19 ENCOUNTER — Encounter (HOSPITAL_COMMUNITY): Admission: RE | Payer: Self-pay | Source: Ambulatory Visit

## 2024-06-19 ENCOUNTER — Ambulatory Visit (HOSPITAL_COMMUNITY): Admission: RE | Admit: 2024-06-19 | Payer: Self-pay | Source: Ambulatory Visit | Admitting: General Surgery
